# Patient Record
Sex: Male | Born: 1950
Health system: Southern US, Community
[De-identification: ages and names within clinical notes are randomized; demographics above are authoritative.]

## PROBLEM LIST (undated history)

## (undated) DIAGNOSIS — I2510 Atherosclerotic heart disease of native coronary artery without angina pectoris: Secondary | ICD-10-CM

## (undated) DIAGNOSIS — E785 Hyperlipidemia, unspecified: Secondary | ICD-10-CM

## (undated) DIAGNOSIS — S42309A Unspecified fracture of shaft of humerus, unspecified arm, initial encounter for closed fracture: Secondary | ICD-10-CM

## (undated) DIAGNOSIS — R55 Syncope and collapse: Secondary | ICD-10-CM

## (undated) DIAGNOSIS — R001 Bradycardia, unspecified: Secondary | ICD-10-CM

## (undated) DIAGNOSIS — K635 Polyp of colon: Secondary | ICD-10-CM

## (undated) HISTORY — DX: Bradycardia, unspecified: R00.1

## (undated) HISTORY — DX: Unspecified fracture of shaft of humerus, unspecified arm, initial encounter for closed fracture: S42.309A

## (undated) HISTORY — PX: APPENDECTOMY: SHX54

## (undated) HISTORY — DX: Polyp of colon: K63.5

## (undated) HISTORY — DX: Hyperlipidemia, unspecified: E78.5

## (undated) HISTORY — DX: Syncope and collapse: R55

## (undated) HISTORY — DX: Atherosclerotic heart disease of native coronary artery without angina pectoris: I25.10

## (undated) HISTORY — PX: CORONARY ANGIOPLASTY: SHX604

---

## 2002-04-19 ENCOUNTER — Encounter: Payer: Self-pay | Admitting: Internal Medicine

## 2004-09-09 ENCOUNTER — Ambulatory Visit: Payer: Self-pay | Admitting: Internal Medicine

## 2004-09-09 ENCOUNTER — Encounter (INDEPENDENT_AMBULATORY_CARE_PROVIDER_SITE_OTHER): Payer: Self-pay | Admitting: *Deleted

## 2004-09-09 LAB — CONVERTED CEMR LAB: PSA: 0.34 ng/mL

## 2004-09-17 ENCOUNTER — Ambulatory Visit: Payer: Self-pay | Admitting: Internal Medicine

## 2004-10-13 ENCOUNTER — Ambulatory Visit: Payer: Self-pay | Admitting: Internal Medicine

## 2005-09-29 ENCOUNTER — Ambulatory Visit: Payer: Self-pay | Admitting: Internal Medicine

## 2005-09-29 ENCOUNTER — Observation Stay (HOSPITAL_COMMUNITY): Admission: EM | Admit: 2005-09-29 | Discharge: 2005-10-02 | Payer: Self-pay | Admitting: Emergency Medicine

## 2005-09-30 ENCOUNTER — Ambulatory Visit: Payer: Self-pay | Admitting: Internal Medicine

## 2005-09-30 ENCOUNTER — Encounter: Payer: Self-pay | Admitting: Internal Medicine

## 2005-10-13 ENCOUNTER — Encounter (HOSPITAL_COMMUNITY): Admission: RE | Admit: 2005-10-13 | Discharge: 2005-10-30 | Payer: Self-pay | Admitting: Cardiovascular Disease

## 2005-10-24 ENCOUNTER — Ambulatory Visit: Payer: Self-pay | Admitting: Internal Medicine

## 2005-10-25 ENCOUNTER — Ambulatory Visit: Payer: Self-pay | Admitting: Internal Medicine

## 2005-10-26 ENCOUNTER — Ambulatory Visit: Payer: Self-pay | Admitting: Internal Medicine

## 2006-02-13 ENCOUNTER — Ambulatory Visit: Payer: Self-pay | Admitting: Internal Medicine

## 2006-02-13 LAB — CONVERTED CEMR LAB
Albumin: 4.3 g/dL (ref 3.5–5.2)
Alkaline Phosphatase: 73 units/L (ref 39–117)
Total Protein: 7 g/dL (ref 6.0–8.3)

## 2006-05-04 ENCOUNTER — Ambulatory Visit: Payer: Self-pay | Admitting: Internal Medicine

## 2006-08-29 ENCOUNTER — Ambulatory Visit: Payer: Self-pay | Admitting: Internal Medicine

## 2006-09-14 ENCOUNTER — Ambulatory Visit: Payer: Self-pay | Admitting: Internal Medicine

## 2006-09-21 ENCOUNTER — Ambulatory Visit: Payer: Self-pay | Admitting: Internal Medicine

## 2006-09-21 LAB — CONVERTED CEMR LAB
ALT: 28 units/L (ref 0–53)
Chloride: 103 meq/L (ref 96–112)
Cholesterol: 107 mg/dL (ref 0–200)
Creatinine, Ser: 0.9 mg/dL (ref 0.4–1.5)
Glucose, Bld: 95 mg/dL (ref 70–99)
HDL: 31.7 mg/dL — ABNORMAL LOW (ref >39.0)
Potassium: 4.5 meq/L (ref 3.5–5.1)
Sodium: 139 meq/L (ref 135–145)
Triglycerides: 70 mg/dL (ref 0–149)
VLDL: 14 mg/dL (ref 0–40)

## 2007-04-28 ENCOUNTER — Encounter: Payer: Self-pay | Admitting: *Deleted

## 2007-04-28 DIAGNOSIS — Z9089 Acquired absence of other organs: Secondary | ICD-10-CM | POA: Insufficient documentation

## 2007-04-28 DIAGNOSIS — S42309A Unspecified fracture of shaft of humerus, unspecified arm, initial encounter for closed fracture: Secondary | ICD-10-CM | POA: Insufficient documentation

## 2007-04-28 DIAGNOSIS — E785 Hyperlipidemia, unspecified: Secondary | ICD-10-CM | POA: Insufficient documentation

## 2007-04-28 DIAGNOSIS — I2510 Atherosclerotic heart disease of native coronary artery without angina pectoris: Secondary | ICD-10-CM | POA: Insufficient documentation

## 2007-06-14 ENCOUNTER — Ambulatory Visit: Payer: Self-pay | Admitting: Internal Medicine

## 2007-06-26 ENCOUNTER — Telehealth (INDEPENDENT_AMBULATORY_CARE_PROVIDER_SITE_OTHER): Payer: Self-pay | Admitting: *Deleted

## 2007-07-05 ENCOUNTER — Ambulatory Visit: Payer: Self-pay | Admitting: Internal Medicine

## 2007-07-05 ENCOUNTER — Encounter: Payer: Self-pay | Admitting: Internal Medicine

## 2007-07-05 DIAGNOSIS — K635 Polyp of colon: Secondary | ICD-10-CM

## 2007-07-05 HISTORY — DX: Polyp of colon: K63.5

## 2007-07-08 ENCOUNTER — Encounter: Payer: Self-pay | Admitting: Internal Medicine

## 2007-07-10 DIAGNOSIS — Z860100 Personal history of colon polyps, unspecified: Secondary | ICD-10-CM | POA: Insufficient documentation

## 2007-07-26 ENCOUNTER — Encounter: Payer: Self-pay | Admitting: Internal Medicine

## 2007-10-23 ENCOUNTER — Ambulatory Visit: Payer: Self-pay | Admitting: Internal Medicine

## 2007-10-23 LAB — CONVERTED CEMR LAB
Albumin: 4.1 g/dL (ref 3.5–5.2)
BUN: 16 mg/dL (ref 6–23)
Bacteria, UA: NEGATIVE
Basophils Absolute: 0.1 10*3/uL (ref 0.0–0.1)
Bilirubin Urine: NEGATIVE
Chloride: 110 meq/L (ref 96–112)
Cholesterol: 117 mg/dL (ref 0–200)
Creatinine, Ser: 1 mg/dL (ref 0.4–1.5)
Crystals: NEGATIVE
Eosinophils Absolute: 0.5 10*3/uL (ref 0.0–0.7)
Eosinophils Relative: 7.5 % — ABNORMAL HIGH (ref 0.0–5.0)
GFR calc Af Amer: 99 mL/min
GFR calc non Af Amer: 82 mL/min
HCT: 42.4 % (ref 39.0–52.0)
HDL: 33.3 mg/dL — ABNORMAL LOW (ref >39.0)
Hemoglobin, Urine: NEGATIVE
Ketones, ur: NEGATIVE mg/dL
LDL Cholesterol: 68 mg/dL (ref 0–99)
MCHC: 34.9 g/dL (ref 30.0–36.0)
MCV: 93 fL (ref 78.0–100.0)
Monocytes Absolute: 0.7 10*3/uL (ref 0.1–1.0)
Mucus, UA: NEGATIVE
Neutrophils Relative %: 53.6 % (ref 43.0–77.0)
PSA: 0.25 ng/mL (ref 0.10–4.00)
Platelets: 236 10*3/uL (ref 150–400)
Potassium: 4.7 meq/L (ref 3.5–5.1)
RDW: 12.5 % (ref 11.5–14.6)
Total Bilirubin: 1.1 mg/dL (ref 0.3–1.2)
Total Protein, Urine: NEGATIVE mg/dL
Triglycerides: 81 mg/dL (ref 0–149)
Urine Glucose: NEGATIVE mg/dL
Urobilinogen, UA: 0.2 (ref 0.0–1.0)
VLDL: 16 mg/dL (ref 0–40)
WBC: 6.5 10*3/uL (ref 4.5–10.5)

## 2007-10-26 ENCOUNTER — Ambulatory Visit: Payer: Self-pay | Admitting: Internal Medicine

## 2008-02-08 ENCOUNTER — Telehealth: Payer: Self-pay | Admitting: Internal Medicine

## 2008-02-22 ENCOUNTER — Encounter: Payer: Self-pay | Admitting: Internal Medicine

## 2008-04-22 ENCOUNTER — Encounter: Payer: Self-pay | Admitting: Internal Medicine

## 2008-04-22 ENCOUNTER — Ambulatory Visit: Payer: Self-pay | Admitting: Internal Medicine

## 2008-07-08 ENCOUNTER — Telehealth: Payer: Self-pay | Admitting: Internal Medicine

## 2008-07-10 ENCOUNTER — Ambulatory Visit: Payer: Self-pay | Admitting: Internal Medicine

## 2008-07-10 LAB — CONVERTED CEMR LAB
ALT: 24 units/L (ref 0–53)
AST: 27 units/L (ref 0–37)
Albumin: 4.1 g/dL (ref 3.5–5.2)
Cholesterol: 170 mg/dL (ref 0–200)
Direct LDL: 107.3 mg/dL
HDL: 32 mg/dL — ABNORMAL LOW (ref >39.00)
Total Protein: 7.2 g/dL (ref 6.0–8.3)
Triglycerides: 212 mg/dL — ABNORMAL HIGH (ref 0.0–149.0)

## 2008-07-15 ENCOUNTER — Encounter: Payer: Self-pay | Admitting: Internal Medicine

## 2009-02-09 ENCOUNTER — Telehealth: Payer: Self-pay | Admitting: Internal Medicine

## 2009-02-26 ENCOUNTER — Ambulatory Visit: Payer: Self-pay | Admitting: Internal Medicine

## 2009-02-26 LAB — CONVERTED CEMR LAB
AST: 28 units/L (ref 0–37)
Albumin: 4.3 g/dL (ref 3.5–5.2)
Basophils Absolute: 0 10*3/uL (ref 0.0–0.1)
Bilirubin Urine: NEGATIVE
CO2: 30 meq/L (ref 19–32)
Chloride: 105 meq/L (ref 96–112)
Cholesterol: 163 mg/dL (ref 0–200)
GFR calc non Af Amer: 81.07 mL/min (ref >60)
Glucose, Bld: 81 mg/dL (ref 70–99)
HCT: 42.9 % (ref 39.0–52.0)
Hemoglobin: 14.5 g/dL (ref 13.0–17.0)
Ketones, ur: NEGATIVE mg/dL
Leukocytes, UA: NEGATIVE
Lymphs Abs: 1.6 10*3/uL (ref 0.7–4.0)
MCHC: 33.7 g/dL (ref 30.0–36.0)
MCV: 92.8 fL (ref 78.0–100.0)
Monocytes Absolute: 0.6 10*3/uL (ref 0.1–1.0)
Monocytes Relative: 11.1 % (ref 3.0–12.0)
Neutro Abs: 3.3 10*3/uL (ref 1.4–7.7)
Potassium: 4.7 meq/L (ref 3.5–5.1)
RDW: 12.4 % (ref 11.5–14.6)
Sodium: 143 meq/L (ref 135–145)
Specific Gravity, Urine: 1.02 (ref 1.000–1.030)
TSH: 1.12 microintl units/mL (ref 0.35–5.50)
Urobilinogen, UA: 0.2 (ref 0.0–1.0)

## 2009-03-03 ENCOUNTER — Ambulatory Visit: Payer: Self-pay | Admitting: Internal Medicine

## 2009-04-27 ENCOUNTER — Ambulatory Visit: Payer: Self-pay | Admitting: Internal Medicine

## 2009-04-27 DIAGNOSIS — R55 Syncope and collapse: Secondary | ICD-10-CM | POA: Insufficient documentation

## 2009-07-28 ENCOUNTER — Telehealth: Payer: Self-pay | Admitting: Internal Medicine

## 2009-11-06 ENCOUNTER — Encounter: Payer: Self-pay | Admitting: Internal Medicine

## 2009-11-12 ENCOUNTER — Ambulatory Visit: Payer: Self-pay | Admitting: Internal Medicine

## 2010-02-23 ENCOUNTER — Telehealth: Payer: Self-pay | Admitting: Internal Medicine

## 2010-03-01 ENCOUNTER — Telehealth: Payer: Self-pay | Admitting: Internal Medicine

## 2010-03-03 NOTE — Letter (Signed)
Summary: Medical Report/NCDMV  Medical Report/NCDMV   Imported By: Sherian Rein 11/23/2009 13:14:21  _____________________________________________________________________  External Attachment:    Type:   Image     Comment:   External Document

## 2010-03-03 NOTE — Assessment & Plan Note (Signed)
Summary: yearly/sl   Primary Provider:  Illene Regulus  CC:  yearly.  History of Present Illness: Jeremy [name] is seen following stenting of his LAD with a Taxus stent; this occurred in the context of an evaluation for syncope while driving.   His head no exertional chest discomfort. He did have some episodes of chest pain the last 2 weeks which were occurred in the context of a GI syndrome characterized by fever nausea and some loose stool.  Reviewing his laboratories his HDL on Crestor has increased to over 40(hooray)   Current Medications (verified): 1)  Plavix 75 Mg  Tabs (Clopidogrel Bisulfate) .... Take Once Daily 2)  Aspirin 81 Mg  Tabs (Aspirin) .... Take Once Daily 3)  Flonase 50 Mcg/act  Susp (Fluticasone Propionate) .... Use Daily As Directed. 4)  Advil 200 Mg Tabs (Ibuprofen) .... As Needed 5)  Crestor 10 Mg Tabs (Rosuvastatin Calcium) .Marland Kitchen.. 1 Once Daily  Allergies (verified): No Known Drug Allergies  Past History:  Past Medical History: Last updated: 03/03/2009 MYOCARDIAL INFARCTION, HX OF (ICD-412) SYNCOPE, HX OF (ICD-V12.49) PERCUTANEOUS TRANSLUMINAL CORONARY ANGIOPLASTY, HX OF (ICD-V45.82) Hx of FRACTURE, ARM (ICD-818.0) CORONARY ARTERY DISEASE (ICD-414.00) HYPERLIPIDEMIA (ICD-272.4)         Past Surgical History: Last updated: 06/14/2007 APPENDECTOMY, HX OF (ICD-V45.79) PERCUTANEOUS TRANSLUMINAL CORONARY ANGIOPLASTY, HX OF (ICD-V45.82)  Family History: Last updated: 10/26/2007 father- 43: prostate cancer, CAD, lipids, DJD hip and back mother - 86: TIAs, lipid Maternal Uncle with colon cancer Neg- DM  Social History: Last updated: 10/26/2007 Jeremy [name] - [employer] [place] Married ''89 No children Occupation: Forensic scientist  Vital Signs:  Patient profile:   60 year old male Height:      71 inches Weight:      196 pounds BMI:     27.44 Pulse rate:   57 / minute BP sitting:   124 / 72  (left arm) Cuff size:   regular  Vitals  Entered By: Judithe Modest CMA (April 27, 2009 4:33 PM)  Physical Exam  General:  The patient was alert and oriented in no acute distress. HEENT Normal.  Neck veins were flat, carotids were brisk.  Lungs were clear.  Heart sounds were regular without murmurs or gallops.  Abdomen was soft with active bowel sounds. There is no clubbing cyanosis or edema. Skin Warm and dry    EKG  Procedure date:  04/27/2009  Findings:      sinus rhythm at 57 Intervals 0.15/0.10/0.40 Axis LIV R. prime V1 and V2 Otherwise normal  Impression & Recommendations:  Problem # 1:  SYNCOPE (ICD-780.2) no recurrent His updated medication list for this problem includes:    Plavix 75 Mg Tabs (Clopidogrel bisulfate) .Marland Kitchen... Take once daily    Aspirin 81 Mg Tabs (Aspirin) .Marland Kitchen... Take once daily  Problem # 2:  MYOCARDIAL INFARCTION, HX OF (ICD-412) the patient is status post MI and previous stenting. He continues on his Plavix. Lipid management under the care of Dr. Debby Bud has been quite exceptional with his HDL now over 40.  With recent episodes of chest pain strongly suggest GI originnotwithstanding his use of the Levine sign. There've ice let us know if they have any more symptoms going forward is Myoview scan would probably be appropriate His updated medication list for this problem includes:    Plavix 75 Mg Tabs (Clopidogrel bisulfate) .Marland Kitchen... Take once daily    Aspirin 81 Mg Tabs (Aspirin) .Marland Kitchen... Take once daily  Orders: EKG w/ Interpretation (93000)  Problem #  3:  MYOCARDIAL INFARCTION, HX OF (ICD-412)  His updated medication list for this problem includes:    Plavix 75 Mg Tabs (Clopidogrel bisulfate) .Marland Kitchen... Take once daily    Aspirin 81 Mg Tabs (Aspirin) .Marland Kitchen... Take once daily  Orders: EKG w/ Interpretation (93000)

## 2010-03-03 NOTE — Progress Notes (Signed)
Summary: Mail order refill  Phone Note Refill Request Message from:  Fax from Pharmacy on July 28, 2009 11:36 AM  Refills Requested: Medication #1:  FLONASE 50 MCG/ACT  SUSP use daily as directed.   Notes: [facility] Initial call taken by: Lucious Groves,  July 28, 2009 11:36 AM    Prescriptions: FLONASE 50 MCG/ACT  SUSP (FLUTICASONE PROPIONATE) use daily as directed.  #3 x 3   Entered by:   Lucious Groves   Authorized by:   Jacques Navy MD   Signed by:   Lucious Groves on 07/28/2009   Method used:   Faxed to ...       [facility])             , [place]         Ph: [phone]       Fax: [phone]   RxID:   [id number]

## 2010-03-03 NOTE — Assessment & Plan Note (Signed)
Summary: CPX /NWS  #   Vital Signs:  Patient profile:   60 year old male Height:      71 inches Weight:      197 pounds BMI:     27.58 O2 Sat:      97 % on Room air Temp:     98.2 degrees F oral Pulse rate:   63 / minute BP sitting:   122 / 84  (left arm) Cuff size:   regular  Vitals Entered By: Bill Salinas CMA (March 03, 2009 2:18 PM)  O2 Flow:  Room air CC: cpx/ pt will get flu shot and tetanus booster today/ ab   Primary Care Provider:  Illene Regulus  CC:  cpx/ pt will get flu shot and tetanus booster today/ ab.  History of Present Illness: Patient presents for routine medical follow-up. IN the interval he has been good with no intercurrent serious illness, surgery or injury. He continues to adhere to his medical regimen and a healthy life-style despite the gain of a mere 1.25 lbs.    Current Medications (verified): 1)  Plavix 75 Mg  Tabs (Clopidogrel Bisulfate) .... Take Once Daily 2)  Aspirin 81 Mg  Tabs (Aspirin) .... Take Once Daily 3)  Flonase 50 Mcg/act  Susp (Fluticasone Propionate) .... Use Daily As Directed. 4)  Advil 200 Mg Tabs (Ibuprofen) .... As Needed 5)  Crestor 10 Mg Tabs (Rosuvastatin Calcium) .Marland Kitchen.. 1 Once Daily  Allergies (verified): No Known Drug Allergies  Past History:  Family History: Last updated: 10/26/2007 father- 1923: prostate cancer, CAD, lipids, DJD hip and back mother - 65: TIAs, lipid Maternal Uncle with colon cancer Neg- DM  Social History: Last updated: 10/26/2007 Patrick North - [employer] [place] Married ''89 No children Occupation: Forensic scientist  Risk Factors: Alcohol Use: <1 (06/14/2007) Caffeine Use: 0 (10/26/2007) Exercise: yes (10/26/2007)  Risk Factors: Smoking Status: quit (06/14/2007)  Past Medical History: MYOCARDIAL INFARCTION, HX OF (ICD-412) SYNCOPE, HX OF (ICD-V12.49) PERCUTANEOUS TRANSLUMINAL CORONARY ANGIOPLASTY, HX OF (ICD-V45.82) Hx of FRACTURE, ARM (ICD-818.0) CORONARY ARTERY DISEASE  (ICD-414.00) HYPERLIPIDEMIA (ICD-272.4)         Past Surgical History: Reviewed history from 06/14/2007 and no changes required. APPENDECTOMY, HX OF (ICD-V45.79) PERCUTANEOUS TRANSLUMINAL CORONARY ANGIOPLASTY, HX OF (ICD-V45.82)  Family History: Reviewed history from 10/26/2007 and no changes required. father- 64: prostate cancer, CAD, lipids, DJD hip and back mother - 1926: TIAs, lipid Maternal Uncle with colon cancer Neg- DM  Social History: Reviewed history from 10/26/2007 and no changes required. [employer] - [employer] [place] Married ''89 No children Occupation: Forensic scientist  Review of Systems  The patient denies anorexia, fever, weight loss, weight gain, vision loss, decreased hearing, hoarseness, chest pain, syncope, dyspnea on exertion, peripheral edema, prolonged cough, headaches, hemoptysis, abdominal pain, melena, hematochezia, severe indigestion/heartburn, hematuria, incontinence, muscle weakness, suspicious skin lesions, transient blindness, difficulty walking, depression, unusual weight change, abnormal bleeding, enlarged lymph nodes, and testicular masses.    Physical Exam  General:  WNWD white male in no distress Head:  Normocephalic and atraumatic without obvious abnormalities. No apparent alopecia or balding. Eyes:  No corneal or conjunctival inflammation noted. EOMI. Perrla. Funduscopic exam benign, without hemorrhages, exudates or papilledema. Vision grossly normal. Ears:  External ear exam shows no significant lesions or deformities.  Otoscopic examination reveals clear canals, tympanic membranes are intact bilaterally without bulging, retraction, inflammation or discharge. Hearing is grossly normal bilaterally. Nose:  External nasal examination shows no deformity or inflammation. Nasal mucosa are  pink and moist without lesions or exudates. Mouth:  Oral mucosa and oropharynx without lesions or exudates.  Teeth in good repair. Neck:  No  deformities, masses, or tenderness noted.no thyromegaly and no carotid bruits.   Chest Wall:  No deformities, masses, tenderness or gynecomastia noted. Lungs:  Normal respiratory effort, chest expands symmetrically. Lungs are clear to auscultation, no crackles or wheezes. Heart:  Normal rate and regular rhythm. S1 and S2 normal without gallop, murmur, click, rub or other extra sounds. Abdomen:  Bowel sounds positive,abdomen soft and non-tender without masses, organomegaly or hernias noted. Rectal:  No external abnormalities noted. Normal sphincter tone. No rectal masses or tenderness. Prostate:  Prostate gland firm and smooth, no enlargement, nodularity, tenderness, mass, asymmetry or induration. Msk:  normal ROM, no joint tenderness, no joint swelling, no joint warmth, and no joint deformities.   Pulses:  2+ radial and DP pulses Extremities:  No clubbing, cyanosis, edema, or deformity noted with normal full range of motion of all joints.   Neurologic:  No cranial nerve deficits noted. Station and gait are normal. Plantar reflexes are down-going bilaterally. DTRs are symmetrical throughout. Sensory, motor and coordinative functions appear intact. Skin:  turgor normal, color normal, no rashes, and no suspicious lesions.   Cervical Nodes:  no anterior cervical adenopathy and no posterior cervical adenopathy.   Inguinal Nodes:  no R inguinal adenopathy and no L inguinal adenopathy.   Psych:  Cognition and judgment appear intact. Alert and cooperative with normal attention span and concentration. No apparent delusions, illusions, hallucinations   Impression & Recommendations:  Problem # 1:  CORONARY ARTERY DISEASE (ICD-414.00) Very stable with no chest pain or other cardiac complaints. 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  Plan - continue present regimen           follow-up with cardiology as instructed.  His updated medication list for this problem includes:    Plavix 75 Mg Tabs (Clopidogrel  bisulfate) .Marland Kitchen... Take once daily    Aspirin 81 Mg Tabs (Aspirin) .Marland Kitchen... Take once daily  Problem # 2:  HYPERLIPIDEMIA (ICD-272.4)  The following medications were removed from the medication list:    Vytorin 10-80 Mg Tabs (Ezetimibe-simvastatin) .Marland Kitchen... Take once daily His updated medication list for this problem includes:    Crestor 10 Mg Tabs (Rosuvastatin calcium) .Marland Kitchen... 1 once daily  Labs Reviewed: SGOT: 28 (02/26/2009)   SGPT: 24 (02/26/2009)   HDL:42.10 (02/26/2009), 32.00 (07/10/2008)  LDL:98 (02/26/2009), 68 (10/23/2007)  Chol:163 (02/26/2009), 170 (07/10/2008)  Trig:113.0 (02/26/2009), 212.0 (07/10/2008)  Not at goal of LDL 80 or less. This was discussed. He plans to improve life-style adherence and declines a change in medication at this time.  Problem # 3:  Preventive Health Care (ICD-V70.0) Unremarkable interval history. Normal physical exam. Lab results are fine although LDL could be lower. He is provided flu and pneumoniz vaccine. He will consider zostavax next year. Last colonoscopy in '09.  In summary - a very nice gentleman who is medically stable and in good health. He will redouble his efforts regarding exercise, diet and weight management. He will return as needed or 1 year.   Complete Medication List: 1)  Plavix 75 Mg Tabs (Clopidogrel bisulfate) .... Take once daily 2)  Aspirin 81 Mg Tabs (Aspirin) .... Take once daily 3)  Flonase 50 Mcg/act Susp (Fluticasone propionate) .... Use daily as directed. 4)  Advil 200 Mg Tabs (Ibuprofen) .... As needed 5)  Crestor 10 Mg Tabs (Rosuvastatin calcium) .Marland Kitchen.. 1 once daily  Other  Orders: Admin 1st Vaccine (29562) Flu Vaccine 61yrs + (13086) Tdap => 72yrs IM (57846) Admin of Any Addtl Vaccine (96295)   Patient: Jeremy [name] Note: All result statuses are Final unless otherwise noted.  Tests: (1) Lipid Panel (LIPID)   Cholesterol               163 mg/dL                   2-841     ATP III Classification            Desirable:  <  200 mg/dL                    Borderline High:  200 - 239 mg/dL               High:  > = 240 mg/dL   Triglycerides             113.0 mg/dL                 3.2-440.1     Normal:  <150 mg/dL     Borderline High:  027 - 199 mg/dL   HDL                       25.36 mg/dL                 >64.40   VLDL Cholesterol          22.6 mg/dL                  3.4-74.2   LDL Cholesterol           98 mg/dL                    5-95  CHO/HDL Ratio:  CHD Risk                             4                    Men          Women     1/2 Average Risk     3.4          3.3     Average Risk          5.0          4.4     2X Average Risk          9.6          7.1     3X Average Risk          15.0          11.0                           Tests: (2) BMP (METABOL)   Sodium                    143 mEq/L                   135-145   Potassium                 4.7 mEq/L                   3.5-5.1   Chloride  105 mEq/L                   96-112   Carbon Dioxide            30 mEq/L                    19-32   Glucose                   81 mg/dL                    13-08   BUN                       21 mg/dL                    6-57   Creatinine                1.0 mg/dL                   8.4-6.9   Calcium                   9.3 mg/dL                   6.2-95.2   GFR                       81.07 mL/min                >60  Tests: (3) CBC Platelet w/Diff (CBCD)   White Cell Count          5.7 K/uL                    4.5-10.5   Red Cell Count            4.63 Mil/uL                 4.22-5.81   Hemoglobin                14.5 g/dL                   84.1-32.4   Hematocrit                42.9 %                      39.0-52.0   MCV                       92.8 fl                     78.0-100.0   MCHC                      33.7 g/dL                   40.1-02.7   RDW                       12.4 %                      11.5-14.6   Platelet Count            218.0 K/uL  150.0-400.0   Neutrophil %              55.8 %                       43.0-77.0   Lymphocyte %              28.4 %                      12.0-46.0   Monocyte %                11.1 %                      3.0-12.0   Eosinophils%              4.0 %                       0.0-5.0   Basophils %               0.7 %                       0.0-3.0   Neutrophill Absolute      3.3 K/uL                    1.4-7.7   Lymphocyte Absolute       1.6 K/uL                    0.7-4.0   Monocyte Absolute         0.6 K/uL                    0.1-1.0  Eosinophils, Absolute                             0.2 K/uL                    0.0-0.7   Basophils Absolute        0.0 K/uL                    0.0-0.1  Tests: (4) Hepatic/Liver Function Panel (HEPATIC)   Total Bilirubin           1.0 mg/dL                   4.5-4.0   Direct Bilirubin          0.2 mg/dL                   9.8-1.1   Alkaline Phosphatase      80 U/L                      39-117   AST                       28 U/L                      0-37   ALT                       24 U/L  0-53   Total Protein             7.7 g/dL                    1.6-0.7   Albumin                   4.3 g/dL                    3.7-1.0  Tests: (5) TSH (TSH)   FastTSH                   1.12 uIU/mL                 0.35-5.50  Tests: (6) UDip Only (UDIP)   Color                     YELLOW       RANGE:  Yellow;Lt. Yellow   Clarity                   CLEAR                       Clear   Specific Gravity          1.020                       1.000 - 1.030   Urine Ph                  6.0                         5.0-8.0   Protein                   NEGATIVE                    Negative   Urine Glucose             NEGATIVE                    Negative   Ketones                   NEGATIVE                    Negative   Urine Bilirubin           NEGATIVE                    Negative   Blood                     NEGATIVE                    Negative   Urobilinogen              0.2                         0.0 - 1.0   Leukocyte Esterace         NEGATIVE                    Negative   Nitrite                   NEGATIVE  Negative  Tests: (7) Prostate Specific Antigen (PSA)   PSA-Hyb                   0.30 ng/mL                  0.10-4.00  Immunizations Administered:  Tetanus Vaccine:    Vaccine Type: Tdap    Site: right deltoid    Mfr: GlaxoSmithKline    Dose: 0.5 ml    Route: IM    Given by: Ami Bullins CMA    Exp. Date: 03/28/2011    Lot #: [id number]    VIS given: 12/19/06 version given March 03, 2009.    Flu Vaccine Consent Questions     Do you have a history of severe allergic reactions to this vaccine? no    Any prior history of allergic reactions to egg and/or gelatin? no    Do you have a sensitivity to the preservative Thimersol? no    Do you have a past history of Guillan-Barre Syndrome? no    Do you currently have an acute febrile illness? no    Have you ever had a severe reaction to latex? no    Vaccine information given and explained to patient? yes    Are you currently pregnant? no    Lot Number:[id number]   Exp Date:07/30/2009   Site Given  Left Deltoid IMflu

## 2010-03-03 NOTE — Letter (Signed)
Summary: Medical Review forms/NCDMV  Medical Review forms/NCDMV   Imported By: Sherian Rein 11/16/2009 15:08:00  _____________________________________________________________________  External Attachment:    Type:   Image     Comment:   External Document

## 2010-03-03 NOTE — Progress Notes (Signed)
Summary: REFILLS  Phone Note Refill Request Message from:  Pharmacy  Refills Requested: Medication #1:  FLONASE 50 MCG/ACT  SUSP use daily as directed.  Medication #2:  PLAVIX 75 MG  TABS Take once daily Also wants crestor 10mg  (NOT IN EMR)  OK for 1 year to go to [facility]? REF # [id number]  Initial call taken by: Lamar Sprinkles, CMA,  February 09, 2009 1:37 PM  Follow-up for Phone Call        OK for refills as needed  Follow-up by: Jacques Navy MD,  February 09, 2009 2:37 PM    New/Updated Medications: CRESTOR 10 MG TABS (ROSUVASTATIN CALCIUM) 1 once daily Prescriptions: PLAVIX 75 MG  TABS (CLOPIDOGREL BISULFATE) Take once daily  #90 x 3   Entered by:   Lamar Sprinkles, CMA   Authorized by:   Jacques Navy MD   Signed by:   Lamar Sprinkles, CMA on 02/09/2009   Method used:   Faxed to ...       [facility])             , [place]         Ph: [phone]       Fax: [phone]   RxID:   [id number] CRESTOR 10 MG TABS (ROSUVASTATIN CALCIUM) 1 once daily  #90 x 3   Entered by:   Lamar Sprinkles, CMA   Authorized by:   Jacques Navy MD   Signed by:   Lamar Sprinkles, CMA on 02/09/2009   Method used:   Faxed to ...       [facility])             , [place]         Ph: [phone]       Fax: [phone]   RxID:   [id number]

## 2010-03-04 NOTE — Progress Notes (Signed)
Summary: REFILL  Phone Note Call from Patient   Caller: Patient Summary of Call: REQUEST REFILL BE SENT AGAIN TO [facility], DID NOT RECEIVE LAST REFILL.... CRESTOR 10MG  AND PLAVIX 75MG  Initial call taken by: Migdalia Dk,  February 23, 2010 2:47 PM    Prescriptions: CRESTOR 10 MG TABS (ROSUVASTATIN CALCIUM) 1 once daily  #90 Tablet x 3   Entered by:   Ami Bullins CMA   Authorized by:   Jacques Navy MD   Signed by:   Bill Salinas CMA on 02/24/2010   Method used:   Faxed to ...       [facility] ([facility])             , [place]         Ph: [phone]       Fax: [phone]   RxID:   [id number] PLAVIX 75 MG  TABS (CLOPIDOGREL BISULFATE) Take once daily  #90 Tablet x 3   Entered by:   Ami Bullins CMA   Authorized by:   Jacques Navy MD   Signed by:   Bill Salinas CMA on 02/24/2010   Method used:   Faxed to ...       [facility])             , [place]         Ph: [phone]       Fax: [phone]   RxID:   [id number]

## 2010-03-10 NOTE — Progress Notes (Signed)
Summary: REFILL  Phone Note Call from Patient Call back at Home Phone [phone]   Caller: Spouse Reason for Call: Talk to Nurse Summary of Call: PLEASE RESEND CRESTOR TO [facility], THEY HAVE YET TO RECEIVE Initial call taken by: Migdalia Dk,  March 01, 2010 4:07 PM    Prescriptions: CRESTOR 10 MG TABS (ROSUVASTATIN CALCIUM) 1 once daily  #90 Tablet x 3   Entered by:   Ami Bullins CMA   Authorized by:   Jacques Navy MD   Signed by:   Bill Salinas CMA on 03/02/2010   Method used:   Faxed to ...       [facility])             , [place]         Ph: [phone]       Fax: [phone]   RxID:   [id number]

## 2010-03-18 ENCOUNTER — Other Ambulatory Visit: Payer: Self-pay | Admitting: Internal Medicine

## 2010-03-18 ENCOUNTER — Other Ambulatory Visit: Payer: 59

## 2010-03-18 ENCOUNTER — Encounter (INDEPENDENT_AMBULATORY_CARE_PROVIDER_SITE_OTHER): Payer: Self-pay | Admitting: *Deleted

## 2010-03-18 DIAGNOSIS — Z0000 Encounter for general adult medical examination without abnormal findings: Secondary | ICD-10-CM

## 2010-03-18 DIAGNOSIS — Z0389 Encounter for observation for other suspected diseases and conditions ruled out: Secondary | ICD-10-CM

## 2010-03-18 LAB — BASIC METABOLIC PANEL
BUN: 20 mg/dL (ref 6–23)
Calcium: 9.3 mg/dL (ref 8.4–10.5)
GFR: 91.23 mL/min (ref >60.00)
Glucose, Bld: 90 mg/dL (ref 70–99)

## 2010-03-18 LAB — LIPID PANEL
HDL: 39.6 mg/dL (ref >39.00)
Total CHOL/HDL Ratio: 4
VLDL: 18.4 mg/dL (ref 0.0–40.0)

## 2010-03-18 LAB — HEPATIC FUNCTION PANEL
AST: 25 U/L (ref 0–37)
Alkaline Phosphatase: 72 U/L (ref 39–117)
Total Bilirubin: 0.9 mg/dL (ref 0.3–1.2)

## 2010-03-18 LAB — URINALYSIS
Bilirubin Urine: NEGATIVE
Hgb urine dipstick: NEGATIVE
Ketones, ur: NEGATIVE
Nitrite: NEGATIVE
Total Protein, Urine: NEGATIVE

## 2010-03-18 LAB — CBC WITH DIFFERENTIAL/PLATELET
Basophils Absolute: 0 10*3/uL (ref 0.0–0.1)
Lymphocytes Relative: 29 % (ref 12.0–46.0)
Monocytes Relative: 10.5 % (ref 3.0–12.0)
Platelets: 233 10*3/uL (ref 150.0–400.0)
RDW: 13.5 % (ref 11.5–14.6)

## 2010-03-18 LAB — TSH: TSH: 1.54 u[IU]/mL (ref 0.35–5.50)

## 2010-03-25 ENCOUNTER — Encounter: Payer: Self-pay | Admitting: Internal Medicine

## 2010-04-14 ENCOUNTER — Encounter (INDEPENDENT_AMBULATORY_CARE_PROVIDER_SITE_OTHER): Payer: 59 | Admitting: Internal Medicine

## 2010-04-14 ENCOUNTER — Encounter: Payer: Self-pay | Admitting: Internal Medicine

## 2010-04-14 DIAGNOSIS — Z0000 Encounter for general adult medical examination without abnormal findings: Secondary | ICD-10-CM

## 2010-04-29 NOTE — Assessment & Plan Note (Signed)
Summary: cpx/united hc/#/cd   Vital Signs:  Patient profile:   60 year old male Height:      71 inches Weight:      193 pounds BMI:     27.02 O2 Sat:      96 % on Room air Temp:     97.8 degrees F oral Pulse rate:   67 / minute BP sitting:   120 / 72  (left arm) Cuff size:   regular  Vitals Entered By: Jeremy [name] CMA (April 14, 2010 10:50 AM)  O2 Flow:  Room air CC: cpx/ ab  Vision Screening:      Vision Comments: Eye Exam 5 months ago with slight change in prescriptions   Primary Care Provider:  Illene [name]  CC:  cpx/ ab.  History of Present Illness: [name] presents for routine medical followup. In the interval since his last visit he has been feeling well. He has had no major medical illnes, no injury and no surgery. He has been living well with less dietary restraint than in the past. He has kept up with cardilogy for rouitne follow-up of his CAD.  He is 100% independent in ADLs. He has had no falls and has no fall risk. He continues to work full-time and has no plans to do otherwise. He is very much cognitively engaged and in tact. He has no signs or symptoms of depression.   Current Medications (verified): 1)  Plavix 75 Mg  Tabs (Clopidogrel Bisulfate) .... Take Once Daily 2)  Aspirin 81 Mg  Tabs (Aspirin) .... Take Once Daily 3)  Flonase 50 Mcg/act  Susp (Fluticasone Propionate) .... Use Daily As Directed. 4)  Advil 200 Mg Tabs (Ibuprofen) .... As Needed 5)  Crestor 10 Mg Tabs (Rosuvastatin Calcium) .Marland Kitchen.. 1 Once Daily  Allergies (verified): No Known Drug Allergies  Past History:  Past Medical History: Last updated: 03/03/2009 MYOCARDIAL INFARCTION, HX OF (ICD-412) SYNCOPE, HX OF (ICD-V12.49) PERCUTANEOUS TRANSLUMINAL CORONARY ANGIOPLASTY, HX OF (ICD-V45.82) Hx of FRACTURE, ARM (ICD-818.0) CORONARY ARTERY DISEASE (ICD-414.00) HYPERLIPIDEMIA (ICD-272.4)         Past Surgical History: Last updated: 06/14/2007 APPENDECTOMY, HX OF  (ICD-V45.79) PERCUTANEOUS TRANSLUMINAL CORONARY ANGIOPLASTY, HX OF (ICD-V45.82)  Family History: Last updated: 10/26/2007 father- 79: prostate cancer, CAD, lipids, DJD hip and back mother - 75: TIAs, lipid Maternal Uncle with colon cancer Neg- DM  Social History: Last updated: 10/26/2007 [employer] [place] Married ''89 No children Occupation: Forensic scientist  Review of Systems  The patient denies anorexia, fever, weight loss, weight gain, vision loss, decreased hearing, hoarseness, chest pain, syncope, dyspnea on exertion, peripheral edema, prolonged cough, headaches, abdominal pain, severe indigestion/heartburn, hematuria, incontinence, muscle weakness, transient blindness, difficulty walking, depression, unusual weight change, abnormal bleeding, enlarged lymph nodes, and testicular masses.    Physical Exam  General:  Well-developed,well-nourished,in no acute distress; alert,appropriate and cooperative throughout examination Head:  Normocephalic and atraumatic without obvious abnormalities. No apparent alopecia or balding. Eyes:  vision grossly intact, pupils equal, pupils round, and corneas and lenses clear.   Ears:  External ear exam shows no significant lesions or deformities.  Otoscopic examination reveals clear canals, tympanic membranes are intact bilaterally without bulging, retraction, inflammation or discharge. Hearing is grossly normal bilaterally. Nose:  no external deformity and no external erythema.   Mouth:  Oral mucosa and oropharynx without lesions or exudates.  Teeth in good repair. Neck:  supple, full ROM, no thyromegaly, and no carotid bruits.   Chest Wall:  No deformities, masses, tenderness or gynecomastia noted. Lungs:  Normal respiratory effort, chest expands symmetrically. Lungs are clear to auscultation, no crackles or wheezes. Heart:  Normal rate and regular rhythm. S1 and S2 normal without gallop, murmur, click, rub or other extra  sounds. Abdomen:  soft, non-tender, normal bowel sounds, and no guarding.   Prostate:  deferred Msk:  normal ROM, no joint tenderness, no joint swelling, no joint warmth, and no joint instability.   Pulses:  2+ radial Extremities:  No clubbing, cyanosis, edema, or deformity noted with normal full range of motion of all joints.   Neurologic:  alert & oriented X3, cranial nerves II-XII intact, strength normal in all extremities, gait normal, and DTRs symmetrical and normal.   Skin:  turgor normal, color normal, no rashes, and no ulcerations.   Cervical Nodes:  no anterior cervical adenopathy and no posterior cervical adenopathy.   Psych:  Oriented X3, memory intact for recent and remote, normally interactive, and good eye contact.     Impression & Recommendations:  Problem # 1:  MYOCARDIAL INFARCTION, HX OF (ICD-412) [name] is doing wel with no complaints of chest pain or other cardiac symptoms. He does follow-up periodically with his cardiolgist. He does work at risk factor modification - see below. He appears to very stable in this regard.  His updated medication list for this problem includes:    Plavix 75 Mg Tabs (Clopidogrel bisulfate) .Marland Kitchen... Take once daily    Aspirin 81 Mg Tabs (Aspirin) .Marland Kitchen... Take once daily  Problem # 2:  HYPERLIPIDEMIA (ICD-272.4)  His updated medication list for this problem includes:    Crestor 10 Mg Tabs (Rosuvastatin calcium) .Marland Kitchen... 1 once daily  Labs Reviewed: SGOT: 25 (03/18/2010)   SGPT: 25 (03/18/2010)   HDL:39.60 (03/18/2010), 42.10 (02/26/2009)  LDL:101 (03/18/2010), 98 (16/11/9602)  Chol:159 (03/18/2010), 163 (02/26/2009)  Trig:92.0 (03/18/2010), 113.0 (02/26/2009)  LDL  is above goal of 80 or less! He does admit to dietary indescretion and prefers to work on diet than to increase medication.  Plan - continue crestor           better diet control           follow-up lab in 3 months with adjustments as indicate.  Problem # 3:  Preventive Health  Care (ICD-V70.0) Un rmarkable interval history. Physical exam is normal. Lab results are normal except for elevated LDL. He is current with colorectal cancer screening with last exam in '09. He is current with tetnus immunization and is a candidate for pneumonia vaccine and shingles vaccine.  In summary - a very nice man who is medically stable who will work on cholesterol managment over the next three months before being retested. He will otherwise return in 1 year or as needed.  Complete Medication List: 1)  Plavix 75 Mg Tabs (Clopidogrel bisulfate) .... Take once daily 2)  Aspirin 81 Mg Tabs (Aspirin) .... Take once daily 3)  Flonase 50 Mcg/act Susp (Fluticasone propionate) .... Use daily as directed. 4)  Advil 200 Mg Tabs (Ibuprofen) .... As needed 5)  Crestor 10 Mg Tabs (Rosuvastatin calcium) .Marland Kitchen.. 1 once daily  Patient: Jeremy [name] Note: All result statuses are Final unless otherwise noted.  Tests: (1) BMP (METABOL)   Sodium                    141 mEq/L                   135-145  Potassium                 4.7 mEq/L                   3.5-5.1   Chloride                  105 mEq/L                   96-112   Carbon Dioxide            29 mEq/L                    19-32   Glucose                   90 mg/dL                    16-10   BUN                       20 mg/dL                    9-60   Creatinine                0.9 mg/dL                   4.5-4.0   Calcium                   9.3 mg/dL                   9.8-11.9   GFR                       91.23 mL/min                >60.00  Tests: (2) CBC Platelet w/Diff (CBCD)   White Cell Count          6.6 K/uL                    4.5-10.5   Red Cell Count            4.62 Mil/uL                 4.22-5.81   Hemoglobin                14.4 g/dL                   14.7-82.9   Hematocrit                42.2 %                      39.0-52.0   MCV                       91.5 fl                     78.0-100.0   MCHC                      34.0 g/dL                    56.2-13.0   RDW  13.5 %                      11.5-14.6   Platelet Count            233.0 K/uL                  150.0-400.0   Neutrophil %              53.9 %                      43.0-77.0   Lymphocyte %              29.0 %                      12.0-46.0   Monocyte %                10.5 %                      3.0-12.0   Eosinophils%         [H]  6.1 %                       0.0-5.0   Basophils %               0.5 %                       0.0-3.0   Neutrophill Absolute      3.5 K/uL                    1.4-7.7   Lymphocyte Absolute       1.9 K/uL                    0.7-4.0   Monocyte Absolute         0.7 K/uL                    0.1-1.0  Eosinophils, Absolute                             0.4 K/uL                    0.0-0.7   Basophils Absolute        0.0 K/uL                    0.0-0.1  Tests: (3) Hepatic/Liver Function Panel (HEPATIC)   Total Bilirubin           0.9 mg/dL                   5.7-8.4   Direct Bilirubin          0.1 mg/dL                   6.9-6.2   Alkaline Phosphatase      72 U/L                      39-117   AST                       25 U/L  0-37   ALT                       25 U/L                      0-53   Total Protein             6.7 g/dL                    4.0-9.8   Albumin                   4.0 g/dL                    1.1-9.1  Tests: (4) TSH (TSH)   FastTSH                   1.54 uIU/mL                 0.35-5.50  Tests: (5) Lipid Panel (LIPID)   Cholesterol               159 mg/dL                   4-782     ATP III Classification            Desirable:  < 200 mg/dL                    Borderline High:  200 - 239 mg/dL               High:  > = 240 mg/dL   Triglycerides             92.0 mg/dL                  9.5-621.3     Normal:  <150 mg/dL     Borderline High:  086 - 199 mg/dL   HDL                       57.84 mg/dL                 >69.62   VLDL Cholesterol          18.4 mg/dL                  9.5-28.4   LDL  Cholesterol      [H]  101 mg/dL                   1-32  CHO/HDL Ratio:  CHD Risk                             4                    Men          Women     1/2 Average Risk     3.4          3.3     Average Risk          5.0          4.4     2X Average Risk          9.6          7.1     3X Average Risk  15.0          11.0                           Tests: (6) UDip Only (UDIP)   Color                     YELLOW       RANGE:  Yellow;Lt. Yellow   Clarity                   CLEAR                       Clear   Specific Gravity          1.025                       1.000 - 1.030   Urine Ph                  6.0                         5.0-8.0   Protein                   NEGATIVE                    Negative   Urine Glucose             NEGATIVE                    Negative   Ketones                   NEGATIVE                    Negative   Urine Bilirubin           NEGATIVE                    Negative   Blood                     NEGATIVE                    Negative   Urobilinogen              1.0                         0.0 - 1.0   Leukocyte Esterace        NEGATIVE                    Negative   Nitrite                   NEGATIVE                    Negative  Tests: (7) Prostate Specific Antigen (PSA)   PSA-Hyb                   0.28 ng/mL                  0.10-4.00  Orders Added: 1)  Est. Patient 40-64 years [[id number]]

## 2010-05-22 ENCOUNTER — Encounter: Payer: Self-pay | Admitting: Internal Medicine

## 2010-05-25 ENCOUNTER — Encounter: Payer: Self-pay | Admitting: Internal Medicine

## 2010-05-25 ENCOUNTER — Ambulatory Visit (INDEPENDENT_AMBULATORY_CARE_PROVIDER_SITE_OTHER): Payer: 59 | Admitting: Internal Medicine

## 2010-05-25 DIAGNOSIS — E785 Hyperlipidemia, unspecified: Secondary | ICD-10-CM

## 2010-05-25 DIAGNOSIS — I2510 Atherosclerotic heart disease of native coronary artery without angina pectoris: Secondary | ICD-10-CM

## 2010-05-25 DIAGNOSIS — I498 Other specified cardiac arrhythmias: Secondary | ICD-10-CM

## 2010-05-25 DIAGNOSIS — R001 Bradycardia, unspecified: Secondary | ICD-10-CM | POA: Insufficient documentation

## 2010-05-25 NOTE — Progress Notes (Signed)
HPI: Jeremy [name] is a 60 y.o. male Seen in followup for syncope which is now quite acid. He was also found to have coronary artery disease and is status post Taxus stenting for his LAD.  He has a dyslipidemia with issues of low HDL. We switched him to Crestor and his HDL is on the low normal range at 40 measured in February. However, with the discontinuation of his Vytorin, his LDL is running at 100. There is a some confusion apparently as to whether Dr. Cherylynn Ridges I have been managing this. I have been deferring to him and according to the family he may have been deferring to me.  The patient denies chest pain, shortness of breath, nocturnal dyspnea, orthopnea or peripheral edema.  There have been no palpitations, lightheadedness or syncope.   He is exercising 3 mornings a week. He has commitments the other mornings.    Current Outpatient Prescriptions  Medication Sig Dispense Refill  . aspirin 81 MG tablet Take 81 mg by mouth daily.        . clopidogrel (PLAVIX) 75 MG tablet Take 75 mg by mouth daily.        . fluticasone (FLONASE) 50 MCG/ACT nasal spray 2 sprays by Nasal route as directed.        . rosuvastatin (CRESTOR) 10 MG tablet Take 10 mg by mouth daily.        Marland Kitchen DISCONTD: ibuprofen (ADVIL) 200 MG tablet Take 200 mg by mouth as needed.          No Known Allergies  Past Medical History  Diagnosis Date  . Old myocardial infarction   . Syncope   . Postsurgical percutaneous transluminal coronary angioplasty status   . Ill-defined closed fractures of upper limb   . Coronary atherosclerosis of unspecified type of vessel, native or graft     Past Surgical History  Procedure Date  . Appendectomy   . Coronary angioplasty     Family History  Problem Relation Age of Onset  . Prostate cancer Father   . Heart attack Paternal Grandmother     History   Social History  . Marital Status: Married    Spouse Name: N/A    Number of Children: N/A  . Years of Education: N/A     Occupational History  . Not on file.   Social History Main Topics  . Smoking status: Former Games developer  . Smokeless tobacco: Not on file  . Alcohol Use: Yes     occasional  . Drug Use: No  . Sexually Active: Not on file   Other Topics Concern  . Not on file   Social History Narrative  . No narrative on file    Fourteen point review of systems was negative except as noted in HPI and PMH   PHYSICAL EXAMINATION  Blood pressure 120/72, pulse 52, height 5\' 10"  (1.778 m), weight 195 lb 12.8 oz (88.814 kg).   Well developed and nourished older  male in no acute distress HENT normal Neck supple with JVP-flat Carotids brisk and full without bruits Back without scoliosis or kyphosis Clear Regular rate and rhythm, no murmurs or gallops Abd-soft with active BS without hepatomegaly or midline pulsation Femoral pulses 2+ distal pulses intact No Clubbing cyanosis edema Skin-warm and dry LN-neg submandibular and supraclavicular A & Oriented CN 3-12 normal  Grossly normal sensory and motor function Affect engaging . ECG today demonstrates sinus rhythm at 49 Intervals 0.16 5.10 6.43 Axis is 61

## 2010-05-25 NOTE — Assessment & Plan Note (Signed)
Patient is stable on current medications

## 2010-05-25 NOTE — Assessment & Plan Note (Signed)
Patient has a dyslipidemia. We spent about 30 minutes discussing his cholesterol issues. Pain he increase his Crestor from 10-20 mg a day and into await further data regarding heart outcome benefits related to ezatamide in the event that we can't achieve a goal of 70 on his higher dose of Crestor. His HDL has reached a low normal levels

## 2010-05-25 NOTE — Assessment & Plan Note (Signed)
Asymptomatic we'll follow

## 2010-05-25 NOTE — Patient Instructions (Addendum)
Your physician recommends that you schedule a follow-up appointment in: 4 MONTHS WITH DR Graciela Husbands.  Your physician has recommended you make the following change in your medication:  INCREASE CRESTOR TO 20 MG

## 2010-06-15 NOTE — Assessment & Plan Note (Signed)
[facility]                         ELECTROPHYSIOLOGY OFFICE NOTE   NAME:ALLERKruze, Atchley                        MRN:          [id number]  DATE:08/29/2006                            DOB:          05-18-1950    [name] is seen following stenting of his LAD with a Taxus stent; this  occurred in the context of an evaluation for syncope while driving.   He is otherwise doing well without complaints.   Review of his labs from last September demonstrated his HDL was 30.   MEDICATIONS:  1. Vytorin 10/80.  2. Plavix.  3. Aspirin 81 mg.  4. Flonase.   PHYSICAL EXAMINATION:  His blood pressure today was 112/78, his pulse  was 60.  LUNGS:  Clear.  HEART:  Sounds were regular.  EXTREMITIES:  Without edema.  His weight was 188, which apparently is stable.   ELECTROCARDIOGRAM:  Dated today demonstrated sinus rhythm at 68 with  intervals of 0.15/0.9/0.38.  There was an R-prime in lead V1.   IMPRESSION:  1. Syncope -- quiescent.  2. Coronary artery disease with left anterior descending stenting with      a Taxus stent.  3. Plavix for #2.  4. Dyslipidemia with ongoing low HDL.  5. I have encouraged him with non-medicinal interventions for his HDL      at this point.   We will see him again in 18 months.  He will continue on Plavix.     Duke Salvia, MD, [facility]  Electronically Signed    SCK/MedQ  DD: 08/29/2006  DT: 08/30/2006  Job #: [id number]

## 2010-06-15 NOTE — Assessment & Plan Note (Signed)
[facility]                           PRIMARY CARE OFFICE NOTE   ALAM, GUTERREZ                        MRN:          [id number]  DATE:09/14/2006                            DOB:          08/02/1950    [name] is a 60 year old gentleman followed for hyperlipidemia, CAD  status post PCI with stenting of the RCA.  He was last seen in primary  care May 04, 2006 and was stable at that time.  Last cardiology visit  August 29, 2006 with  Dr. Berton Mount at which time he was likewise stable.   The patient reports he is feeling well and doing well with no active  medical complaints at this time.  We did discuss he has some sleep  duration issues where he has been waking up early and having difficulty  returning to sleep.   PAST MEDICAL HISTORY:  1. Usual childhood disease.  2. Asthmatic bronchitis.  3. Hyperlipidemia.  4. CAD status post stenting of the RCA.   PAST SURGICAL HISTORY:  The patient had a splenectomy at age 40.   TRAUMA:  The patient had a fracture in her arm as an adult.   CURRENT MEDICATIONS:  1. Vytorin 10/80 once daily.  2. Plavix 75 mg daily.  3. Aspirin 81 mg daily.  4. Flonase 1 spray in each nostril daily.  5. Advil p.r.n.  6. Primatene Mist p.r.n.  7. Sudafed generic p.r.n.   FAMILY HISTORY:  Father with prostate cancer.  Mother with degenerative  joint disease.  Paternal grandmother with MI.   SOCIAL HISTORY:  The patient is married 19 years.  He works for [employer].  His marriage is in good health.   CHART REVIEW:  The patient's last colonoscopy was March 2004.  Last EKG  on August 29, 2006 was normal sinus rhythm with no abnormalities noted.   REVIEW OF SYSTEMS:  No fever, sweats or chills.  The patient has a sleep  duration insomnia as noted.  OPHTHALMOLOGIC:  No change in vision.  Was  last examined less than six months ago.  No ENT complaints.  No  cardiovascular, respiratory, GI or GU complaints.   PHYSICAL EXAMINATION:  VITAL SIGNS:  Temperature 97, blood pressure  114/71, pulse 59, weight 191.  GENERAL APPEARANCE:  This is a well-nourished, well-developed athletic-  appearing gentleman in no acute distress.  He is mildly overweight.  HEENT:  Normocephalic, atraumatic.  ENT unremarkable.  Oropharynx with  native dentition in good repair.  No buccal or palatal lesions were  noted.  Posterior pharynx was clear.  Conjunctivae, sclerae were clear.  PERRLA, EOMI.  Funduscopic exam was unremarkable.  NECK:  Supple without thyromegaly.  No adenopathy was noted in the  cervical or supraclavicular regions.  CHEST:  With CVA tenderness.  LUNGS:  Clear to auscultation and percussion.  CARDIOVASCULAR:  2+ radial pulses.  No JVD, no carotid bruits.  He had a  quiet precordium with a regular rate and rhythm without murmurs, rubs or  gallops.  ABDOMEN:  Soft with no guarding, no rebound.  No organosplenomegaly was  noted.  GU:  Genitalia was normal.  RECTAL:  Normal sphincter tone was noted.  Prostate was normal size and  contour without nodules.  EXTREMITIES:  Without clubbing, cyanosis or edema.  No deformities were  noted.   LABORATORY DATA:  Laboratories ordered and pending.     Rosalyn Gess Norins, MD  Electronically Signed    MEN/MedQ  DD: 09/15/2006  DT: 09/15/2006  Job #: [id number]

## 2010-06-15 NOTE — Assessment & Plan Note (Signed)
[facility]                           PRIMARY CARE OFFICE NOTE   NAME:ALLERDaeveon, Zweber                        MRN:          [id number]  DATE:09/14/2006                            DOB:          [birth year]    DICTATION CUT-OFF:   NOTATION:  I got cut off with laboratory ordered and pending.  The lipid  panel, AST, ALT, PSA and basic metabolic panel.   ASSESSMENT/PLAN:  1. Lipids:  The patient has been previously well-controlled with an      LDL of 55, HDL 30.4.  Will recheck labs at this time and adjust      medications as indicated, although I suspect he is still well-      controlled.  2. Coronary artery disease:  The patient is stable.  He has had close      followup with cardiology.  At this point no further evaluation is      indicated except to continue with risk management.  3. Health maintenance:  The patient is currently undergoing colorectal      cancer screening.  He would be a candidate for a DP booster.  He      would be a candidate at age 78 for a pneumonia vaccine and      Zostavax.   DISCUSSION:  A very pleasant gentleman who seems to be medically stable  at this time.  He will be notified by telemetry as to his laboratory  results.     Rosalyn Gess Norins, MD  Electronically Signed    MEN/MedQ  DD: 09/15/2006  DT: 09/15/2006  Job #: [id number]   cc:   Patient

## 2010-06-18 NOTE — Letter (Signed)
July 05, 2006     RE:  Jeremy [name], Jeremy [name]  MRN:  [id number]  /  DOB:  05-06-1950   To Whom It May Concern:   Jeremy [name] was hospitalized in the summer of 2007 following a syncopal  episode. He was driving in his car. He felt the need to urinate, became  nauseated and lost consciousness. Subsequently, he underwent an  extensive cardiac evaluation demonstrating normal left ventricular  function and coronary artery disease for which he underwent stenting. He  has done well since that time without recurrent symptoms.   Based on the hypothesized mechanism, the interval since his last episode  and the interventions undertaken in the summer of 2007, I suspect that  Jeremy [name] is at very, very low risk for recurrent syncope.   If you have any further questions, please do not hesitate to contact me.    Sincerely,      Duke Salvia, MD, [facility]  Electronically Signed    SCK/MedQ  DD: 07/05/2006  DT: 07/05/2006  Job #: [id number]
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
## 2010-06-18 NOTE — Assessment & Plan Note (Signed)
[facility]                           ELECTROPHYSIOLOGY OFFICE NOTE   NAME:ALLERPao, Haffey                        MRN:          [id number]  DATE:10/26/2005                            DOB:          1951/01/05   [name] is seen following a syncopal episode that was associated with a  troponin bump.  He underwent catheterization demonstrating a high-grade  right lesion and an IVUS LAD lesion, both of which were submitted to PCI.  He received drug-eluting stents on the right.  He has done well.  He has had  no recurrent problems and is back active, except for driving.   CURRENT MEDICATIONS:  1. Vytorin 10/80 up from 10/40.  2. Plavix 75 mg.  3. Aspirin 325 mg.  4. Flonase.   ALLERGIES:  He has no known drug allergies.   EXAMINATION:  His blood pressure was normal, although I do not know what I  did with the recording of it.  His heart rate was 59.  HEENT:  Exam demonstrated no icterus or xanthomata.  NECK:  Veins were flat.  Carotids were brisk and full bilaterally without  bruits.  HEART:  Sounds were regular without murmurs or gallops.  EXTREMITIES:  Without edema.   IMPRESSION:  1. Non-ST-segment-elevation myocardial infarction/acute coronary syndrome.  2. Status post percutaneous coronary intervention of the right coronary      artery and the left anterior descending.  3. Normal left ventricular function.  4. Syncope, temporally, related to #1.  5. Dyslipidemia.   [name] is doing alright.  We will plan at this point to let him resume  his activities including driving.  His HDL is low and I did encourage him to  drink some wine, although not with driving,  nuts and exercise, and he will follow up with Dr. Debby Bud for reassessment of  his HDL.  We will see him again in 4 months' time.            ______________________________  Duke Salvia, MD, [facility]    SCK/MedQ  DD:  10/26/2005 DT:  10/28/2005 Job #:  [id number]   cc:   Rosalyn Gess. Norins, MD
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
## 2010-06-18 NOTE — Discharge Summary (Addendum)
NAMEMACCOY, HAUBNER NO.:  [id number]   MEDICAL RECORD NO.:  [id number]          PATIENT TYPE:  OBV   LOCATION:  4731                         FACILITY:  [facility]   PHYSICIAN:  Corwin Levins, MD      DATE OF BIRTH:  04-17-1950   DATE OF ADMISSION:  09/29/2005  DATE OF DISCHARGE:  10/02/2005                                 DISCHARGE SUMMARY   DISCHARGE DIAGNOSES:  1. Syncope.  2. Coronary artery disease.  3. Status post percutaneous transluminal coronary angioplasty with      stenting of the right coronary artery this admission.  4. Asthmatic bronchitis.  5. Hyperlipidemia.  6. Status post splenectomy ____ QA MARKER: 90 ____ traum   CONSULTS:  Our EP Cardiology.   PROCEDURES:  Cardiac catheterization September 30, 2005 with high-grade RCA  disease, moderate LAD disease nonobstructive disease of the left circumflex,  and preserved LV function.  Status post PTCA of the RCA with 85%-0% noted.  2-D echocardiogram September 30, 2005 with EF 65%, aortic valve thickness mild  but no evidence for aortic valve stenosis by Doppler.   HOSPITAL COURSE:  On post catheterization Mr. Sessler did well with monitoring  the groin site, otherwise stable.  As he was ambulatory and doing well  without significant chest discomfort, arrhythmia, or other new problems it  was felt he had gained maximum benefit from this hospitalization and is to  be discharged home.  It was noted his LDL cholesterol was elevated at 97 and  his Vytorin was therefore increased in dosage.  All other cardiac enzymes  post catheterization were negative.  This patient discharged to home in good  condition, no dietary, activity, or restrictions besides low sodium diet.   DISCHARGE MEDICATIONS:  1. Plavix 75 mg, 1 p.o. q. day for 1 year.  2. Vytorin 10/80, 1 p.o. q. day.  3. Aspirin 325 mg, 1 p.o. q. day.   FOLLOWUP:  He is to follow up with Dr. Illene Regulus in 2-3 weeks as well  as Dr. Graciela Husbands, date to be  determined.  Thanks very much.           ______________________________  Corwin Levins, MD     JWJ/MEDQ  D:  10/02/2005  T:  10/02/2005  Job:  [id number]

## 2010-06-18 NOTE — Assessment & Plan Note (Signed)
[facility]                         ELECTROPHYSIOLOGY OFFICE NOTE   NAME:ALLERCicero, Noy                        MRN:          [id number]  DATE:02/13/2006                            DOB:          1950/10/29    Mr. Lennox is seen following a syncopal episode for which he underwent  catheterization and was found to have significant disease. He underwent  PCI of his RCA and his left anterior descending which was associated  with periprocedural troponin bump up to about 2.   He has done well since then. He is taking his Plavix, aspirin and  Vytorin 10/80. His last LDL was 55 and HDL was in the 30s.   IMPRESSION:  1. Syncope.  2. Coronary artery disease status post two-vessel percutaneous      coronary intervention complicated by periinfarct troponin bump.  3. Dyslipidemia on therapy with a persistently low HDL.   Will plan to check his LFTs today. He will be following with Dr. Debby Bud  in about 3 months and I will see him in about 6.   At some point will need to make a decision about long-term Plavix.     Duke Salvia, MD, [facility]  Electronically Signed    SCK/MedQ  DD: 02/13/2006  DT: 02/14/2006  Job #: [id number]
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
## 2010-07-15 ENCOUNTER — Other Ambulatory Visit: Payer: Self-pay | Admitting: Internal Medicine

## 2010-07-15 ENCOUNTER — Other Ambulatory Visit (INDEPENDENT_AMBULATORY_CARE_PROVIDER_SITE_OTHER): Payer: 59

## 2010-07-15 DIAGNOSIS — E785 Hyperlipidemia, unspecified: Secondary | ICD-10-CM

## 2010-07-15 LAB — LIPID PANEL
HDL: 34 mg/dL — ABNORMAL LOW (ref >39.00)
LDL Cholesterol: 77 mg/dL (ref 0–99)
Total CHOL/HDL Ratio: 4

## 2010-07-19 ENCOUNTER — Encounter: Payer: Self-pay | Admitting: Internal Medicine

## 2010-08-13 ENCOUNTER — Telehealth: Payer: Self-pay | Admitting: Internal Medicine

## 2010-08-13 NOTE — Telephone Encounter (Signed)
Pt needs refill for Crestor 20mg . Pt said Dr. Graciela Husbands had increased pt RX from 10mg  Crestor to 20 mg but told pt to take two of the 10mg  crestor to make up 20 mg until pt ran out of RX. Pt is now out of 10 mg of Crestor pt would like RX increased from 10mg  to 20 mg of Crestor.   Please call RX into [facility]: [phone]  Please return call to pt wife to ensure this message was received.

## 2010-08-17 ENCOUNTER — Telehealth: Payer: Self-pay | Admitting: Internal Medicine

## 2010-08-17 MED ORDER — ROSUVASTATIN CALCIUM 20 MG PO TABS: 20.0000 mg | ORAL_TABLET | Freq: Every day | ORAL | Status: DC

## 2010-08-17 NOTE — Telephone Encounter (Signed)
Pt needs refill on crestor 20mg  qhs and they need a call to know when it is done so they can place the order pt almost out

## 2010-08-18 ENCOUNTER — Other Ambulatory Visit: Payer: Self-pay | Admitting: *Deleted

## 2010-08-18 MED ORDER — ROSUVASTATIN CALCIUM 20 MG PO TABS: 20.0000 mg | ORAL_TABLET | Freq: Every day | ORAL | Status: DC

## 2010-08-19 NOTE — Telephone Encounter (Signed)
Pt still has not heard from our office as to the Rx for crestor 20mg  [facility]hs [facility] and pt will be out by Saturday so they need a paper Rx written so they can pick it up and fill at a local Pharm if needed so they can make sure pt gets his meds and dosen't run out please call today so this can be handled today. You can reach his spouse at [phone] or [phone] and please try both numbers and if need be you can leave a message

## 2010-08-19 NOTE — Telephone Encounter (Signed)
RN s/w Pt's wife: Jeremy [name]. Pt has been taking current Rx of 2 tablets of 10 mg Crestor for a total of 20 Mg Daily. Current Rx should arrive tomorrow 08/20/10.  However, Pt would prefer to take 1 tablet of 20 Mg Crestor Daily and request a paper Rx. Paper Rx will be waiting for Dr Odessa Fleming signature upon his return to the office tomorrow. Jeremy [name] asked if it would be ready by Friday afternoon. RN suggested to check after 2:00pm. Jeremy [name] understanding.

## 2010-08-20 ENCOUNTER — Other Ambulatory Visit: Payer: Self-pay | Admitting: *Deleted

## 2010-08-20 DIAGNOSIS — E7800 Pure hypercholesterolemia, unspecified: Secondary | ICD-10-CM

## 2010-08-20 MED ORDER — ROSUVASTATIN CALCIUM 20 MG PO TABS: 20.0000 mg | ORAL_TABLET | Freq: Every day | ORAL | Status: DC

## 2010-08-20 NOTE — Telephone Encounter (Signed)
Done. Pt picked up RX.

## 2010-09-23 ENCOUNTER — Ambulatory Visit (INDEPENDENT_AMBULATORY_CARE_PROVIDER_SITE_OTHER): Payer: 59 | Admitting: Internal Medicine

## 2010-09-23 ENCOUNTER — Encounter: Payer: Self-pay | Admitting: Internal Medicine

## 2010-09-23 DIAGNOSIS — E785 Hyperlipidemia, unspecified: Secondary | ICD-10-CM

## 2010-09-23 DIAGNOSIS — I2510 Atherosclerotic heart disease of native coronary artery without angina pectoris: Secondary | ICD-10-CM

## 2010-09-23 DIAGNOSIS — R55 Syncope and collapse: Secondary | ICD-10-CM

## 2010-09-23 NOTE — Assessment & Plan Note (Signed)
His LDL is nearly at goal. We will leave him on his current medications. The leg discomfort may or may not represent a Crestor reaction. It seems to be relieved by stretching. We'll follow this.

## 2010-09-23 NOTE — Patient Instructions (Signed)
Your physician has recommended you make the following change in your medication:  1) Stop Plavix  Your physician wants you to follow-up in: 8 months with Dr. Graciela Husbands. You will receive a reminder letter in the mail two months in advance. If you don't receive a letter, please call our office to schedule the follow-up appointment.

## 2010-09-23 NOTE — Progress Notes (Signed)
  HPI: Jeremy [name] is a 60 y.o. male Seen in followup for syncope which is now quiescient . He was also found to have coronary artery disease and is status post Taxus stenting for his right coronary artery and non-described stenting of his LAD. This was in 2007. Left ventricular function was normal  He has a dyslipidemia with issues of low HDL. We switched him to Crestor and his HDL is on the low normal range at 40 measured in February. However, with the discontinuation of his Vytorin, his LDL is running at 100. At his last visit we increased his Crestor. His LDL is now in the high 70s. His HDL is in the low 30s.  He has some leg discomfort in his right leg which is relieved by stretching.  The patient denies chest pain, shortness of breath, nocturnal dyspnea, orthopnea or peripheral edema.  There have been no palpitations, lightheadedness or syncope.   He is exercising 3 mornings a week. He has commitments the other mornings.    Current Outpatient Prescriptions  Medication Sig Dispense Refill  . aspirin 81 MG tablet Take 81 mg by mouth daily.        . clopidogrel (PLAVIX) 75 MG tablet Take 75 mg by mouth daily.        . fluticasone (FLONASE) 50 MCG/ACT nasal spray 2 sprays by Nasal route as directed.        . rosuvastatin (CRESTOR) 20 MG tablet Take 1 tablet (20 mg total) by mouth at bedtime.  90 tablet  3    No Known Allergies  Past Medical History  Diagnosis Date  . Old myocardial infarction   . Syncope   . Postsurgical percutaneous transluminal coronary angioplasty status   . Ill-defined closed fractures of upper limb   . Coronary atherosclerosis of unspecified type of vessel, native or graft     Past Surgical History  Procedure Date  . Appendectomy   . Coronary angioplasty     Family History  Problem Relation Age of Onset  . Prostate cancer Father   . Heart attack Paternal Grandmother     History   Social History  . Marital Status: Married    Spouse Name: N/A      Number of Children: N/A  . Years of Education: N/A   Occupational History  . Not on file.   Social History Main Topics  . Smoking status: Former Games developer  . Smokeless tobacco: Not on file  . Alcohol Use: Yes     occasional  . Drug Use: No  . Sexually Active: Not on file   Other Topics Concern  . Not on file   Social History Narrative  . No narrative on file    Fourteen point review of systems was negative except as noted in HPI and PMH   PHYSICAL EXAMINATION  Blood pressure 114/68, pulse 60, height 5\' 10"  (1.778 m), weight 197 lb (89.359 kg).   Well developed and nourished older  male in no acute distress HENT normal Neck supple with JVP-flat Carotids brisk and full without bruits Back without scoliosis or kyphosis Clear Regular rate and rhythm, no murmurs or gallops Abd-soft with active BS without hepatomegaly or midline pulsation Femoral pulses 2+ distal pulses intact No Clubbing cyanosis edema Skin-warm and dry LN-neg submandibular and supraclavicular A & Oriented CN 3-12 normal  Grossly normal sensory and motor function Affect engaging .

## 2010-09-23 NOTE — Assessment & Plan Note (Signed)
Patient has been stable. Note 5 years we will discontinue his Plavix

## 2010-09-23 NOTE — Assessment & Plan Note (Signed)
No recurrences.

## 2010-09-27 ENCOUNTER — Other Ambulatory Visit: Payer: Self-pay | Admitting: *Deleted

## 2010-09-27 DIAGNOSIS — E7800 Pure hypercholesterolemia, unspecified: Secondary | ICD-10-CM

## 2010-09-27 MED ORDER — ROSUVASTATIN CALCIUM 20 MG PO TABS: 20.0000 mg | ORAL_TABLET | Freq: Every day | ORAL | Status: DC

## 2010-09-27 NOTE — Telephone Encounter (Signed)
[facility] today for crestor. Danielle Rankin

## 2010-10-02 ENCOUNTER — Encounter: Payer: Self-pay | Admitting: Internal Medicine

## 2010-10-03 ENCOUNTER — Encounter: Payer: Self-pay | Admitting: Physician Assistant

## 2010-10-03 ENCOUNTER — Encounter: Payer: Self-pay | Admitting: Internal Medicine

## 2010-10-05 ENCOUNTER — Telehealth: Payer: Self-pay | Admitting: Internal Medicine

## 2010-10-05 NOTE — Telephone Encounter (Signed)
Patient states had a syncope episode and a  drop in heart rate to 30 beats/ minute last week. He was in the hospital and monitored for two days in a [place] hospital,  an echo and blood work was done . On D/C it was recommended for pt. to see Dr. Graciela Husbands as soon as possible.

## 2010-10-05 NOTE — Telephone Encounter (Signed)
Patient aware that Dr. Graciela Husbands will see him in his clinic on 10/21/10 at 4:45 PM.

## 2010-10-05 NOTE — Telephone Encounter (Signed)
I spoke with the patient. He is wanting to speak with Dr. Graciela Husbands today. I made him aware that Dr. Graciela Husbands is in clinic all day, but I will relay this message to him.

## 2010-10-05 NOTE — Telephone Encounter (Signed)
Patient returning call back to nurse.

## 2010-10-05 NOTE — Telephone Encounter (Signed)
  Was in [place]  Had Syncope with prodrome of 10-15 minutes  HR inintially normal and then about 20 min later  ECG >>30  By EMS  Was hospitalized.  Troponin wan Normal but CK elevated.  Recommended stress test on arrival home   Similar to initial episode some years ago    Have recommended PA workup and then outpt cath given known prev CAD and I need to exclude with high-sensitivity coronary artery disease

## 2010-10-05 NOTE — Telephone Encounter (Signed)
I spoke with the patient. He will come in tomorrow at 9:45am for an appointment with Tereso Newcomer, PA.

## 2010-10-05 NOTE — Telephone Encounter (Signed)
Pt was in [place] hospital in [place] for syncope and bradicardia and wants to talk to someone to see how soon he could be seen

## 2010-10-06 ENCOUNTER — Encounter: Payer: Self-pay | Admitting: Physician Assistant

## 2010-10-06 ENCOUNTER — Ambulatory Visit (INDEPENDENT_AMBULATORY_CARE_PROVIDER_SITE_OTHER): Payer: 59 | Admitting: Physician Assistant

## 2010-10-06 ENCOUNTER — Encounter: Payer: Self-pay | Admitting: *Deleted

## 2010-10-06 DIAGNOSIS — R001 Bradycardia, unspecified: Secondary | ICD-10-CM

## 2010-10-06 DIAGNOSIS — E785 Hyperlipidemia, unspecified: Secondary | ICD-10-CM

## 2010-10-06 DIAGNOSIS — I498 Other specified cardiac arrhythmias: Secondary | ICD-10-CM

## 2010-10-06 DIAGNOSIS — I2510 Atherosclerotic heart disease of native coronary artery without angina pectoris: Secondary | ICD-10-CM

## 2010-10-06 DIAGNOSIS — R55 Syncope and collapse: Secondary | ICD-10-CM

## 2010-10-06 LAB — CBC WITH DIFFERENTIAL/PLATELET
Basophils Relative: 0.2 % (ref 0.0–3.0)
Eosinophils Relative: 1.9 % (ref 0.0–5.0)
Hemoglobin: 14.4 g/dL (ref 13.0–17.0)
Lymphocytes Relative: 20 % (ref 12.0–46.0)
MCV: 90.3 fl (ref 78.0–100.0)
Neutro Abs: 4.6 10*3/uL (ref 1.4–7.7)
Neutrophils Relative %: 68.3 % (ref 43.0–77.0)
RBC: 4.7 Mil/uL (ref 4.22–5.81)
WBC: 6.7 10*3/uL (ref 4.5–10.5)

## 2010-10-06 LAB — PROTIME-INR: INR: 1.1 ratio — ABNORMAL HIGH (ref 0.8–1.0)

## 2010-10-06 LAB — BASIC METABOLIC PANEL
CO2: 30 mEq/L (ref 19–32)
Calcium: 9 mg/dL (ref 8.4–10.5)
Creatinine, Ser: 0.9 mg/dL (ref 0.4–1.5)
Potassium: 4.1 mEq/L (ref 3.5–5.1)

## 2010-10-06 MED ORDER — NITROGLYCERIN 0.4 MG SL SUBL: 0.4000 mg | SUBLINGUAL_TABLET | SUBLINGUAL | Status: DC | PRN

## 2010-10-06 MED ORDER — CLOPIDOGREL BISULFATE 75 MG PO TABS: 75.0000 mg | ORAL_TABLET | Freq: Every day | ORAL | Status: DC

## 2010-10-06 NOTE — Assessment & Plan Note (Signed)
If cath unrevealing, he may need an event monitor.

## 2010-10-06 NOTE — Assessment & Plan Note (Signed)
Proceed to cath as noted.  Plavix and NTG refilled today.

## 2010-10-06 NOTE — Patient Instructions (Addendum)
Your physician has requested that you have a cardiac catheterization 10/08/10 @ 7:30 AM WITH DR. Shirlee Latch YOU WILL NEED TO BE THERE AT 6:30 AM. Cardiac catheterization is used to diagnose and/or treat various heart conditions. Doctors may recommend this procedure for a number of different reasons. The most common reason is to evaluate chest pain. Chest pain can be a symptom of coronary artery disease (CAD), and cardiac catheterization can show whether plaque is narrowing or blocking your heart's arteries. This procedure is also used to evaluate the valves, as well as measure the blood flow and oxygen levels in different parts of your heart. For further information please visit [email or web address] Please follow instruction sheet, as given.   REFILLS HAVE BEEN SENT IN TODAY FOR PLAVIX AND NITROGLYCERIN TO [facility] TODAY FOR YOU

## 2010-10-06 NOTE — Assessment & Plan Note (Signed)
Continue Crestor 

## 2010-10-06 NOTE — Assessment & Plan Note (Signed)
Symptoms similar to prior presenting episode that resulted in two-vessel PCI.  He is currently stable without recurrent symptoms and his ECG is normal.  He will continue on Plavix for now.  We will arrange outpatient cardiac catheterization in the next couple days.  He knows to go to the emergency room should he have any recurrent symptoms.  Risks and benefits of cardiac catheterization have been discussed with the patient.  These include bleeding, infection, kidney damage, stroke, heart attack, death.  The patient understands these risks and is willing to proceed.

## 2010-10-06 NOTE — Progress Notes (Signed)
History of Present Illness: Primary Electrophysiologist:  Dr. Sherryl Manges   Jeremy [name] is a 60 y.o. male presents for pre-cath workup.  He has a history of CAD, hyperlipidemia.  Cardiac catheterization 8/07: Proximal LAD 50-75%, mid LAD 60%, ostial septal perforator 80%, proximal RCA 50% and then 80-85%, distal RCA 80-90%, EF 55-60%.  PCI was performed at that time colon distal RCA treated with a Taxus drug-eluting stent.  The proximal RCA treated with a Taxus drug eluting stent.  The LAD was studied with IVUS and treated with a Taxus drug eluding stent.  Echocardiogram 8/07: EF 65%.  The patient spoke with Dr. Graciela Husbands over the telephone yesterday.  He was in [place] recently and had a syncopal episode.  This was similar to his previous stenting symptoms with angina.  Dr. Graciela Husbands recommended that he come in today to be worked up for cardiac catheterization.  I have reviewed his records from [facility] in [place].  He was discharged in 9/2.  He was admitted 2 days prior.  He had a syncopal episode.  He was talking to some friends about 2-3 hours after eating.  He did feel some minimal symptoms of near syncope prior to the syncopal episode.  There was no head injury.  When EMS arrived, the records indicate his heart rate was in the 30s.  He did receive atropine.  He was also noted to be orthostatic and received IV fluids.  His cardiac enzymes were unremarkable except for mildly elevated CPK.  There was an echocardiogram done.  The report is not available but the discharge summary indicates he had a hyperdynamic LV with normal LVF and no WMA.  Labs: Hemoglobin 13.7, LDL 79, potassium 4.3, creatinine 0.9.  Carotid Dopplers were also performed and this demonstrated no ICA stenosis.  Since returning home, he feels well.  The patient denies chest pain, shortness of breath, syncope, orthopnea, PND or significant pedal edema.  He denies near syncope or weakness.   Of note, his symptoms are similar to prior  anginal symptoms.  Past Medical History  Diagnosis Date  . Coronary artery disease     Cardiac catheterization 8/07: Proximal LAD 50-75%, mid LAD 60%, ostial septal perforator 80%, proximal RCA 50% and then 80-85%, distal RCA 80-90%, EF 55-60%.  PCI was performed at that time colon distal RCA treated with a Taxus drug-eluting stent.  The proximal RCA treated with a Taxus drug eluting stent.  The LAD was studied with IVUS and treated with a Taxus drug eluding stent.    . Syncope   . Hyperlipidemia   . Ill-defined closed fractures of upper limb   . Sinus bradycardia     Current Outpatient Prescriptions  Medication Sig Dispense Refill  . aspirin 81 MG tablet Take 81 mg by mouth daily.        . clopidogrel (PLAVIX) 75 MG tablet Take 1 tablet (75 mg total) by mouth daily.  90 tablet  3  . fluticasone (FLONASE) 50 MCG/ACT nasal spray 2 sprays by Nasal route as directed.        . rosuvastatin (CRESTOR) 20 MG tablet Take 1 tablet (20 mg total) by mouth at bedtime.  90 tablet  3  . DISCONTD: clopidogrel (PLAVIX) 75 MG tablet Take 75 mg by mouth daily.        Marland Kitchen DISCONTD: clopidogrel (PLAVIX) 75 MG tablet Take 1 tablet (75 mg total) by mouth daily.  30 tablet  1  . DISCONTD: clopidogrel (PLAVIX) 75  MG tablet Take 1 tablet (75 mg total) by mouth daily.  90 tablet  3  . nitroGLYCERIN (NITROSTAT) 0.4 MG SL tablet Place 1 tablet (0.4 mg total) under the tongue every 5 (five) minutes as needed for chest pain.  25 tablet  1    Allergies: No Known Allergies  Social history:  Nonsmoker  Family history:  Positive for CAD  ROS:  Please see the history of present illness.  All other systems reviewed and negative.   Vital Signs: BP 130/70  Pulse 61  Ht 5\' 11"  (1.803 m)  Wt 192 lb (87.091 kg)  BMI 26.78 kg/m2  PHYSICAL EXAM: Well nourished, well developed, in no acute distress HEENT: normal Neck: no JVD Vascular: No carotid bruits; no femoral artery bruits bilaterally Cardiac:  normal S1, S2;  RRR; no murmur Lungs:  clear to auscultation bilaterally, no wheezing, rhonchi or rales Abd: soft, nontender, no hepatomegaly Ext: no edema Skin: warm and dry Neuro:  CNs 2-12 intact, no focal abnormalities noted Psych: Normal affect  EKG:  Normal sinus rhythm, heart rate 61, normal axis, no ischemic changes  ASSESSMENT AND PLAN:

## 2010-10-08 ENCOUNTER — Inpatient Hospital Stay (HOSPITAL_BASED_OUTPATIENT_CLINIC_OR_DEPARTMENT_OTHER)
Admission: RE | Admit: 2010-10-08 | Discharge: 2010-10-08 | Disposition: A | Discharge status: Home / Self Care | Payer: 59 | Source: Ambulatory Visit | Attending: Cardiology | Admitting: Cardiology

## 2010-10-08 ENCOUNTER — Telehealth: Payer: Self-pay | Admitting: Physician Assistant

## 2010-10-08 ENCOUNTER — Other Ambulatory Visit: Payer: Self-pay | Admitting: Physician Assistant

## 2010-10-08 DIAGNOSIS — R55 Syncope and collapse: Secondary | ICD-10-CM

## 2010-10-08 DIAGNOSIS — I2510 Atherosclerotic heart disease of native coronary artery without angina pectoris: Secondary | ICD-10-CM | POA: Insufficient documentation

## 2010-10-08 DIAGNOSIS — Z9861 Coronary angioplasty status: Secondary | ICD-10-CM | POA: Insufficient documentation

## 2010-10-08 NOTE — Telephone Encounter (Signed)
Hi Ladies, not sure who handles the A's, but would one of you mind setting pt up for an event monitor with Windell Moulding. Thank you for your help.Danielle Rankin

## 2010-10-08 NOTE — Telephone Encounter (Signed)
Please arrange event monitor for near syncope. Thank you Tereso Newcomer, PA-C

## 2010-10-11 ENCOUNTER — Encounter (INDEPENDENT_AMBULATORY_CARE_PROVIDER_SITE_OTHER): Payer: 59

## 2010-10-11 ENCOUNTER — Encounter: Payer: Self-pay | Admitting: *Deleted

## 2010-10-11 DIAGNOSIS — I498 Other specified cardiac arrhythmias: Secondary | ICD-10-CM

## 2010-10-11 DIAGNOSIS — R55 Syncope and collapse: Secondary | ICD-10-CM

## 2010-10-16 NOTE — Cardiovascular Report (Signed)
  [name], [name]  MEDICAL RECORD NO.:  [id number]  LOCATION:                                 FACILITY:  PHYSICIAN:  Marca Ancona, MD      DATE OF BIRTH:  Oct 03, 1950  DATE OF PROCEDURE:  10/08/2010 DATE OF DISCHARGE:                           CARDIAC CATHETERIZATION   PROCEDURE: 1. Left heart catheterization. 2. Coronary angiography. 3. Left ventriculography.  INDICATIONS:  This is a 60 year old with a history of known coronary artery disease who had an episode of unexplained syncope.  He was set up for a left heart catheterization given the known disease and unexplained syncope.  PROCEDURE NOTE:  After informed consent was obtained, the right groin was sterilely prepped and draped.  Lidocaine 1% was used to locally anesthetize the right coronary artery.  The right common femoral artery was entered using modified Seldinger technique and a 4-French arterial sheath was placed.  The left coronary artery was engaged using a JL-4 catheter.  The right coronary artery was engaged using a 3-DRC catheter. The left ventricle was entered using angled pigtail catheter.  There were no complications.  FINDINGS: 1. Hemodynamics:  LV 116/15, aorta 106/59. 2. Left Ventriculography:  EF was estimated to be 55% with no regional     wall motion abnormalities. 3. Right coronary artery:  The right coronary artery had a proximal     stent.  There was about 25-30% in-stent restenosis in the proximal     portion of the stent.  The remainder of the RCA had luminal     irregularities.  The RCA was a dominant vessel. 4. Left main:  The left main had about 25% narrowing in the     midportion. 5. Left circumflex system:  There was about 30% proximal circumflex     stenosis.  There were 4 small obtuse marginals and a large PLOM. 6. LAD system:  There was a stent in the proximal LAD with about 25%     in-stent restenosis.  In the distal portion of the stent  and just     after the stent, there was 40-50% stenosis with calcified plaque.     The remainder of the LAD had luminal irregularities.  IMPRESSION:  The patient has nonobstructive coronary artery disease with patent stents.  I do not think his syncope could be explained by anything on his heart catheterization.  He will continue to follow with Dr. Graciela Husbands for evaluation by heart monitoring.     Marca Ancona, MD     DM/MEDQ  D:  10/08/2010  T:  10/08/2010  Job:  [id number]  cc:   Duke Salvia, MD, [facility]  Electronically Signed by Marca Ancona MD on 10/16/2010 11:53:44 PM

## 2010-10-21 ENCOUNTER — Ambulatory Visit (INDEPENDENT_AMBULATORY_CARE_PROVIDER_SITE_OTHER): Payer: 59 | Admitting: Internal Medicine

## 2010-10-21 ENCOUNTER — Encounter: Payer: Self-pay | Admitting: Internal Medicine

## 2010-10-21 DIAGNOSIS — I2510 Atherosclerotic heart disease of native coronary artery without angina pectoris: Secondary | ICD-10-CM

## 2010-10-21 DIAGNOSIS — R55 Syncope and collapse: Secondary | ICD-10-CM

## 2010-10-21 NOTE — Progress Notes (Signed)
  HPI  Jeremy [name] is a 60 y.o. male Seen following an episode of syncope in [place].  This episode occurred in the context of a cold while standing in the lobby at a hotel. He had a 5 minute prodrome of queasiness and lightheadedness at which time he decided to go to his room. He didn't make it. He he passed out. His wife describes him as being pale cool and diaphoretic. EMS was called. Initial rhythms were normal. Subsequent recordings demonstrated an episode of bradycardia not clearly associated with further symptoms. He was admitted to a hospital in [place]. No specific cardiac diagnosis was made.  In hindsight the prodrome is similar to his initial episode of syncope that occurred in the car.  At that time, he was found to have coronary artery disease in his distal RCA was treated with an Taxus drug-eluting stent. He also had stenting of his proximal RCA and of his LAD. Because this was found unsuspectedly, he was admitted for repeat catheterization following a syncopal episode which demonstrated no significant progression of his coronary disease.     Past Medical History  Diagnosis Date  . Coronary artery disease     Cardiac catheterization 8/07: Proximal LAD 50-75%, mid LAD 60%, ostial septal perforator 80%, proximal RCA 50% and then 80-85%, distal RCA 80-90%, EF 55-60%.  PCI was performed at that time colon distal RCA treated with a Taxus drug-eluting stent.  The proximal RCA treated with a Taxus drug eluting stent.  The LAD was studied with IVUS and treated with a Taxus drug eluding stent.    . Syncope   . Hyperlipidemia   . Ill-defined closed fractures of upper limb   . Sinus bradycardia     Past Surgical History  Procedure Date  . Appendectomy   . Coronary angioplasty     Current Outpatient Prescriptions  Medication Sig Dispense Refill  . aspirin 81 MG tablet Take 81 mg by mouth daily.        . clopidogrel (PLAVIX) 75 MG tablet Take 1 tablet (75 mg total) by mouth daily.   90 tablet  3  . fluticasone (FLONASE) 50 MCG/ACT nasal spray 2 sprays by Nasal route as directed.        . nitroGLYCERIN (NITROSTAT) 0.4 MG SL tablet Place 1 tablet (0.4 mg total) under the tongue every 5 (five) minutes as needed for chest pain.  25 tablet  1  . rosuvastatin (CRESTOR) 20 MG tablet Take 1 tablet (20 mg total) by mouth at bedtime.  90 tablet  3    No Known Allergies  Review of Systems negative except from HPI and PMH  Physical Exam Well developed and well nourished in no acute distress HENT normal E scleral and icterus clear Neck Supple JVP flat; carotids brisk and full Clear to ausculation Regular rate and rhythm, no murmurs gallops or rub Soft with active bowel sounds No clubbing cyanosis and edema Alert and oriented, grossly normal motor and sensory function Skin Warm and Dry    Assessment and  Plan

## 2010-10-21 NOTE — Assessment & Plan Note (Signed)
Continue current medications. 

## 2010-10-21 NOTE — Assessment & Plan Note (Signed)
We spent about 45 minutes discussing the history as well as the physiology of neurally mediated syncope which is I think at the root of the problem. Inferior ischemia via a Bezold-Jarisch  reflex is a potential although unlikely trigger based on the catheterization.  We discussed the important role of isometric contractions, maintenance of hydration, and the awareness of the prodrome and the need for immediate recumbency. We'll plan at this point no further intervention apart from continuing to monitor temperature there is no evidence of a primary arrhythmia issue.

## 2010-10-28 ENCOUNTER — Encounter: Payer: Self-pay | Admitting: Physician Assistant

## 2010-10-29 ENCOUNTER — Telehealth: Payer: Self-pay | Admitting: Internal Medicine

## 2010-10-29 NOTE — Telephone Encounter (Addendum)
Records Received from [facility] [place], gave to [facility] 10/29/10/km

## 2010-11-23 NOTE — Progress Notes (Signed)
The patient wore his monitor from 9/10-10/9. He was seen on 9/20 with Lilian Coma. The only transmission was his baseline from 10/11/10. I did not notify the patient of this since he was seen on 9/20.

## 2011-03-11 ENCOUNTER — Other Ambulatory Visit: Payer: Self-pay | Admitting: *Deleted

## 2011-03-11 DIAGNOSIS — E7800 Pure hypercholesterolemia, unspecified: Secondary | ICD-10-CM

## 2011-03-11 MED ORDER — ROSUVASTATIN CALCIUM 20 MG PO TABS: 20.0000 mg | ORAL_TABLET | Freq: Every day | ORAL | Status: DC

## 2011-03-21 ENCOUNTER — Other Ambulatory Visit: Payer: Self-pay | Admitting: Internal Medicine

## 2011-03-21 DIAGNOSIS — E7800 Pure hypercholesterolemia, unspecified: Secondary | ICD-10-CM

## 2011-03-21 MED ORDER — ROSUVASTATIN CALCIUM 20 MG PO TABS: 20.0000 mg | ORAL_TABLET | Freq: Every day | ORAL | Status: DC

## 2011-03-21 NOTE — Telephone Encounter (Signed)
New Msg: Pt wife calling wanting to check on status of pt refill request for crestor. Pt wife said pharmacy has faxed request to our office twice with no response. Please call RX in asap.   Please return pt wife call to inform when RX was called in.

## 2011-03-21 NOTE — Telephone Encounter (Signed)
We [facility]

## 2011-03-22 ENCOUNTER — Other Ambulatory Visit: Payer: Self-pay | Admitting: Internal Medicine

## 2011-03-22 DIAGNOSIS — E7800 Pure hypercholesterolemia, unspecified: Secondary | ICD-10-CM

## 2011-03-29 ENCOUNTER — Telehealth: Payer: Self-pay | Admitting: Internal Medicine

## 2011-03-29 NOTE — Telephone Encounter (Signed)
Refill f/u  Patient wife called for status on Crestor update, notified her that med was orded on 03/21/11 and [facility]  She will watch the mail for order.

## 2011-03-30 ENCOUNTER — Other Ambulatory Visit: Payer: Self-pay | Admitting: *Deleted

## 2011-03-30 ENCOUNTER — Telehealth: Payer: Self-pay | Admitting: Internal Medicine

## 2011-03-30 DIAGNOSIS — E7800 Pure hypercholesterolemia, unspecified: Secondary | ICD-10-CM

## 2011-03-30 MED ORDER — CLOPIDOGREL BISULFATE 75 MG PO TABS: 75.0000 mg | ORAL_TABLET | Freq: Every day | ORAL | Status: DC

## 2011-03-30 MED ORDER — ROSUVASTATIN CALCIUM 20 MG PO TABS: 20.0000 mg | ORAL_TABLET | Freq: Every day | ORAL | Status: DC

## 2011-03-30 NOTE — Telephone Encounter (Signed)
Walk in pt Form " pt Needs Script " sent to [facility] 03/30/11/KM

## 2011-04-19 ENCOUNTER — Encounter: Payer: Self-pay | Admitting: Internal Medicine

## 2011-04-19 ENCOUNTER — Ambulatory Visit (INDEPENDENT_AMBULATORY_CARE_PROVIDER_SITE_OTHER): Payer: 59 | Admitting: Internal Medicine

## 2011-04-19 ENCOUNTER — Other Ambulatory Visit (INDEPENDENT_AMBULATORY_CARE_PROVIDER_SITE_OTHER): Payer: 59

## 2011-04-19 VITALS — BP 108/64 | HR 60 | Temp 97.3°F | Resp 14 | Ht 69.5 in | Wt 198.0 lb

## 2011-04-19 DIAGNOSIS — E7800 Pure hypercholesterolemia, unspecified: Secondary | ICD-10-CM

## 2011-04-19 DIAGNOSIS — I498 Other specified cardiac arrhythmias: Secondary | ICD-10-CM

## 2011-04-19 DIAGNOSIS — R001 Bradycardia, unspecified: Secondary | ICD-10-CM

## 2011-04-19 DIAGNOSIS — Z0000 Encounter for general adult medical examination without abnormal findings: Secondary | ICD-10-CM

## 2011-04-19 DIAGNOSIS — R55 Syncope and collapse: Secondary | ICD-10-CM

## 2011-04-19 DIAGNOSIS — E785 Hyperlipidemia, unspecified: Secondary | ICD-10-CM

## 2011-04-19 DIAGNOSIS — Z23 Encounter for immunization: Secondary | ICD-10-CM

## 2011-04-19 DIAGNOSIS — I2510 Atherosclerotic heart disease of native coronary artery without angina pectoris: Secondary | ICD-10-CM

## 2011-04-19 LAB — HEPATIC FUNCTION PANEL
AST: 29 U/L (ref 0–37)
Albumin: 4.4 g/dL (ref 3.5–5.2)
Alkaline Phosphatase: 83 U/L (ref 39–117)
Total Protein: 7.3 g/dL (ref 6.0–8.3)

## 2011-04-19 LAB — LIPID PANEL
Cholesterol: 149 mg/dL (ref 0–200)
HDL: 43.7 mg/dL (ref >39.00)
Total CHOL/HDL Ratio: 3
Triglycerides: 108 mg/dL (ref 0.0–149.0)

## 2011-04-19 MED ORDER — DESOXIMETASONE 0.25 % EX CREA: 1.0000 "application " | TOPICAL_CREAM | Freq: Two times a day (BID) | CUTANEOUS | Status: DC

## 2011-04-19 MED ORDER — ROSUVASTATIN CALCIUM 20 MG PO TABS: 20.0000 mg | ORAL_TABLET | Freq: Every day | ORAL | Status: DC

## 2011-04-19 MED ORDER — CLOPIDOGREL BISULFATE 75 MG PO TABS: 75.0000 mg | ORAL_TABLET | Freq: Every day | ORAL | Status: DC

## 2011-04-19 NOTE — Progress Notes (Signed)
Subjective:    Patient ID: Jeremy [name], male    DOB: Feb 17, 1950, 61 y.o.   MRN: [id number]  HPI Mr. Grenz presents for a routine medical exam. In the interval since his last visit he had an episode of syncope while in [place]. Reviewed Dr. Odessa Fleming thorough note of Septermber 20th and the discussion as to cause. No further work-up was felt indicated. Mr. Redner was advised to pay attention to his symptoms and to seek immediate recumbency when he has prodromal symptoms. He has otherwise done well. He has had no other medical events, no surgery and no injury.  He is 100% Independent in ADLs. He has smoke detectors, the house is fall safe but lacks grab bars in his bathroom. He always wears his seatbelt. There are no firearms in the home. No out of country travel. He does cardio for 35-45 minutes 3 times a week. He has a healthy diet. He is current with his dentist, ophthalmologist, has some high freqeuncy hearing loss.   Past Medical History  Diagnosis Date  . Coronary artery disease     Cardiac catheterization 8/07: Proximal LAD 50-75%, mid LAD 60%, ostial septal perforator 80%, proximal RCA 50% and then 80-85%, distal RCA 80-90%, EF 55-60%.  PCI was performed at that time colon distal RCA treated with a Taxus drug-eluting stent.  The proximal RCA treated with a Taxus drug eluting stent.  The LAD was studied with IVUS and treated with a Taxus drug eluding stent.    . Syncope   . Hyperlipidemia   . Ill-defined closed fractures of upper limb   . Sinus bradycardia    Past Surgical History  Procedure Date  . Appendectomy   . Coronary angioplasty    Family History  Problem Relation Age of Onset  . Prostate cancer Father   . Cancer Father     prostate cancer w/ mets  . Heart disease Father     CAD  . Hyperlipidemia Father   . Heart attack Paternal Grandmother   . Stroke Mother   . Cancer Maternal Uncle     colon cancer  . COPD Neg Hx   . Diabetes Neg Hx    History   Social History  .  Marital Status: Married    Spouse Name: N/A    Number of Children: 0  . Years of Education: 16   Occupational History  . financial planner    Social History Main Topics  . Smoking status: Former Games developer  . Smokeless tobacco: Never Used  . Alcohol Use: Yes     occasional  . Drug Use: No  . Sexually Active: Yes -- Male partner(s)   Other Topics Concern  . Not on file   Social History Narrative   HSG, [employer] - [employer] [place]. Married ''89. No children. Occupation: Forensic scientist         Review of Systems Constitutional:  Negative for fever, chills, activity change and unexpected weight change.  HEENT:  Negative for hearing loss, ear pain, congestion, neck stiffness and postnasal drip. Negative for sore throat or swallowing problems. Negative for dental complaints.   Eyes: Negative for vision loss or change in visual acuity.  Respiratory: Negative for chest tightness and wheezing. Negative for DOE.   Cardiovascular: Negative for chest pain or palpitations. No decreased exercise tolerance Gastrointestinal: No change in bowel habit. No bloating or gas. No reflux or indigestion Genitourinary: Negative for urgency, frequency, flank pain and difficulty urinating.  Musculoskeletal: Negative for  myalgias, back pain, arthralgias and gait problem.  Neurological: Negative for dizziness, tremors, weakness and headaches.  Hematological: Negative for adenopathy.  Psychiatric/Behavioral: Negative for behavioral problems and dysphoric mood.       Objective:   Physical Exam Filed Vitals:   04/19/11 1333  BP: 108/64  Pulse: 60  Temp: 97.3 F (36.3 C)  Resp: 14   Wt Readings from Last 3 Encounters:  04/19/11 198 lb (89.812 kg)  10/21/10 198 lb (89.812 kg)  10/06/10 192 lb (87.091 kg)    Gen'l: Well nourished well developed white male in no acute distress  HEENT: Head: Normocephalic and atraumatic. Right Ear: External ear normal. EAC/TM nl. Left Ear: External ear  normal.  EAC/TM nl. Nose: Nose normal. Mouth/Throat: Oropharynx is clear and moist. Dentition - native, in good repair. No buccal or palatal lesions. Posterior pharynx clear. Eyes: Conjunctivae and sclera clear. EOM intact. 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 Right eye exhibits no discharge. Left eye exhibits no discharge. Neck: Normal range of motion. Neck supple. No JVD present. No tracheal deviation present. No thyromegaly present.  Cardiovascular: Normal rate, regular rhythm, no gallop, no friction rub, no murmur heard.      Quiet precordium. 2+ radial and DP pulses . No carotid bruits Pulmonary/Chest: Effort normal. No respiratory distress or increased WOB, no wheezes, no rales. No chest wall deformity or CVAT. Abdominal: Soft. Bowel sounds are normal in all quadrants. He exhibits no distension, no tenderness, no rebound or guarding, No heptosplenomegaly  Genitourinary:  deferred Musculoskeletal: Normal range of motion. He exhibits no edema and no tenderness.       Small and large joints without redness, synovial thickening or deformity. Full range of motion preserved about all small, median and large joints.  Lymphadenopathy:    He has no cervical or supraclavicular adenopathy.  Neurological: He is alert and oriented to person, place, and time. CN II-XII intact. DTRs 2+ and symmetrical biceps, radial and patellar tendons. Cerebellar function normal with no tremor, rigidity, normal gait and station.  Skin: Skin is warm and dry. Red,raised rash noted medial aspect right thigh. No erythema.  Psychiatric: He has a normal mood and affect. His behavior is normal. Thought content normal.   Lab Results  Component Value Date   WBC 6.7 10/06/2010   HGB 14.4 10/06/2010   HCT 42.5 10/06/2010   PLT 241.0 10/06/2010   GLUCOSE 84 10/06/2010   CHOL 149 04/19/2011   TRIG 108.0 04/19/2011   HDL 43.70 04/19/2011   LDLDIRECT 107.3 07/10/2008   LDLCALC 84 04/19/2011   ALT 28 04/19/2011   AST 29  04/19/2011   NA 139 10/06/2010   K 4.1 10/06/2010   CL 100 10/06/2010   CREATININE 0.9 10/06/2010   BUN 21 10/06/2010   CO2 30 10/06/2010   TSH 1.54 03/18/2010   PSA 0.28 03/18/2010   INR 1.1* 10/06/2010         Assessment & Plan:

## 2011-04-19 NOTE — Assessment & Plan Note (Signed)
Very stable with no symptoms of chest pain or decreased exercise tolerance. Had eval at [facility] in [place], [place] in Sept '12

## 2011-04-19 NOTE — Assessment & Plan Note (Signed)
Patient knows to remain well hydrated and when he has prodromal symptoms to assume recumbency immediately.

## 2011-04-19 NOTE — Assessment & Plan Note (Addendum)
Stable on crestor.   Plan - routine lab with recommendations to follow.  Addendum - good control with LDL very close to goal of 80 or less.  Plan - continue present medications

## 2011-04-19 NOTE — Assessment & Plan Note (Signed)
Interval history notable for syncopal episode in September '13. Physical exam is normal. He is current with colorectal cancer screening. Chart reviewed: PSA's from '06 through '12 normal - no need for screening at this time.   IN summary - a very nice man who is medically stable. He will return as needed or in 1 year.

## 2011-04-19 NOTE — Assessment & Plan Note (Signed)
Has had recent eval, September '12, by Dr. Graciela Husbands following syncopal event. No further work-up.

## 2011-05-12 ENCOUNTER — Encounter: Payer: 59 | Admitting: Internal Medicine

## 2011-05-19 ENCOUNTER — Ambulatory Visit (INDEPENDENT_AMBULATORY_CARE_PROVIDER_SITE_OTHER): Payer: 59 | Admitting: Internal Medicine

## 2011-05-19 ENCOUNTER — Encounter: Payer: Self-pay | Admitting: Internal Medicine

## 2011-05-19 VITALS — BP 110/74 | HR 59 | Ht 69.5 in | Wt 196.8 lb

## 2011-05-19 DIAGNOSIS — E785 Hyperlipidemia, unspecified: Secondary | ICD-10-CM

## 2011-05-19 DIAGNOSIS — R55 Syncope and collapse: Secondary | ICD-10-CM

## 2011-05-19 DIAGNOSIS — I2510 Atherosclerotic heart disease of native coronary artery without angina pectoris: Secondary | ICD-10-CM

## 2011-05-19 NOTE — Assessment & Plan Note (Signed)
No recurrent syncope 

## 2011-05-19 NOTE — Assessment & Plan Note (Signed)
stabel  Encourage exercise

## 2011-05-19 NOTE — Assessment & Plan Note (Signed)
Patient has treated hyperlipidemia. His LDL is 84 at last check. Recent review of studies on intra individual variability suggests that he is close to arrange with his last value having been 77. Furthermore, he is onhigh dose Crestor and we don't have any data about the impact of adjunctive ezetamide so will continue

## 2011-05-19 NOTE — Patient Instructions (Signed)
Your physician wants you to follow-up in: 1 year with Dr. Klein. You will receive a reminder letter in the mail two months in advance. If you don't receive a letter, please call our office to schedule the follow-up appointment.  Your physician recommends that you continue on your current medications as directed. Please refer to the Current Medication list given to you today.  

## 2011-05-19 NOTE — Progress Notes (Signed)
  HPI  Jeremy [name] is a 61 y.o. male Seen in followup for syncope. He has had 2 episodes one remotely and one more recently in [place].  He also is a history of coronary artery disease in his distal RCA was treated with an Taxus drug-eluting stent at the site after his first syncopal episode.Marland Kitchen He also had stenting of his proximal RCA and of his LAD. Because this was found unsuspectedly, he was admitted for repeat catheterization following a syncopal episode which demonstrated no significant progression of his coronary disease.  The patient denies chest pain, shortness of breath, nocturnal dyspnea, orthopnea or peripheral edema.  There have been no palpitations, lightheadedness or syncope.       Past Medical History  Diagnosis Date  . Coronary artery disease     Cardiac catheterization 8/07: Proximal LAD 50-75%, mid LAD 60%, ostial septal perforator 80%, proximal RCA 50% and then 80-85%, distal RCA 80-90%, EF 55-60%.  PCI was performed at that time colon distal RCA treated with a Taxus drug-eluting stent.  The proximal RCA treated with a Taxus drug eluting stent.  The LAD was studied with IVUS and treated with a Taxus drug eluding stent.    . Syncope   . Hyperlipidemia   . Ill-defined closed fractures of upper limb   . Sinus bradycardia     Past Surgical History  Procedure Date  . Appendectomy   . Coronary angioplasty     Current Outpatient Prescriptions  Medication Sig Dispense Refill  . aspirin 81 MG tablet Take 81 mg by mouth daily.        . clopidogrel (PLAVIX) 75 MG tablet Take 1 tablet (75 mg total) by mouth daily.  90 tablet  3  . desoximetasone (TOPICORT) 0.25 % cream Apply 1 application topically 2 (two) times daily.  30 g  1  . fluticasone (FLONASE) 50 MCG/ACT nasal spray 2 sprays by Nasal route as directed.        . nitroGLYCERIN (NITROSTAT) 0.4 MG SL tablet Place 1 tablet (0.4 mg total) under the tongue every 5 (five) minutes as needed for chest pain.  25 tablet  1    . rosuvastatin (CRESTOR) 20 MG tablet Take 1 tablet (20 mg total) by mouth at bedtime.  90 tablet  3    No Known Allergies  Review of Systems negative except from HPI and PMH  Physical Exam Well developed and well nourished in no acute distress HENT normal E scleral and icterus clear Neck Supple JVP flat; carotids brisk and full Clear to ausculation Regular rate and rhythm, no murmurs gallops or rub Soft with active bowel sounds No clubbing cyanosis and edema Alert and oriented, grossly normal motor and sensory function Skin Warm and Dry  ECG  NSR  59 15/10/40 IRBBb  Assessment and  Plan

## 2011-05-23 DIAGNOSIS — Z0279 Encounter for issue of other medical certificate: Secondary | ICD-10-CM

## 2012-02-20 ENCOUNTER — Ambulatory Visit (INDEPENDENT_AMBULATORY_CARE_PROVIDER_SITE_OTHER): Payer: 59

## 2012-02-20 DIAGNOSIS — Z23 Encounter for immunization: Secondary | ICD-10-CM

## 2012-04-23 ENCOUNTER — Other Ambulatory Visit (INDEPENDENT_AMBULATORY_CARE_PROVIDER_SITE_OTHER): Payer: 59

## 2012-04-23 ENCOUNTER — Ambulatory Visit (INDEPENDENT_AMBULATORY_CARE_PROVIDER_SITE_OTHER): Payer: 59 | Admitting: Internal Medicine

## 2012-04-23 ENCOUNTER — Encounter: Payer: Self-pay | Admitting: Internal Medicine

## 2012-04-23 VITALS — HR 58 | Temp 97.9°F | Resp 16 | Ht 69.5 in | Wt 196.2 lb

## 2012-04-23 DIAGNOSIS — Z0000 Encounter for general adult medical examination without abnormal findings: Secondary | ICD-10-CM

## 2012-04-23 DIAGNOSIS — E7800 Pure hypercholesterolemia, unspecified: Secondary | ICD-10-CM

## 2012-04-23 DIAGNOSIS — E785 Hyperlipidemia, unspecified: Secondary | ICD-10-CM

## 2012-04-23 DIAGNOSIS — I2510 Atherosclerotic heart disease of native coronary artery without angina pectoris: Secondary | ICD-10-CM

## 2012-04-23 LAB — LIPID PANEL
Cholesterol: 144 mg/dL (ref 0–200)
HDL: 42.8 mg/dL (ref >39.00)
LDL Cholesterol: 79 mg/dL (ref 0–99)
Triglycerides: 110 mg/dL (ref 0.0–149.0)
VLDL: 22 mg/dL (ref 0.0–40.0)

## 2012-04-23 LAB — COMPREHENSIVE METABOLIC PANEL
ALT: 30 U/L (ref 0–53)
AST: 27 U/L (ref 0–37)
Albumin: 4.4 g/dL (ref 3.5–5.2)
Alkaline Phosphatase: 76 U/L (ref 39–117)
BUN: 16 mg/dL (ref 6–23)
Potassium: 4.1 mEq/L (ref 3.5–5.1)
Sodium: 137 mEq/L (ref 135–145)
Total Protein: 7.4 g/dL (ref 6.0–8.3)

## 2012-04-23 LAB — HEPATIC FUNCTION PANEL
ALT: 30 U/L (ref 0–53)
Albumin: 4.4 g/dL (ref 3.5–5.2)
Total Protein: 7.4 g/dL (ref 6.0–8.3)

## 2012-04-23 MED ORDER — ROSUVASTATIN CALCIUM 20 MG PO TABS: 20.0000 mg | ORAL_TABLET | Freq: Every day | ORAL | Status: DC

## 2012-04-23 MED ORDER — FLUTICASONE PROPIONATE 50 MCG/ACT NA SUSP: 2.0000 | NASAL | Status: AC

## 2012-04-23 MED ORDER — CLOPIDOGREL BISULFATE 75 MG PO TABS
75.0000 mg | ORAL_TABLET | Freq: Every day | ORAL | Status: DC
Start: 2012-04-23 — End: 2013-04-18

## 2012-04-23 NOTE — Assessment & Plan Note (Addendum)
Assessment: Pt is doing well on current regimen no muscle aches/pains, no CP, SOB, or syncope Plan: - lipid panel today - refill Crestor  Addendum: LDL cholesterol is at goal of 80 or less; HDL is at goal of 40+

## 2012-04-23 NOTE — Assessment & Plan Note (Signed)
Assessment: Doing well no recent CP, SOB, or syncope; vitals stable Plan: - refill Plavix - continue aspirin

## 2012-04-23 NOTE — Progress Notes (Signed)
Subjective:     Patient ID: Jeremy [name], male   DOB: August 23, 1950, 62 y.o.   MRN: [id number]  HPI Jeremy [name] is a 62 yo man with a hx of CAD s/p angioplasty in 2007 that presents today for an annual exam/medication refill.  He has no complaints today.  In the interval since his last visit he has had no major illness, no surgery, no injury. He remains very active, enjoys his work.   Past Medical History  Diagnosis Date  . Coronary artery disease     Cardiac catheterization 8/07: Proximal LAD 50-75%, mid LAD 60%, ostial septal perforator 80%, proximal RCA 50% and then 80-85%, distal RCA 80-90%, EF 55-60%.  PCI was performed at that time colon distal RCA treated with a Taxus drug-eluting stent.  The proximal RCA treated with a Taxus drug eluting stent.  The LAD was studied with IVUS and treated with a Taxus drug eluding stent.    . Syncope   . Hyperlipidemia   . Ill-defined closed fractures of upper limb   . Sinus bradycardia    Past Surgical History  Procedure Laterality Date  . Appendectomy    . Coronary angioplasty     Family History  Problem Relation Age of Onset  . Prostate cancer Father   . Cancer Father     prostate cancer w/ mets  . Heart disease Father     CAD  . Hyperlipidemia Father   . Heart attack Paternal Grandmother   . Stroke Mother   . Cancer Maternal Uncle     colon cancer  . COPD Neg Hx   . Diabetes Neg Hx    History   Social History  . Marital Status: Married    Spouse Name: N/A    Number of Children: 0  . Years of Education: 16   Occupational History  . financial planner    Social History Main Topics  . Smoking status: Former Games developer  . Smokeless tobacco: Never Used  . Alcohol Use: Yes     Comment: occasional  . Drug Use: No  . Sexually Active: Yes -- Male partner(s)   Other Topics Concern  . Not on file   Social History Narrative   HSG, [employer] - [employer] [place]. Married ''89. No children. Occupation: Forensic scientist    Current Outpatient Prescriptions on File Prior to Visit  Medication Sig Dispense Refill  . aspirin 81 MG tablet Take 81 mg by mouth daily.        Marland Kitchen desoximetasone (TOPICORT) 0.25 % cream Apply 1 application topically 2 (two) times daily.  30 g  1  . nitroGLYCERIN (NITROSTAT) 0.4 MG SL tablet Place 1 tablet (0.4 mg total) under the tongue every 5 (five) minutes as needed for chest pain.  25 tablet  1   No current facility-administered medications on file prior to visit.   Review of Systems  Constitutional: Negative for fever, chills and unexpected weight change.  HENT: Positive for hearing loss (pt reports hearing loss in his right ear).   Eyes: Negative for visual disturbance.       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   Cardiovascular: Negative for chest pain.  Gastrointestinal: Negative for nausea and diarrhea.  Genitourinary: Negative for difficulty urinating.       Negative for erectile dysfunction  Musculoskeletal:       Tight muscles in the backs of  his legs  Neurological: Negative for syncope and weakness.       Objective:   Physical Exam Filed Vitals:   04/23/12 0856  Pulse: 58  Temp: 97.9 F (36.6 C)  Resp: 16  BP: 144/80 SpO2: 97%  General: well developed, well nourished male in NAD HEENT: [place]/AT, EOMI, PERRLA, glasses in place, external ears normal bilaterally, throat is clean and non-erythematous CV: RRR, no m/r/g, no JVD, no lower extremity edema Pulm: CTA bilaterally, no wheezes, rales, or rhonci appreciated, lung fields tympanitic to percussion Abd: normal BS present in all four quadrants, soft, non-tender, non-distended, no HSM, tympanitic to percussion Extremities: 2+ radial and DP pulses GU: deferred Neuro: No apparent CN deficits (hearing is grossly normal bilaterally), muscle strength grossly 5/5, sensation grossly intact throughout, normal gait, DTRs intact Psych:  mood/affect appropriate for interaction  Lab Results  Component Value Date   WBC 6.7 10/06/2010   HGB 14.4 10/06/2010   HCT 42.5 10/06/2010   PLT 241.0 10/06/2010   GLUCOSE 85 04/23/2012   CHOL 144 04/23/2012   TRIG 110.0 04/23/2012   HDL 42.80 04/23/2012   LDLDIRECT 107.3 07/10/2008   LDLCALC 79 04/23/2012        ALT 30 04/23/2012   AST 27 04/23/2012        NA 137 04/23/2012   K 4.1 04/23/2012   CL 100 04/23/2012   CREATININE 1.0 04/23/2012   BUN 16 04/23/2012   CO2 29 04/23/2012   TSH 1.54 03/18/2010   PSA 0.28 03/18/2010   INR 1.1* 10/06/2010     Assessment:    Jeremy [name] was personally interviewed and examined. I agree with the assessment and plan per Jeremy [name], MSIII M.Norins, MD

## 2012-04-23 NOTE — Patient Instructions (Addendum)
Thanks for coming to see me.  Everything looks good on exam per Mr. Jeremy Roers, MS III and me  For routine lab today.   We will refill all Rx.  Please sign up for MyChart.

## 2012-04-24 DIAGNOSIS — Z0000 Encounter for general adult medical examination without abnormal findings: Secondary | ICD-10-CM | POA: Insufficient documentation

## 2012-04-24 NOTE — Assessment & Plan Note (Signed)
Interval history is negative for any problems. Physical exam is normal. Lab results are in normal range. He is current for colorectal cancer screening. Immunizations are up to date.  In summary - Jeremy [name] is a very nice man who appears to be medically stable. He will return in 1 year or as needed.

## 2012-05-16 ENCOUNTER — Ambulatory Visit (INDEPENDENT_AMBULATORY_CARE_PROVIDER_SITE_OTHER): Payer: 59 | Admitting: Internal Medicine

## 2012-05-16 ENCOUNTER — Encounter: Payer: Self-pay | Admitting: Internal Medicine

## 2012-05-16 VITALS — BP 108/65 | HR 67 | Ht 70.5 in | Wt 193.0 lb

## 2012-05-16 DIAGNOSIS — I2510 Atherosclerotic heart disease of native coronary artery without angina pectoris: Secondary | ICD-10-CM

## 2012-05-16 NOTE — Progress Notes (Signed)
Patient Care Team: Jacques Navy, MD as PCP - General   HPI  Jeremy [name] is a 62 y.o. male Seen in followup for syncope. He has had 2 episodes one remotely and one more recently in [place].   He also is a history of coronary artery disease in his distal RCA was treated with an Taxus drug-eluting stent at the site after his first syncopal episode.Marland Kitchen He also had stenting of his proximal RCA and of his LAD. Because this was found unsuspectedly, he was admitted for repeat catheterization following a syncopal episode which demonstrated no significant progression of his coronary disease.  The patient denies chest pain, shortness of breath, nocturnal dyspnea, orthopnea or peripheral edema. There have been no palpitations, lightheadedness or syncope.    Past Medical History  Diagnosis Date  . Coronary artery disease     Cardiac catheterization 8/07: Proximal LAD 50-75%, mid LAD 60%, ostial septal perforator 80%, proximal RCA 50% and then 80-85%, distal RCA 80-90%, EF 55-60%.  PCI was performed at that time colon distal RCA treated with a Taxus drug-eluting stent.  The proximal RCA treated with a Taxus drug eluting stent.  The LAD was studied with IVUS and treated with a Taxus drug eluding stent.    . Syncope   . Hyperlipidemia   . Ill-defined closed fractures of upper limb   . Sinus bradycardia     Past Surgical History  Procedure Laterality Date  . Appendectomy    . Coronary angioplasty      Current Outpatient Prescriptions  Medication Sig Dispense Refill  . aspirin 81 MG tablet Take 81 mg by mouth daily.        . clopidogrel (PLAVIX) 75 MG tablet Take 1 tablet (75 mg total) by mouth daily.  90 tablet  3  . fluticasone (FLONASE) 50 MCG/ACT nasal spray Place 2 sprays into the nose as directed.  48 g  3  . nitroGLYCERIN (NITROSTAT) 0.4 MG SL tablet Place 1 tablet (0.4 mg total) under the tongue every 5 (five) minutes as needed for chest pain.  25 tablet  1  . rosuvastatin (CRESTOR) 20  MG tablet Take 1 tablet (20 mg total) by mouth at bedtime.  90 tablet  3   No current facility-administered medications for this visit.    No Known Allergies  Review of Systems negative except from HPI and PMH  Physical Exam BP 108/65  Pulse 67  Ht 5' 10.5" (1.791 m)  Wt 193 lb (87.544 kg)  BMI 27.29 kg/m2 Well developed and nourished in no acute distress HENT normal Neck supple with JVP-flat Clear Regular rate and rhythm, no murmurs or gallops Abd-soft with active BS No Clubbing cyanosis edema Skin-warm and dry A & Oriented  Grossly normal sensory and motor function  ECG demonstrates sinus rhythm at 60 Intervals 15/10/40 Axis LXVIII Otherwise normal   Assessment and  Plan

## 2012-05-16 NOTE — Patient Instructions (Addendum)
Your physician wants you to follow-up in: ONE YEAR WITH DR KLEIN You will receive a reminder letter in the mail two months in advance. If you don't receive a letter, please call our office to schedule the follow-up appointment.    

## 2012-05-16 NOTE — Assessment & Plan Note (Signed)
No recurrent syncope 

## 2012-05-16 NOTE — Assessment & Plan Note (Signed)
Continue current meds 

## 2012-05-17 ENCOUNTER — Ambulatory Visit: Payer: 59 | Admitting: Internal Medicine

## 2012-07-23 ENCOUNTER — Encounter: Payer: Self-pay | Admitting: Internal Medicine

## 2012-12-06 ENCOUNTER — Other Ambulatory Visit: Payer: Self-pay

## 2013-02-27 ENCOUNTER — Encounter: Payer: Self-pay | Admitting: Internal Medicine

## 2013-03-04 ENCOUNTER — Encounter: Payer: Self-pay | Admitting: Internal Medicine

## 2013-03-27 ENCOUNTER — Encounter: Payer: Self-pay | Admitting: *Deleted

## 2013-04-01 ENCOUNTER — Encounter: Payer: Self-pay | Admitting: Internal Medicine

## 2013-04-01 ENCOUNTER — Telehealth: Payer: Self-pay

## 2013-04-01 ENCOUNTER — Ambulatory Visit (INDEPENDENT_AMBULATORY_CARE_PROVIDER_SITE_OTHER): Payer: 59 | Admitting: Internal Medicine

## 2013-04-01 VITALS — BP 112/62 | HR 78 | Ht 69.5 in | Wt 188.0 lb

## 2013-04-01 DIAGNOSIS — R143 Flatulence: Secondary | ICD-10-CM

## 2013-04-01 DIAGNOSIS — IMO0001 Reserved for inherently not codable concepts without codable children: Secondary | ICD-10-CM

## 2013-04-01 DIAGNOSIS — Z86010 Personal history of colonic polyps: Secondary | ICD-10-CM

## 2013-04-01 DIAGNOSIS — Z7902 Long term (current) use of antithrombotics/antiplatelets: Secondary | ICD-10-CM | POA: Insufficient documentation

## 2013-04-01 DIAGNOSIS — R142 Eructation: Secondary | ICD-10-CM

## 2013-04-01 DIAGNOSIS — R141 Gas pain: Secondary | ICD-10-CM

## 2013-04-01 MED ORDER — NA SULFATE-K SULFATE-MG SULF 17.5-3.13-1.6 GM/177ML PO SOLN: ORAL | Status: DC

## 2013-04-01 NOTE — Telephone Encounter (Signed)
[facility] [address] [place] [id number]  04/01/2013   RE: Jeremy [name] DOB: 1950/02/06 MRN: [id number]   Dear Sherryl MangesSteven Klein M.D.,    We have scheduled the above patient for an endoscopic procedure. Our records show that he is on anticoagulation therapy.   Please advise as to how long the patient may come off his therapy of Plavix prior to the colonoscopy procedure, which is scheduled for May .  Please fax back/ or route the completed form to Brighid Koch [place], CMA (AAMA) at [phone].   Sincerely,    Dr. Stan Headarl Gessner

## 2013-04-01 NOTE — Patient Instructions (Addendum)
It has been recommended to you by your physician that you have a(n) colonoscopy completed. Per your request, we did not schedule the procedure(s) today. Please contact our office at [phone] should you decide to have the procedure completed.  We will contact Dr. Clide CliffKline to get clearance for holding your Plavix so that we will have that done prior to your pre-visit.   Purchase an over the counter probiotic and take it for daily for a month.  Today we are giving you a handout to read and follow on gas prevention.   I appreciate the opportunity to care for you.

## 2013-04-01 NOTE — Progress Notes (Signed)
         Subjective:    Patient ID: Jeremy [name], male    DOB: 09-18-1950, 63 y.o.   MRN: [id number]  HPI Patient see her for followup, last seen about 5-6 years ago at which point he had a colonoscopy showing a diminutive adenoma. He complains of some bloating and foul-smelling gas at times which are a minor nuisance. Otherwise his GI history is negative. He takes clopidogrel because several more years ago he has had drug-eluting stents placed. He is not having any cardiac problems now. No chest pain or dyspnea reported.  Medications, allergies, past medical history, past surgical history, family history and social history are reviewed and updated in the EMR.  Review of Systems As per history of present illness.    Objective:   Physical Exam General:  NAD Lungs:  clear Heart:  S1S2 no rubs, murmurs or gallops Abdomen:  soft and nontender, BS+, no HSM/mass Ext:   no edema    Data Reviewed:  Last colonoscopy 2009, 2004 colonoscopy, pathology reports        Assessment & Plan:   1. Gas   2. Personal history of colonic polyps   3. Long term current use of antithrombotics/antiplatelets - clopidogrel - cardiac stents    Routine repeat colonoscopy is reasonable and appropriate at this time.The risks and benefits as well as alternatives of endoscopic procedure(s) have been discussed and reviewed. All questions answered. The patient agrees to proceed. He understands and is a very small risk of cardiac event off Plavix, we'll continue his aspirin and went to hold Plavix 5-7 days prior to colonoscopy as long as his cardiologist, Dr. Graciela HusbandsKlein agrees. I will look for recurrent polyps.  Suggested a trial of probiotic for a month for his gas complaints and also to review the low gas and flatulence diet to see what foods might be triggering that and may be eliminated.

## 2013-04-08 NOTE — Telephone Encounter (Signed)
Can hold plavix as necessary

## 2013-04-09 NOTE — Telephone Encounter (Signed)
noted 

## 2013-04-11 ENCOUNTER — Encounter: Payer: Self-pay | Admitting: Internal Medicine

## 2013-04-11 NOTE — Telephone Encounter (Signed)
Sir since this is cleared would you like him to hold it 5 days or 7?

## 2013-04-11 NOTE — Telephone Encounter (Signed)
5 days ok

## 2013-04-11 NOTE — Telephone Encounter (Signed)
Patient has tentative pre-op visit for 06/03/13, we will make him aware of this clearance at that time.

## 2013-04-18 ENCOUNTER — Ambulatory Visit (INDEPENDENT_AMBULATORY_CARE_PROVIDER_SITE_OTHER): Payer: 59 | Admitting: Internal Medicine

## 2013-04-18 ENCOUNTER — Encounter: Payer: Self-pay | Admitting: Internal Medicine

## 2013-04-18 ENCOUNTER — Other Ambulatory Visit: Payer: Self-pay | Admitting: Internal Medicine

## 2013-04-18 VITALS — BP 136/72 | HR 62 | Temp 97.9°F | Wt 192.4 lb

## 2013-04-18 DIAGNOSIS — Z23 Encounter for immunization: Secondary | ICD-10-CM

## 2013-04-18 DIAGNOSIS — Z7902 Long term (current) use of antithrombotics/antiplatelets: Secondary | ICD-10-CM

## 2013-04-18 DIAGNOSIS — I2510 Atherosclerotic heart disease of native coronary artery without angina pectoris: Secondary | ICD-10-CM

## 2013-04-18 DIAGNOSIS — E785 Hyperlipidemia, unspecified: Secondary | ICD-10-CM

## 2013-04-18 DIAGNOSIS — Z86010 Personal history of colonic polyps: Secondary | ICD-10-CM

## 2013-04-18 DIAGNOSIS — Z0000 Encounter for general adult medical examination without abnormal findings: Secondary | ICD-10-CM

## 2013-04-18 NOTE — Progress Notes (Signed)
Subjective:    Patient ID: Jeremy [name], male    DOB: 04/15/1950, 63 y.o.   MRN: [id number]  HPI Mr. Rodas presents for a general wellness follow-up. It has been a good year w/o illness, no surgery, no injury - blessedly uneventful. Current with dental and eye care. Healthy diet. Exercise 3 days a week - treadmill. Things are good on home front -and work. LIfe is good.  Past Medical History  Diagnosis Date  . Coronary artery disease     Cardiac catheterization 8/07: Proximal LAD 50-75%, mid LAD 60%, ostial septal perforator 80%, proximal RCA 50% and then 80-85%, distal RCA 80-90%, EF 55-60%.  PCI was performed at that time colon distal RCA treated with a Taxus drug-eluting stent.  The proximal RCA treated with a Taxus drug eluting stent.  The LAD was studied with IVUS and treated with a Taxus drug eluding stent.    . Syncope   . Hyperlipidemia   . Ill-defined closed fractures of upper limb   . Sinus bradycardia   . Colon polyp 07/05/2007    TUBULAR ADENOMA   Past Surgical History  Procedure Laterality Date  . Appendectomy    . Coronary angioplasty    . Colonoscopy w/ biopsies      2004 and 2009   Family History  Problem Relation Age of Onset  . Prostate cancer Father   . Cancer Father     prostate cancer w/ mets  . Heart disease Father     CAD  . Hyperlipidemia Father   . Heart attack Paternal Grandmother   . Stroke Mother   . Cancer Maternal Uncle     colon cancer  . COPD Neg Hx   . Diabetes Neg Hx    History   Social History  . Marital Status: Married    Spouse Name: N/A    Number of Children: 0  . Years of Education: 16   Occupational History  . financial planner    Social History Main Topics  . Smoking status: Former Games developermoker  . Smokeless tobacco: Never Used  . Alcohol Use: Yes     Comment: occasional  . Drug Use: No  . Sexual Activity: Yes    Partners: Female   Other Topics Concern  . Not on file   Social History Narrative   [employer], [employer] [place]. Married ''89. No children. Occupation: Forensic scientistfinancial planner    Current Outpatient Prescriptions on File Prior to Visit  Medication Sig Dispense Refill  . aspirin 81 MG tablet Take 81 mg by mouth daily.        . fluticasone (FLONASE) 50 MCG/ACT nasal spray Place 2 sprays into the nose as directed.  48 g  3   No current facility-administered medications on file prior to visit.      Review of Systems Constitutional:  Negative for fever, chills, activity change and unexpected weight change.  HEENT:  Negative for hearing loss, ear pain, congestion, neck stiffness and postnasal drip. Negative for sore throat or swallowing problems. Negative for dental complaints.   Eyes: Negative for vision loss or change in visual acuity.  Respiratory: Negative for chest tightness and wheezing. Negative for DOE.   Cardiovascular: Negative for chest pain or palpitations. No decreased exercise tolerance Gastrointestinal: No change in bowel habit. No bloating or gas. No reflux or indigestion Genitourinary: Negative for urgency, frequency, flank pain and difficulty urinating.  Musculoskeletal: Negative for myalgias, back pain, arthralgias and gait problem.  Neurological: Negative for dizziness, tremors, weakness and headaches.  Hematological: Negative for adenopathy.  Psychiatric/Behavioral: Negative for behavioral problems and dysphoric mood.       Objective:   Physical Exam Filed Vitals:   04/18/13 1442  BP: 136/72  Pulse: 62  Temp: 97.9 F (36.6 C)   Wt Readings from Last 3 Encounters:  04/18/13 192 lb 6.4 oz (87.272 kg)  04/01/13 188 lb (85.276 kg)  05/16/12 193 lb (87.544 kg)   Gen'l: Well nourished well developed male in no acute distress  HEENT: Head: Normocephalic and atraumatic. Right Ear: External ear normal. EAC/TM nl. Left Ear: External ear normal.  EAC/TM nl. Nose: Nose normal. Mouth/Throat: Oropharynx is clear and moist. Dentition - native, in good repair. No buccal or  palatal lesions. Posterior pharynx clear. Eyes: Conjunctivae and sclera clear. EOM intact. Pupils are equal, round, and reactive to light. Right eye exhibits no discharge. Left eye exhibits no discharge. Neck: Normal range of motion. Neck supple. No JVD present. No tracheal deviation present. No thyromegaly present.  Cardiovascular: Normal rate, regular rhythm, no gallop, no friction rub, no murmur heard.      Quiet precordium. 2+ radial and DP pulses . No carotid bruits Pulmonary/Chest: Effort normal. No respiratory distress or increased WOB, no wheezes, no rales. No chest wall deformity or CVAT. Abdomen: Soft. Bowel sounds are normal in all quadrants. He exhibits no distension, no tenderness, no rebound or guarding, No heptosplenomegaly  Genitourinary:  deferred Musculoskeletal: Normal range of motion. He exhibits no edema and no tenderness.       Small and large joints without redness, synovial thickening or deformity. Full range of motion preserved about all small, median and large joints.  Lymphadenopathy:    He has no cervical or supraclavicular adenopathy.  Neurological: He is alert and oriented to person, place, and time. CN II-XII intact. DTRs 2+ and symmetrical biceps, radial and patellar tendons. Cerebellar function normal with no tremor, rigidity, normal gait and station.  Skin: Skin is warm and dry. No rash noted. No erythema.  Psychiatric: He has a normal mood and affect. His behavior is normal. Thought content normal.          Assessment & Plan:

## 2013-04-18 NOTE — Patient Instructions (Signed)
Thanks for coming in to see me today and all these years.  Your exam is normal  Routine lab today results will be posted to MyChart.  Health Maintenance - colon coming up; immunizations current with Prevnar pneumonia vaccine today.   Continuing care with Dr. Erskine EmerySteve Fry

## 2013-04-18 NOTE — Progress Notes (Signed)
Pre visit review using our clinic review tool, if applicable. No additional management support is needed unless otherwise documented below in the visit note. 

## 2013-04-21 DIAGNOSIS — Z1211 Encounter for screening for malignant neoplasm of colon: Secondary | ICD-10-CM | POA: Insufficient documentation

## 2013-04-21 DIAGNOSIS — Z0000 Encounter for general adult medical examination without abnormal findings: Secondary | ICD-10-CM | POA: Insufficient documentation

## 2013-04-21 NOTE — Assessment & Plan Note (Signed)
Patient is in contact with GI for scheduling follow up colonoscopy for May 21st '15.

## 2013-04-21 NOTE — Assessment & Plan Note (Signed)
Taking and tolerating "Statin" therapy w/o adverse affects. Last lab 1 yr ago with LDL 72.  Plan Follow up lab with recommendations to follow.

## 2013-04-21 NOTE — Assessment & Plan Note (Signed)
Interval history - unremarkable, he has done well. Physical exam is normal. Lab is ordered and pending. He is current with colorectal cancer screening. Discussed pros and cons of prostate cancer screening (USPHCTF recommendations reviewed and AUA  April '13 recommendations) and he defers evaluation at this time. Immunizations are current with prevnar being given today.  In summary A very nice man who is medically stable. He will return to Dr. Clent RidgesFry, at [facility], in 1 year or sooner as needed.

## 2013-04-21 NOTE — Assessment & Plan Note (Signed)
Stable w/o cardiac symptoms. He is physically active.   Plan Continue risk factor modification

## 2013-04-25 ENCOUNTER — Other Ambulatory Visit (INDEPENDENT_AMBULATORY_CARE_PROVIDER_SITE_OTHER): Payer: 59

## 2013-04-25 DIAGNOSIS — E785 Hyperlipidemia, unspecified: Secondary | ICD-10-CM

## 2013-04-25 LAB — LIPID PANEL
CHOL/HDL RATIO: 3
Cholesterol: 138 mg/dL (ref 0–200)
HDL: 42.7 mg/dL (ref >39.00)
LDL CALC: 77 mg/dL (ref 0–99)
Triglycerides: 90 mg/dL (ref 0.0–149.0)
VLDL: 18 mg/dL (ref 0.0–40.0)

## 2013-04-25 LAB — COMPREHENSIVE METABOLIC PANEL
ALT: 26 U/L (ref 0–53)
AST: 23 U/L (ref 0–37)
Albumin: 4.3 g/dL (ref 3.5–5.2)
Alkaline Phosphatase: 80 U/L (ref 39–117)
BUN: 17 mg/dL (ref 6–23)
CALCIUM: 9.4 mg/dL (ref 8.4–10.5)
CHLORIDE: 101 meq/L (ref 96–112)
CO2: 31 mEq/L (ref 19–32)
Creatinine, Ser: 1 mg/dL (ref 0.4–1.5)
GFR: 83.83 mL/min (ref >60.00)
Glucose, Bld: 112 mg/dL — ABNORMAL HIGH (ref 70–99)
Potassium: 4.6 mEq/L (ref 3.5–5.1)
Sodium: 138 mEq/L (ref 135–145)
Total Bilirubin: 0.8 mg/dL (ref 0.3–1.2)
Total Protein: 7.3 g/dL (ref 6.0–8.3)

## 2013-05-06 ENCOUNTER — Encounter: Payer: Self-pay | Admitting: Internal Medicine

## 2013-05-06 ENCOUNTER — Ambulatory Visit (INDEPENDENT_AMBULATORY_CARE_PROVIDER_SITE_OTHER): Payer: 59 | Admitting: Internal Medicine

## 2013-05-06 VITALS — BP 122/64 | HR 53 | Ht 69.5 in | Wt 191.0 lb

## 2013-05-06 DIAGNOSIS — I2510 Atherosclerotic heart disease of native coronary artery without angina pectoris: Secondary | ICD-10-CM

## 2013-05-06 DIAGNOSIS — R55 Syncope and collapse: Secondary | ICD-10-CM

## 2013-05-06 NOTE — Progress Notes (Signed)
      Patient Care Team: Nelwyn SalisburyStephen A Fry, MD as PCP - General (Family Medicine)   HPI  Jeremy [name] is a 63 y.o. male Seen in followup for syncope. He has had 2 episodes one remotely [place]  He also is a history of coronary artery disease in his distal RCA was treated with an Taxus drug-eluting stent at the site after his first syncopal episode.Marland Kitchen. He also had stenting of his proximal RCA and of his LAD. Because this was found unsuspectedly, he was admitted for repeat catheterization following a syncopal episode which demonstrated no significant progression of his coronary disease.   The patient denies chest pain, shortness of breath, nocturnal dyspnea, orthopnea or peripheral edema. There have been no palpitations, lightheadedness or syncope.   I reviewed his catheterization data from 2007 with Dr. Erlinda [facility]MC.   Past Medical History  Diagnosis Date  . Coronary artery disease     Cardiac catheterization 8/07: Proximal LAD 50-75%, mid LAD 60%, ostial septal perforator 80%, proximal RCA 50% and then 80-85%, distal RCA 80-90%, EF 55-60%.  PCI was performed at that time colon distal RCA treated with a Taxus drug-eluting stent.  The proximal RCA treated with a Taxus drug eluting stent.  The LAD was studied with IVUS and treated with a Taxus drug eluding stent.    . Syncope   . Hyperlipidemia   . Ill-defined closed fractures of upper limb   . Sinus bradycardia   . Colon polyp 07/05/2007    TUBULAR ADENOMA    Past Surgical History  Procedure Laterality Date  . Appendectomy    . Coronary angioplasty    . Colonoscopy w/ biopsies      2004 and 2009    Current Outpatient Prescriptions  Medication Sig Dispense Refill  . aspirin 81 MG tablet Take 81 mg by mouth daily.        . clopidogrel (PLAVIX) 75 MG tablet TAKE 1 TABLET DAILY  90 tablet  0  . CRESTOR 20 MG tablet TAKE 1 TABLET AT BEDTIME  90 tablet  0  . fluticasone (FLONASE) 50 MCG/ACT nasal spray Place 2 sprays  into the nose as directed.  48 g  3  . Ibuprofen (ADVIL) 200 MG CAPS Take 1 capsule by mouth as needed.      . Probiotic Product (PROBIOTIC DAILY PO) Take 1 capsule by mouth daily.       No current facility-administered medications for this visit.    No Known Allergies  Review of Systems negative except from HPI and PMH  Physical Exam BP 122/64  Pulse 53  Ht 5' 9.5" (1.765 m)  Wt 191 lb (86.637 kg)  BMI 27.81 kg/m2 Well developed and nourished in no acute distress HENT normal Neck supple with JVP-flat Clear Regular rate and rhythm, no murmurs or gallops Abd-soft with active BS No Clubbing cyanosis edema Skin-warm and dry A & Oriented  Grossly normal sensory and motor function  ECG demonstrates sinus rhythm 53 Intervals 16/11/41 RSR prime Otherwise normal   Assessment and  Plan  Syncope  Coronary artery disease status post Taxus stenting RCA/LAD 2007  Hyperlipidemia  No intercurrent syncope. I have reviewed with Dr. Erlinda Hong[facility] antiplatelet therapies; it was his recommendation with Taxus stenting a dual antiplatelet therapy be continued. LDL is near target 77. Continue current medications

## 2013-05-06 NOTE — Patient Instructions (Signed)
Your physician recommends that you continue on your current medications as directed. Please refer to the Current Medication list given to you today.  Your physician wants you to follow-up in: 1 year with Dr. Klein.  You will receive a reminder letter in the mail two months in advance. If you don't receive a letter, please call our office to schedule the follow-up appointment.  

## 2013-06-03 ENCOUNTER — Ambulatory Visit (AMBULATORY_SURGERY_CENTER): Payer: Self-pay | Admitting: *Deleted

## 2013-06-03 VITALS — Ht 69.5 in | Wt 188.8 lb

## 2013-06-03 DIAGNOSIS — Z86010 Personal history of colonic polyps: Secondary | ICD-10-CM

## 2013-06-03 MED ORDER — NA SULFATE-K SULFATE-MG SULF 17.5-3.13-1.6 GM/177ML PO SOLN: ORAL | Status: DC

## 2013-06-03 NOTE — Progress Notes (Signed)
Patient denies any allergies to eggs or soy. Patient denies any problems with anesthesia/sedation. Pt denies any oxygen use at home. Does not take diet/weight loss medications. EMMI education assisgned to patient on colonoscopy. 

## 2013-06-13 ENCOUNTER — Encounter: Payer: Self-pay | Admitting: Internal Medicine

## 2013-06-20 ENCOUNTER — Ambulatory Visit (AMBULATORY_SURGERY_CENTER): Payer: 59 | Admitting: Internal Medicine

## 2013-06-20 ENCOUNTER — Encounter: Payer: Self-pay | Admitting: Internal Medicine

## 2013-06-20 VITALS — BP 113/65 | HR 47 | Temp 96.5°F | Resp 19 | Ht 69.5 in | Wt 188.0 lb

## 2013-06-20 DIAGNOSIS — Z86010 Personal history of colonic polyps: Secondary | ICD-10-CM

## 2013-06-20 HISTORY — PX: COLONOSCOPY: SHX174

## 2013-06-20 MED ORDER — SODIUM CHLORIDE 0.9 % IV SOLN: 500.0000 mL | INTRAVENOUS | Status: DC

## 2013-06-20 NOTE — Patient Instructions (Addendum)
No polyps today!  YOU HAD AN ENDOSCOPIC PROCEDURE TODAY AT THE [facility]: Refer to the procedure report that was given to you for any specific questions about what was found during the examination.  If the procedure report does not answer your questions, please call your gastroenterologist to clarify.  If you requested that your care partner not be given the details of your procedure findings, then the procedure report has been included in a sealed envelope for you to review at your convenience later.  YOU SHOULD EXPECT: Some feelings of bloating in the abdomen. Passage of more gas than usual.  Walking can help get rid of the air that was put into your GI tract during the procedure and reduce the bloating. If you had a lower endoscopy (such as a colonoscopy or flexible sigmoidoscopy) you may notice spotting of blood in your stool or on the toilet paper. If you underwent a bowel prep for your procedure, then you may not have a normal bowel movement for a few days.  DIET: Your first meal following the procedure should be a light meal and then it is ok to progress to your normal diet.  A half-sandwich or bowl of soup is an example of a good first meal.  Heavy or fried foods are harder to digest and may make you feel nauseous or bloated.  Likewise meals heavy in dairy and vegetables can cause extra gas to form and this can also increase the bloating.  Drink plenty of fluids but you should avoid alcoholic beverages for 24 hours.  ACTIVITY: Your care partner should take you home directly after the procedure.  You should plan to take it easy, moving slowly for the rest of the day.  You can resume normal activity the day after the procedure however you should NOT DRIVE or use heavy machinery for 24 hours (because of the sedation medicines used during the test).    SYMPTOMS TO REPORT IMMEDIATELY: A gastroenterologist can be reached at any hour.  During normal business hours, 8:30 AM to 5:00 PM  Monday through Friday, call [phone].  After hours and on weekends, please call the GI answering service at [phone] who will take a message and have the physician on call contact you.   Following lower endoscopy (colonoscopy or flexible sigmoidoscopy):  Excessive amounts of blood in the stool  Significant tenderness or worsening of abdominal pains  Swelling of the abdomen that is new, acute  Fever of 100F or higher   FOLLOW UP: If any biopsies were taken you will be contacted by phone or by letter within the next 1-3 weeks.  Call your gastroenterologist if you have not heard about the biopsies in 3 weeks.  Our staff will call the home number listed on your records the next business day following your procedure to check on you and address any questions or concerns that you may have at that time regarding the information given to you following your procedure. This is a courtesy call and so if there is no answer at the home number and we have not heard from you through the emergency physician on call, we will assume that you have returned to your regular daily activities without incident.  SIGNATURES/CONFIDENTIALITY: You and/or your care partner have signed paperwork which will be entered into your electronic medical record.  These signatures attest to the fact that that the information above on your After Visit Summary has been reviewed and is understood.  Full responsibility  of the confidentiality of this discharge information lies with you and/or your care-partner.  Normal exam  Repeat colonoscopy in 10 years-2025.  Resume all medications.

## 2013-06-20 NOTE — Op Note (Signed)
[facility] [address] [place], [id number]   COLONOSCOPY PROCEDURE REPORT  PATIENT: Jeremy [name], Jeremy C.  MR#: [id number] BIRTHDATE: Apr 15, 1950 , 63  yrs. old GENDER: Male ENDOSCOPIST: Iva Booparl E Tavaughn Silguero, MD, [facility] PROCEDURE DATE:  06/20/2013 PROCEDURE:   Colonoscopy, surveillance First Screening Colonoscopy - Avg.  risk and is 50 yrs.  old or older - No.  Prior Negative Screening - Now for repeat screening. N/A  History of Adenoma - Now for follow-up colonoscopy & has been > or = to 3 yrs.  Yes hx of adenoma.  Has been 3 or more years since last colonoscopy.  Polyps Removed Today? No.  Recommend repeat exam, <10 yrs? No. ASA CLASS:   Class III INDICATIONS:Patient's personal history of adenomatous colon polyps.  MEDICATIONS: propofol (Diprivan) 200mg  IV  DESCRIPTION OF PROCEDURE:   After the risks benefits and alternatives of the procedure were thoroughly explained, informed consent was obtained.  A digital rectal exam revealed no abnormalities of the rectum, A digital rectal exam revealed no prostatic nodules, and A digital rectal exam revealed the prostate was not enlarged.   The LB [id number]  endoscope was introduced through the anus and advanced to the cecum, which was identified by both the appendix and ileocecal valve. No adverse events experienced.   The quality of the prep was excellent using Suprep  The instrument was then slowly withdrawn as the colon was fully examined.      COLON FINDINGS: A normal appearing cecum, ileocecal valve, and appendiceal orifice were identified.  The ascending, hepatic flexure, transverse, splenic flexure, descending, sigmoid colon and rectum appeared unremarkable.  No polyps or cancers were seen.   A right colon retroflexion was performed.  Retroflexed views revealed no abnormalities. The time to cecum=4 minutes 31 seconds. Withdrawal time=7 minutes 20 seconds.  The scope was withdrawn and the procedure  completed. COMPLICATIONS: There were no complications.  ENDOSCOPIC IMPRESSION: Normal colonoscopy in patient with prior diminutive adenoma 2009  RECOMMENDATIONS: Repeat colonoscopy 10 years - 2025 resume all medications   eSigned:  Iva Booparl E Angell Honse, MD, [facility] 06/20/2013 11:13 AM   cc: The Patient

## 2013-06-21 ENCOUNTER — Telehealth: Payer: Self-pay

## 2013-06-21 NOTE — Telephone Encounter (Signed)
  Follow up Call-  Call back number 06/20/2013  Post procedure Call Back phone  # [phone]  Permission to leave phone message Yes     Patient questions:  Do you have a fever, pain , or abdominal swelling? no Pain Score  0 *  Have you tolerated food without any problems? yes  Have you been able to return to your normal activities? yes  Do you have any questions about your discharge instructions: Diet   no Medications  no Follow up visit  no  Do you have questions or concerns about your Care? no  Actions: * If pain score is 4 or above: No action needed, pain <4.

## 2013-07-16 ENCOUNTER — Telehealth: Payer: Self-pay | Admitting: Family Medicine

## 2013-07-16 NOTE — Telephone Encounter (Signed)
[facility] - [place], [place] - [address] is requesting 90 day re-fill on CRESTOR 20 MG tablet And clopidogrel (PLAVIX) 75 MG tablet

## 2013-07-17 NOTE — Telephone Encounter (Signed)
Looks like you have not seen this pt yet, can we send in refills?

## 2013-07-17 NOTE — Telephone Encounter (Signed)
Refill for 90 days ONLY. They need an OV soon

## 2013-07-18 MED ORDER — CLOPIDOGREL BISULFATE 75 MG PO TABS: ORAL_TABLET | ORAL | Status: DC

## 2013-07-18 MED ORDER — ROSUVASTATIN CALCIUM 20 MG PO TABS: ORAL_TABLET | ORAL | Status: DC

## 2013-07-18 NOTE — Telephone Encounter (Signed)
I sent scripts e-scribe and left a message for pt to schedule a follow up.

## 2013-09-05 ENCOUNTER — Encounter: Payer: Self-pay | Admitting: Family Medicine

## 2013-09-05 ENCOUNTER — Ambulatory Visit (INDEPENDENT_AMBULATORY_CARE_PROVIDER_SITE_OTHER): Payer: 59 | Admitting: Family Medicine

## 2013-09-05 VITALS — BP 119/66 | HR 55 | Temp 98.3°F | Ht 69.5 in | Wt 188.0 lb

## 2013-09-05 DIAGNOSIS — I2510 Atherosclerotic heart disease of native coronary artery without angina pectoris: Secondary | ICD-10-CM

## 2013-09-05 DIAGNOSIS — I25118 Atherosclerotic heart disease of native coronary artery with other forms of angina pectoris: Secondary | ICD-10-CM

## 2013-09-05 DIAGNOSIS — R001 Bradycardia, unspecified: Secondary | ICD-10-CM

## 2013-09-05 DIAGNOSIS — R55 Syncope and collapse: Secondary | ICD-10-CM

## 2013-09-05 DIAGNOSIS — I209 Angina pectoris, unspecified: Secondary | ICD-10-CM

## 2013-09-05 DIAGNOSIS — I498 Other specified cardiac arrhythmias: Secondary | ICD-10-CM

## 2013-09-05 DIAGNOSIS — B351 Tinea unguium: Secondary | ICD-10-CM

## 2013-09-05 DIAGNOSIS — E785 Hyperlipidemia, unspecified: Secondary | ICD-10-CM

## 2013-09-05 MED ORDER — TERBINAFINE HCL 250 MG PO TABS: 250.0000 mg | ORAL_TABLET | Freq: Every day | ORAL | Status: DC
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
## 2013-09-05 NOTE — Progress Notes (Signed)
Pre visit review using our clinic review tool, if applicable. No additional management support is needed unless otherwise documented below in the visit note. 

## 2013-09-27 ENCOUNTER — Other Ambulatory Visit: Payer: Self-pay | Admitting: Family Medicine

## 2013-10-30 ENCOUNTER — Encounter: Payer: Self-pay | Admitting: Internal Medicine

## 2013-12-07 ENCOUNTER — Other Ambulatory Visit: Payer: Self-pay | Admitting: Family Medicine

## 2014-02-18 ENCOUNTER — Encounter: Payer: Self-pay | Admitting: Family Medicine

## 2014-02-18 ENCOUNTER — Ambulatory Visit (INDEPENDENT_AMBULATORY_CARE_PROVIDER_SITE_OTHER): Payer: 59 | Admitting: Family Medicine

## 2014-02-18 VITALS — BP 145/72 | HR 60 | Temp 97.8°F | Ht 69.5 in | Wt 190.0 lb

## 2014-02-18 DIAGNOSIS — W540XXA Bitten by dog, initial encounter: Secondary | ICD-10-CM

## 2014-02-18 DIAGNOSIS — T148 Other injury of unspecified body region: Secondary | ICD-10-CM

## 2014-02-18 DIAGNOSIS — Z23 Encounter for immunization: Secondary | ICD-10-CM

## 2014-02-18 MED ORDER — CEPHALEXIN 500 MG PO CAPS
500.0000 mg | ORAL_CAPSULE | Freq: Three times a day (TID) | ORAL | Status: AC
Start: 2014-02-18 — End: 2014-02-28

## 2014-02-18 NOTE — Progress Notes (Signed)
Pre visit review using our clinic review tool, if applicable. No additional management support is needed unless otherwise documented below in the visit note. 

## 2014-02-18 NOTE — Addendum Note (Signed)
Addended by: Aniceto BossNIMMONS, Zayn Selley A on: 02/18/2014 04:20 PM   Modules accepted: Orders

## 2014-02-18 NOTE — Progress Notes (Signed)
   Subjective:    Patient ID: Jeremy [name], male    DOB: 12-May-1950, 64 y.o.   MRN: [id number]  HPI Here to check a dog bite to the left hand that occurred 2 days ago. He has been applying Neosporin to it. It feels fine.    Review of Systems  Constitutional: Negative.   Skin: Positive for wound.       Objective:   Physical Exam  Constitutional: He appears well-developed and well-nourished.  Skin:  The left hand has a superficial abrasion over the 3rd and 4th knuckles. No bleeding, ROM is full           Assessment & Plan:  Cover with Keflex.

## 2014-05-09 ENCOUNTER — Ambulatory Visit (INDEPENDENT_AMBULATORY_CARE_PROVIDER_SITE_OTHER): Payer: 59 | Admitting: Adult Health

## 2014-05-09 ENCOUNTER — Encounter: Payer: Self-pay | Admitting: Adult Health

## 2014-05-09 DIAGNOSIS — L237 Allergic contact dermatitis due to plants, except food: Secondary | ICD-10-CM | POA: Diagnosis not present

## 2014-05-09 MED ORDER — PREDNISONE 20 MG PO TABS: 20.0000 mg | ORAL_TABLET | Freq: Every day | ORAL | Status: DC

## 2014-05-09 MED ORDER — CLOBETASOL PROPIONATE 0.05 % EX CREA: 1.0000 "application " | TOPICAL_CREAM | Freq: Two times a day (BID) | CUTANEOUS | Status: DC

## 2014-05-09 MED ORDER — METHYLPREDNISOLONE ACETATE 80 MG/ML IJ SUSP
80.0000 mg | Freq: Once | INTRAMUSCULAR | Status: AC
  Administered 2014-05-09: 80 mg via INTRAMUSCULAR

## 2014-05-09 NOTE — Progress Notes (Signed)
Pre visit review using our clinic review tool, if applicable. No additional management support is needed unless otherwise documented below in the visit note. 
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
## 2014-05-09 NOTE — Patient Instructions (Addendum)
Start the Prednisone, you will taper the medication every week. 60 mg for the first 7 days, 40 mg for the next 7 days and 20 mg for the final 7 days. Apply a thin layer of the steroid cream to areas on the hand, arms, trunk and legs. Do not apply the cream to the face, groin, or areas where the skin will rub together, such as the underarm and inner thigh.Also, do not use the creme for longer than two weeks.    You can use calamine lotion to help with the itching as well as take oatmeal baths. If you notice any signs or symptoms of infection, please let me know. If you do not notice any improvement in the next 2-3 days please let me know. If your symptoms get worse, let me know as well. I hope you feel better soon and have a Happy Birthday!   Poison [employer] Poison ivy is a inflammation of the skin (contact dermatitis) caused by touching the allergens on the leaves of the ivy plant following previous exposure to the plant. The rash usually appears 48 hours after exposure. The rash is usually bumps (papules) or blisters (vesicles) in a linear pattern. Depending on your own sensitivity, the rash may simply cause redness and itching, or it may also progress to blisters which may break open. These must be well cared for to prevent secondary bacterial (germ) infection, followed by scarring. Keep any open areas dry, clean, dressed, and covered with an antibacterial ointment if needed. The eyes may also get puffy. The puffiness is worst in the morning and gets better as the day progresses. This dermatitis usually heals without scarring, within 2 to 3 weeks without treatment. HOME CARE INSTRUCTIONS  Thoroughly wash with soap and water as soon as you have been exposed to poison ivy. You have about one half hour to remove the plant resin before it will cause the rash. This washing will destroy the oil or antigen on the skin that is causing, or will cause, the rash. Be sure to wash under your fingernails as any plant resin  there will continue to spread the rash. Do not rub skin vigorously when washing affected area. Poison ivy cannot spread if no oil from the plant remains on your body. A rash that has progressed to weeping sores will not spread the rash unless you have not washed thoroughly. It is also important to wash any clothes you have been wearing as these may carry active allergens. The rash will return if you wear the unwashed clothing, even several days later. Avoidance of the plant in the future is the best measure. Poison ivy plant can be recognized by the number of leaves. Generally, poison ivy has three leaves with flowering branches on a single stem. Diphenhydramine may be purchased over the counter and used as needed for itching. Do not drive with this medication if it makes you drowsy.Ask your caregiver about medication for children. SEEK MEDICAL CARE IF:  Open sores develop.  Redness spreads beyond area of rash.  You notice purulent (pus-like) discharge.  You have increased pain.  Other signs of infection develop (such as fever). Document Released: 01/15/2000 Document Revised: 04/11/2011 Document Reviewed: 06/27/2008 [facility] Patient Information 2015 [place]. This information is not intended to replace advice given to you by your health care provider. Make sure you discuss any questions you have with your health care provider.

## 2014-05-19 ENCOUNTER — Other Ambulatory Visit: Payer: Self-pay | Admitting: Family Medicine

## 2014-07-17 ENCOUNTER — Encounter: Payer: Self-pay | Admitting: Internal Medicine

## 2014-07-17 ENCOUNTER — Ambulatory Visit (INDEPENDENT_AMBULATORY_CARE_PROVIDER_SITE_OTHER): Payer: 59 | Admitting: Internal Medicine

## 2014-07-17 VITALS — BP 104/62 | HR 58 | Wt 187.6 lb

## 2014-07-17 DIAGNOSIS — R55 Syncope and collapse: Secondary | ICD-10-CM | POA: Diagnosis not present

## 2014-07-17 DIAGNOSIS — E785 Hyperlipidemia, unspecified: Secondary | ICD-10-CM | POA: Diagnosis not present

## 2014-07-17 LAB — TSH: TSH: 0.97 u[IU]/mL (ref 0.35–4.50)

## 2014-07-17 LAB — CBC
HCT: 42.6 % (ref 39.0–52.0)
Hemoglobin: 14 g/dL (ref 13.0–17.0)
MCHC: 32.9 g/dL (ref 30.0–36.0)
MCV: 89.6 fl (ref 78.0–100.0)
Platelets: 332 10*3/uL (ref 150.0–400.0)
RBC: 4.76 Mil/uL (ref 4.22–5.81)
RDW: 14.3 % (ref 11.5–15.5)
WBC: 6.3 10*3/uL (ref 4.0–10.5)

## 2014-07-17 LAB — COMPREHENSIVE METABOLIC PANEL
ALT: 22 U/L (ref 0–53)
AST: 22 U/L (ref 0–37)
Albumin: 4.2 g/dL (ref 3.5–5.2)
Alkaline Phosphatase: 86 U/L (ref 39–117)
BILIRUBIN TOTAL: 0.5 mg/dL (ref 0.2–1.2)
BUN: 13 mg/dL (ref 6–23)
CO2: 33 meq/L — AB (ref 19–32)
Calcium: 9.6 mg/dL (ref 8.4–10.5)
Chloride: 103 mEq/L (ref 96–112)
Creatinine, Ser: 0.95 mg/dL (ref 0.40–1.50)
GFR: 84.52 mL/min (ref >60.00)
Glucose, Bld: 75 mg/dL (ref 70–99)
Potassium: 5 mEq/L (ref 3.5–5.1)
Sodium: 140 mEq/L (ref 135–145)
TOTAL PROTEIN: 7.2 g/dL (ref 6.0–8.3)

## 2014-07-17 LAB — LIPID PANEL
CHOL/HDL RATIO: 3
Cholesterol: 143 mg/dL (ref 0–200)
HDL: 40.9 mg/dL (ref >39.00)
LDL CALC: 79 mg/dL (ref 0–99)
NonHDL: 102.1
Triglycerides: 115 mg/dL (ref 0.0–149.0)
VLDL: 23 mg/dL (ref 0.0–40.0)

## 2014-07-17 LAB — LDL CHOLESTEROL, DIRECT: Direct LDL: 79 mg/dL

## 2014-07-17 NOTE — Patient Instructions (Signed)
Medication Instructions:  Your physician recommends that you continue on your current medications as directed. Please refer to the Current Medication list given to you today.  Labwork: Your physician recommends that you return for lab work TODAY   Testing/Procedures: NONE  Follow-Up: Your physician wants you to follow-up in: 12 months with Dr. Graciela Husbands. You will receive a reminder letter in the mail two months in advance. If you don't receive a letter, please call our office to schedule the follow-up appointment.   Any Other Special Instructions Will Be Listed Below (If Applicable).

## 2014-07-17 NOTE — Progress Notes (Signed)
      Patient Care Team: Nelwyn Salisbury, MD as PCP - General (Family Medicine)   HPI  Jeremy [name] is a 64 y.o. male Seen in followup for syncope. He has had 2 episodes one remotely and one more recently in [place].  He also is a history of coronary artery disease in his distal RCA was treated with an Taxus drug-eluting stent at the site after his first syncopal episode.Marland Kitchen He also had stenting of his proximal RCA and of his LAD. Because this was found unsuspectedly, he was admitted for repeat catheterization following a syncopal episode which demonstrated no significant progression of his coronary disease.   The patient denies chest pain, shortness of breath, nocturnal dyspnea, orthopnea or peripheral edema. There have been no palpitations, lightheadedness or syncope.   I reviewed his catheterization data from 2007 with Dr. Erlinda Hong.   Past Medical History  Diagnosis Date  . Coronary artery disease     Cardiac catheterization 8/07: Proximal LAD 50-75%, mid LAD 60%, ostial septal perforator 80%, proximal RCA 50% and then 80-85%, distal RCA 80-90%, EF 55-60%.  PCI was performed at that time colon distal RCA treated with a Taxus drug-eluting stent.  The proximal RCA treated with a Taxus drug eluting stent.  The LAD was studied with IVUS and treated with a Taxus drug eluding stent.    . Syncope   . Hyperlipidemia   . Ill-defined closed fractures of upper limb   . Sinus bradycardia     sees Dr. Graciela Husbands   . Colon polyp 07/05/2007    TUBULAR ADENOMA    Past Surgical History  Procedure Laterality Date  . Appendectomy    . Coronary angioplasty    . Colonoscopy w/ biopsies      2004 and 2009    Current Outpatient Prescriptions  Medication Sig Dispense Refill  . aspirin 81 MG tablet Take 81 mg by mouth daily.      . clopidogrel (PLAVIX) 75 MG tablet TAKE 1 TABLET DAILY 90 tablet 0  . CRESTOR 20 MG tablet TAKE 1 TABLET DAILY 90 tablet 0  . fluticasone (FLONASE) 50 MCG/ACT nasal spray Place 2  sprays into the nose as directed. 48 g 3  . Ibuprofen (ADVIL) 200 MG CAPS Take 1 capsule by mouth as needed.    . Probiotic Product (PROBIOTIC DAILY PO) Take 1 capsule by mouth daily.     No current facility-administered medications for this visit.    No Known Allergies  Review of Systems negative except from HPI and PMH  Physical Exam BP 104/62 mmHg  Pulse 58  Wt 187 lb 9.6 oz (85.095 kg)  SpO2 96% Well developed and nourished in no acute distress HENT normal Neck supple with JVP-flat Clear Regular rate and rhythm, no murmurs or gallops Abd-soft with active BS No Clubbing cyanosis edema Skin-warm and dry A & Oriented  Grossly normal sensory and motor function  ECG demonstrates sinus rhythm 58 Intervals 15/10/41 RSR prime Otherwise normal   Assessment and  Plan  Syncope  Coronary artery disease status post Taxus stenting RCA/LAD 2007  Hyperlipidemia last checked 3/15  No intercurrent syncope.  Without symptoms of ischemia  I have reviewed with Dr. Erlinda Hong antiplatelet therapies; it was his recommendation with Taxus stenting a dual antiplatelet therapy be continued. LDL is near target 77. Continue current medications  We will check lipids and baseline blood work was not done last year

## 2014-08-17 ENCOUNTER — Other Ambulatory Visit: Payer: Self-pay | Admitting: Family Medicine

## 2014-08-18 NOTE — Telephone Encounter (Signed)
Do we handle these refills for pt?

## 2014-09-10 ENCOUNTER — Encounter: Payer: Self-pay | Admitting: Adult Health

## 2014-09-10 ENCOUNTER — Ambulatory Visit (INDEPENDENT_AMBULATORY_CARE_PROVIDER_SITE_OTHER): Payer: Medicare Other | Admitting: Adult Health

## 2014-09-10 VITALS — BP 110/74 | Temp 98.1°F | Ht 69.5 in | Wt 192.1 lb

## 2014-09-10 DIAGNOSIS — Z5189 Encounter for other specified aftercare: Secondary | ICD-10-CM | POA: Diagnosis not present

## 2014-09-10 NOTE — Progress Notes (Signed)
Pre visit review using our clinic review tool, if applicable. No additional management support is needed unless otherwise documented below in the visit note. 

## 2014-09-10 NOTE — Progress Notes (Signed)
Subjective:    Patient ID: Jeremy [name], male    DOB: 06/14/1950, 64 y.o.   MRN: [id number]  HPI 64 year old pleasant gentleman who presents to the office today for wound check of his left middle finger. He endorses that over a week ago that he scraped his finger on something ( unknown), which caused a small wound to the first joint of his left middle finger. He is worried because " it is not healing as fast as I think it should." Has been applying Neosporin to the area and covering it with a bandage.   Denies any redness, warmth, or drainage. Ha snot had any fevers.    Review of Systems  Constitutional: Negative.   Skin: Positive for wound.  All other systems reviewed and are negative.  Past Medical History  Diagnosis Date  . Coronary artery disease     Cardiac catheterization 8/07: Proximal LAD 50-75%, mid LAD 60%, ostial septal perforator 80%, proximal RCA 50% and then 80-85%, distal RCA 80-90%, EF 55-60%.  PCI was performed at that time colon distal RCA treated with a Taxus drug-eluting stent.  The proximal RCA treated with a Taxus drug eluting stent.  The LAD was studied with IVUS and treated with a Taxus drug eluding stent.    . Syncope   . Hyperlipidemia   . Ill-defined closed fractures of upper limb   . Sinus bradycardia     sees Dr. Graciela Husbands   . Colon polyp 07/05/2007    TUBULAR ADENOMA    Social History   Social History  . Marital Status: Married    Spouse Name: N/A  . Number of Children: 0  . Years of Education: 16   Occupational History  . financial planner    Social History Main Topics  . Smoking status: Former Games developer  . Smokeless tobacco: Never Used  . Alcohol Use: 0.0 oz/week    0 Standard drinks or equivalent per week     Comment: occ  . Drug Use: No  . Sexual Activity:    Partners: Female   Other Topics Concern  . Not on file   Social History Narrative   HS[employer], [employer] [place]. Married ''89. No children. Occupation: Programmer, systems    Past Surgical History  Procedure Laterality Date  . Appendectomy    . Coronary angioplasty    . Colonoscopy w/ biopsies      2004 and 2009    Family History  Problem Relation Age of Onset  . Prostate cancer Father   . Cancer Father     prostate cancer w/ mets  . Heart disease Father     CAD  . Hyperlipidemia Father   . Heart attack Paternal Grandmother   . Stroke Mother   . Colon cancer Maternal Uncle 60  . COPD Neg Hx   . Diabetes Neg Hx     No Known Allergies  Current Outpatient Prescriptions on File Prior to Visit  Medication Sig Dispense Refill  . aspirin 81 MG tablet Take 81 mg by mouth daily.      . clopidogrel (PLAVIX) 75 MG tablet TAKE 1 TABLET DAILY 90 tablet 3  . fluticasone (FLONASE) 50 MCG/ACT nasal spray Place 2 sprays into the nose as directed. 48 g 3  . Ibuprofen (ADVIL) 200 MG CAPS Take 1 capsule by mouth as needed.    . rosuvastatin (CRESTOR) 20 MG tablet TAKE 1 TABLET DAILY 90 tablet 3  . Probiotic Product (  PROBIOTIC DAILY PO) Take 1 capsule by mouth daily.     No current facility-administered medications on file prior to visit.    BP 110/74 mmHg  Temp(Src) 98.1 F (36.7 C) (Oral)  Ht 5' 9.5" (1.765 m)  Wt 192 lb 1.6 oz (87.136 kg)  BMI 27.97 kg/m2       Objective:   Physical Exam  Constitutional: He is oriented to person, place, and time. He appears well-developed and well-nourished. No distress.  Neurological: He is alert and oriented to person, place, and time.  Skin: Skin is warm and dry. He is not diaphoretic. No erythema.  Small well healing wound to left middle finger. No signs or symptoms of infection  Nursing note and vitals reviewed.     Assessment & Plan:  1. Visit for wound check - Continue to apply neosporin and band aid - Monitor for signs of infection  - No Abx therapy needed at this time.

## 2014-10-02 ENCOUNTER — Other Ambulatory Visit (INDEPENDENT_AMBULATORY_CARE_PROVIDER_SITE_OTHER): Payer: Medicare Other

## 2014-10-02 DIAGNOSIS — Z0000 Encounter for general adult medical examination without abnormal findings: Secondary | ICD-10-CM

## 2014-10-02 DIAGNOSIS — E785 Hyperlipidemia, unspecified: Secondary | ICD-10-CM | POA: Diagnosis not present

## 2014-10-02 LAB — CBC WITH DIFFERENTIAL/PLATELET
BASOS ABS: 0 10*3/uL (ref 0.0–0.1)
Basophils Relative: 0.5 % (ref 0.0–3.0)
EOS ABS: 0.2 10*3/uL (ref 0.0–0.7)
Eosinophils Relative: 3.5 % (ref 0.0–5.0)
HCT: 44.3 % (ref 39.0–52.0)
Hemoglobin: 14.9 g/dL (ref 13.0–17.0)
LYMPHS ABS: 1.7 10*3/uL (ref 0.7–4.0)
Lymphocytes Relative: 26.2 % (ref 12.0–46.0)
MCHC: 33.6 g/dL (ref 30.0–36.0)
MCV: 88.8 fl (ref 78.0–100.0)
MONO ABS: 0.7 10*3/uL (ref 0.1–1.0)
MONOS PCT: 11.2 % (ref 3.0–12.0)
NEUTROS ABS: 3.8 10*3/uL (ref 1.4–7.7)
NEUTROS PCT: 58.6 % (ref 43.0–77.0)
PLATELETS: 238 10*3/uL (ref 150.0–400.0)
RBC: 4.99 Mil/uL (ref 4.22–5.81)
RDW: 13.9 % (ref 11.5–15.5)
WBC: 6.5 10*3/uL (ref 4.0–10.5)

## 2014-10-02 LAB — POCT URINALYSIS DIPSTICK
BILIRUBIN UA: NEGATIVE
Glucose, UA: NEGATIVE
Ketones, UA: NEGATIVE
LEUKOCYTES UA: NEGATIVE
NITRITE UA: NEGATIVE
PH UA: 6
PROTEIN UA: NEGATIVE
RBC UA: NEGATIVE
Spec Grav, UA: 1.01
UROBILINOGEN UA: 0.2

## 2014-10-02 LAB — LIPID PANEL
CHOLESTEROL: 141 mg/dL (ref 0–200)
HDL: 38.4 mg/dL — AB (ref >39.00)
LDL CALC: 76 mg/dL (ref 0–99)
NonHDL: 102.9
TRIGLYCERIDES: 133 mg/dL (ref 0.0–149.0)
Total CHOL/HDL Ratio: 4
VLDL: 26.6 mg/dL (ref 0.0–40.0)

## 2014-10-02 LAB — BASIC METABOLIC PANEL
BUN: 21 mg/dL (ref 6–23)
CHLORIDE: 104 meq/L (ref 96–112)
CO2: 29 mEq/L (ref 19–32)
Calcium: 9.5 mg/dL (ref 8.4–10.5)
Creatinine, Ser: 0.87 mg/dL (ref 0.40–1.50)
GFR: 93.49 mL/min (ref >60.00)
Glucose, Bld: 83 mg/dL (ref 70–99)
POTASSIUM: 5.2 meq/L — AB (ref 3.5–5.1)
SODIUM: 141 meq/L (ref 135–145)

## 2014-10-02 LAB — HEPATIC FUNCTION PANEL
ALT: 19 U/L (ref 0–53)
AST: 20 U/L (ref 0–37)
Albumin: 4.4 g/dL (ref 3.5–5.2)
Alkaline Phosphatase: 86 U/L (ref 39–117)
BILIRUBIN DIRECT: 0.1 mg/dL (ref 0.0–0.3)
BILIRUBIN TOTAL: 0.7 mg/dL (ref 0.2–1.2)
Total Protein: 6.9 g/dL (ref 6.0–8.3)

## 2014-10-02 LAB — TSH: TSH: 1.28 u[IU]/mL (ref 0.35–4.50)

## 2014-10-02 LAB — PSA: PSA: 0.28 ng/mL (ref 0.10–4.00)

## 2014-10-08 ENCOUNTER — Ambulatory Visit (INDEPENDENT_AMBULATORY_CARE_PROVIDER_SITE_OTHER): Payer: 59 | Admitting: Family Medicine

## 2014-10-08 ENCOUNTER — Encounter: Payer: Self-pay | Admitting: Family Medicine

## 2014-10-08 ENCOUNTER — Encounter: Payer: 59 | Admitting: Family Medicine

## 2014-10-08 VITALS — BP 133/72 | HR 66 | Temp 98.5°F | Ht 69.5 in | Wt 190.0 lb

## 2014-10-08 DIAGNOSIS — Z23 Encounter for immunization: Secondary | ICD-10-CM

## 2014-10-08 DIAGNOSIS — Z0000 Encounter for general adult medical examination without abnormal findings: Secondary | ICD-10-CM | POA: Diagnosis not present

## 2014-10-08 NOTE — Progress Notes (Signed)
   Subjective:    Patient ID: Jeremy [name], male    DOB: 07-28-1950, 64 y.o.   MRN: [id number]  HPI 64 yr old male for a cpx. He feels well. His BP is stable. He had a good visit with Dr. Graciela Husbands in June. He has had no further syncopal spells or any other issues.    Review of Systems  Constitutional: Negative.   HENT: Negative.   Eyes: Negative.   Respiratory: Negative.   Cardiovascular: Negative.   Gastrointestinal: Negative.   Genitourinary: Negative.   Musculoskeletal: Negative.   Skin: Negative.   Neurological: Negative.   Psychiatric/Behavioral: Negative.        Objective:   Physical Exam  Constitutional: He is oriented to person, place, and time. He appears well-developed and well-nourished. No distress.  HENT:  Head: Normocephalic and atraumatic.  Right Ear: External ear normal.  Left Ear: External ear normal.  Nose: Nose normal.  Mouth/Throat: Oropharynx is clear and moist. No oropharyngeal exudate.  Eyes: Conjunctivae and EOM are normal. Pupils are equal, round, and reactive to light. Right eye exhibits no discharge. Left eye exhibits no discharge. No scleral icterus.  Neck: Neck supple. No JVD present. No tracheal deviation present. No thyromegaly present.  Cardiovascular: Normal rate, regular rhythm, normal heart sounds and intact distal pulses.  Exam reveals no gallop and no friction rub.   No murmur heard. Pulmonary/Chest: Effort normal and breath sounds normal. No respiratory distress. He has no wheezes. He has no rales. He exhibits no tenderness.  Abdominal: Soft. Bowel sounds are normal. He exhibits no distension and no mass. There is no tenderness. There is no rebound and no guarding.  Genitourinary: Rectum normal, prostate normal and penis normal. Guaiac negative stool. No penile tenderness.  Musculoskeletal: Normal range of motion. He exhibits no edema or tenderness.  Lymphadenopathy:    He has no cervical adenopathy.  Neurological: He is alert and oriented  to person, place, and time. He has normal reflexes. No cranial nerve deficit. He exhibits normal muscle tone. Coordination normal.  Skin: Skin is warm and dry. No rash noted. He is not diaphoretic. No erythema. No pallor.  Psychiatric: He has a normal mood and affect. His behavior is normal. Judgment and thought content normal.          Assessment & Plan:  Well exam. We discussed diet and exercise advice. Follow up in 6 months

## 2014-10-08 NOTE — Progress Notes (Signed)
Pre visit review using our clinic review tool, if applicable. No additional management support is needed unless otherwise documented below in the visit note. 

## 2014-12-04 DIAGNOSIS — Z7689 Persons encountering health services in other specified circumstances: Secondary | ICD-10-CM

## 2015-07-21 ENCOUNTER — Ambulatory Visit (INDEPENDENT_AMBULATORY_CARE_PROVIDER_SITE_OTHER): Payer: Medicare Other | Admitting: Internal Medicine

## 2015-07-21 ENCOUNTER — Encounter: Payer: Self-pay | Admitting: Internal Medicine

## 2015-07-21 VITALS — BP 134/66 | HR 59 | Ht 70.0 in | Wt 192.6 lb

## 2015-07-21 DIAGNOSIS — I2510 Atherosclerotic heart disease of native coronary artery without angina pectoris: Secondary | ICD-10-CM | POA: Diagnosis not present

## 2015-07-21 DIAGNOSIS — R55 Syncope and collapse: Secondary | ICD-10-CM

## 2015-07-21 LAB — BASIC METABOLIC PANEL
BUN: 17 mg/dL (ref 7–25)
CALCIUM: 9.1 mg/dL (ref 8.6–10.3)
CO2: 27 mmol/L (ref 20–31)
Chloride: 103 mmol/L (ref 98–110)
Creat: 0.96 mg/dL (ref 0.70–1.25)
Glucose, Bld: 109 mg/dL — ABNORMAL HIGH (ref 65–99)
Potassium: 4.2 mmol/L (ref 3.5–5.3)
Sodium: 139 mmol/L (ref 135–146)

## 2015-07-21 NOTE — Patient Instructions (Signed)
Medication Instructions: - Your physician recommends that you continue on your current medications as directed. Please refer to the Current Medication list given to you today.  Labwork: - Your physician recommends that you have lab work today:BMP  Procedures/Testing: - none  Follow-Up: - Your physician wants you to follow-up in: 1 year with Dr. Graciela HusbandsKlein. You will receive a reminder letter in the mail two months in advance. If you don't receive a letter, please call our office to schedule the follow-up appointment.  Any Additional Special Instructions Will Be Listed Below (If Applicable).     If you need a refill on your cardiac medications before your next appointment, please call your pharmacy.

## 2015-07-21 NOTE — Progress Notes (Signed)
      Patient Care Team: Nelwyn SalisburyStephen A Fry, MD as PCP - General (Family Medicine)   HPI  Jeremy [name] is a 65 y.o. male Seen in followup for syncope. He has had 2 episodes one [place]  He also is a history of coronary artery disease in his distal RCA was treated with an Taxus drug-eluting stent at the site after his first syncopal episode.Marland Kitchen. He also had stenting of his proximal RCA and of his LAD. Because this was found unsuspectedly, he was admitted for repeat catheterization following a syncopal episode which demonstrated no significant progression of his coronary disease.   The patient denies chest pain, shortness of breath, nocturnal dyspnea, orthopnea or peripheral edema. There have been no palpitations, lightheadedness or syncope.   I reviewed his catheterization data from 2007 with Dr. Erlinda [facility].   Past Medical History  Diagnosis Date  . Coronary artery disease     Cardiac catheterization 8/07: Proximal LAD 50-75%, mid LAD 60%, ostial septal perforator 80%, proximal RCA 50% and then 80-85%, distal RCA 80-90%, EF 55-60%.  PCI was performed at that time colon distal RCA treated with a Taxus drug-eluting stent.  The proximal RCA treated with a Taxus drug eluting stent.  The LAD was studied with IVUS and treated with a Taxus drug eluding stent.    . Syncope   . Hyperlipidemia   . Ill-defined closed fractures of upper limb   . Sinus bradycardia     sees Dr. Graciela HusbandsKlein   . Colon polyp 07/05/2007    TUBULAR ADENOMA    Past Surgical History  Procedure Laterality Date  . Appendectomy    . Coronary angioplasty    . Colonoscopy  06-20-13    per Dr. Leone PayorGessner, clear, repeat in 10 yrs     Current Outpatient Prescriptions  Medication Sig Dispense Refill  . aspirin 81 MG tablet Take 81 mg by mouth daily.      . clopidogrel (PLAVIX) 75 MG tablet Take 75 mg by mouth daily.    . fluticasone (FLONASE) 50 MCG/ACT nasal spray Place 2 sprays into the nose as directed. 48 g  3  . Ibuprofen (ADVIL) 200 MG CAPS Take 1 capsule by mouth as needed (for pain).     . rosuvastatin (CRESTOR) 20 MG tablet Take 20 mg by mouth daily.     No current facility-administered medications for this visit.    No Known Allergies  Review of Systems negative except from HPI and PMH  Physical Exam BP 134/66 mmHg  Pulse 59  Ht 5\' 10"  (1.778 m)  Wt 192 lb 9.6 oz (87.363 kg)  BMI 27.64 kg/m2  SpO2 97% Well developed and nourished in no acute distress HENT normal Neck supple with JVP-flat Clear Regular rate and rhythm, no murmurs or gallops Abd-soft with active BS No Clubbing cyanosis edema Skin-warm and dry A & Oriented  Grossly normal sensory and motor function  ECG demonstrates sinus rhythm 59 Intervals 16/10/41  Otherwise normal   Assessment and  Plan  Syncope  Coronary artery disease status post Taxus stenting RCA/LAD 2007  Hyperlipidemia last checked 9/16  Hyperkalemia  No intercurrent syncope.  Without symptoms of ischemia  I have reviewed with Dr. Erlinda [facility] antiplatelet therapies; it was his recommendation with Taxus stenting a dual antiplatelet therapy be continued. LDL is near target 77. Continue current medications  We  Will recheck K as levels were up twice

## 2015-07-28 ENCOUNTER — Telehealth: Payer: Self-pay | Admitting: Internal Medicine

## 2015-07-28 NOTE — Telephone Encounter (Signed)
Follow-up     The pt is returning the nurse call no other information provided

## 2015-07-28 NOTE — Telephone Encounter (Signed)
Attempted to contact the patient at the # provided- message stated "due to network difficulties call not be completed."  Will attempt to call the patient back at a later time.

## 2015-07-31 NOTE — Telephone Encounter (Signed)
I spoke with the patient's wife (DPR) she is aware of his lab results.

## 2015-08-14 ENCOUNTER — Other Ambulatory Visit: Payer: Self-pay | Admitting: Family Medicine

## 2015-08-14 NOTE — Telephone Encounter (Signed)
Refill sent to pharmacy.   

## 2015-10-22 ENCOUNTER — Other Ambulatory Visit: Payer: Medicare Other

## 2015-10-23 ENCOUNTER — Other Ambulatory Visit (INDEPENDENT_AMBULATORY_CARE_PROVIDER_SITE_OTHER): Payer: Medicare Other

## 2015-10-23 DIAGNOSIS — Z0000 Encounter for general adult medical examination without abnormal findings: Secondary | ICD-10-CM | POA: Diagnosis not present

## 2015-10-23 LAB — HEPATIC FUNCTION PANEL
ALBUMIN: 4.2 g/dL (ref 3.5–5.2)
ALK PHOS: 78 U/L (ref 39–117)
ALT: 18 U/L (ref 0–53)
AST: 18 U/L (ref 0–37)
Bilirubin, Direct: 0.2 mg/dL (ref 0.0–0.3)
TOTAL PROTEIN: 7 g/dL (ref 6.0–8.3)
Total Bilirubin: 0.9 mg/dL (ref 0.2–1.2)

## 2015-10-23 LAB — CBC WITH DIFFERENTIAL/PLATELET
BASOS PCT: 0.6 % (ref 0.0–3.0)
Basophils Absolute: 0 10*3/uL (ref 0.0–0.1)
EOS PCT: 3.8 % (ref 0.0–5.0)
Eosinophils Absolute: 0.3 10*3/uL (ref 0.0–0.7)
HEMATOCRIT: 42.8 % (ref 39.0–52.0)
Hemoglobin: 14.6 g/dL (ref 13.0–17.0)
LYMPHS ABS: 1.6 10*3/uL (ref 0.7–4.0)
LYMPHS PCT: 23.7 % (ref 12.0–46.0)
MCHC: 34.1 g/dL (ref 30.0–36.0)
MCV: 89.3 fl (ref 78.0–100.0)
MONOS PCT: 8.9 % (ref 3.0–12.0)
Monocytes Absolute: 0.6 10*3/uL (ref 0.1–1.0)
NEUTROS ABS: 4.4 10*3/uL (ref 1.4–7.7)
NEUTROS PCT: 63 % (ref 43.0–77.0)
PLATELETS: 251 10*3/uL (ref 150.0–400.0)
RBC: 4.8 Mil/uL (ref 4.22–5.81)
RDW: 13.8 % (ref 11.5–15.5)
WBC: 6.9 10*3/uL (ref 4.0–10.5)

## 2015-10-23 LAB — POC URINALSYSI DIPSTICK (AUTOMATED)
Bilirubin, UA: NEGATIVE
Blood, UA: NEGATIVE
Glucose, UA: NEGATIVE
Ketones, UA: NEGATIVE
Leukocytes, UA: NEGATIVE
NITRITE UA: NEGATIVE
PROTEIN UA: NEGATIVE
SPEC GRAV UA: 1.02
UROBILINOGEN UA: 1
pH, UA: 6.5

## 2015-10-23 LAB — BASIC METABOLIC PANEL
BUN: 19 mg/dL (ref 6–23)
CALCIUM: 9.4 mg/dL (ref 8.4–10.5)
CHLORIDE: 102 meq/L (ref 96–112)
CO2: 31 meq/L (ref 19–32)
Creatinine, Ser: 0.93 mg/dL (ref 0.40–1.50)
GFR: 86.28 mL/min (ref >60.00)
GLUCOSE: 82 mg/dL (ref 70–99)
Potassium: 5 mEq/L (ref 3.5–5.1)
SODIUM: 139 meq/L (ref 135–145)

## 2015-10-23 LAB — LIPID PANEL
CHOLESTEROL: 130 mg/dL (ref 0–200)
HDL: 43 mg/dL (ref >39.00)
LDL Cholesterol: 71 mg/dL (ref 0–99)
NONHDL: 86.51
TRIGLYCERIDES: 79 mg/dL (ref 0.0–149.0)
Total CHOL/HDL Ratio: 3
VLDL: 15.8 mg/dL (ref 0.0–40.0)

## 2015-10-23 LAB — PSA: PSA: 0.27 ng/mL (ref 0.10–4.00)

## 2015-10-23 LAB — TSH: TSH: 1.28 u[IU]/mL (ref 0.35–4.50)

## 2015-10-29 ENCOUNTER — Encounter: Payer: Self-pay | Admitting: Family Medicine

## 2015-10-29 ENCOUNTER — Ambulatory Visit (INDEPENDENT_AMBULATORY_CARE_PROVIDER_SITE_OTHER): Payer: Medicare Other | Admitting: Family Medicine

## 2015-10-29 VITALS — BP 112/67 | HR 59 | Temp 98.5°F | Ht 70.0 in | Wt 186.0 lb

## 2015-10-29 DIAGNOSIS — Z23 Encounter for immunization: Secondary | ICD-10-CM | POA: Diagnosis not present

## 2015-10-29 DIAGNOSIS — Z0000 Encounter for general adult medical examination without abnormal findings: Secondary | ICD-10-CM

## 2015-10-29 MED ORDER — DESOXIMETASONE 0.25 % EX CREA: 1.0000 "application " | TOPICAL_CREAM | Freq: Two times a day (BID) | CUTANEOUS | 2 refills | Status: DC

## 2015-10-29 NOTE — Progress Notes (Signed)
   Subjective:    Patient ID: Jeremy [name], male    DOB: 1950-10-21, 65 y.o.   MRN: [id number]  HPI 65 yr old male for a well exam. He feels fine except for some mild pain in the right shoulder that acts up if he does a lot of physical work. Ibuprofen helps.    Review of Systems  Constitutional: Negative.   HENT: Negative.   Eyes: Negative.   Respiratory: Negative.   Cardiovascular: Negative.   Gastrointestinal: Negative.   Genitourinary: Negative.   Musculoskeletal: Negative.   Skin: Negative.   Neurological: Negative.   Psychiatric/Behavioral: Negative.        Objective:   Physical Exam  Constitutional: He is oriented to person, place, and time. He appears well-developed and well-nourished. No distress.  HENT:  Head: Normocephalic and atraumatic.  Right Ear: External ear normal.  Left Ear: External ear normal.  Nose: Nose normal.  Mouth/Throat: Oropharynx is clear and moist. No oropharyngeal exudate.  Eyes: Conjunctivae and EOM are normal. Pupils are equal, round, and reactive to light. Right eye exhibits no discharge. Left eye exhibits no discharge. No scleral icterus.  Neck: Neck supple. No JVD present. No tracheal deviation present. No thyromegaly present.  Cardiovascular: Normal rate, regular rhythm, normal heart sounds and intact distal pulses.  Exam reveals no gallop and no friction rub.   No murmur heard. Pulmonary/Chest: Effort normal and breath sounds normal. No respiratory distress. He has no wheezes. He has no rales. He exhibits no tenderness.  Abdominal: Soft. Bowel sounds are normal. He exhibits no distension and no mass. There is no tenderness. There is no rebound and no guarding.  Genitourinary: Rectum normal, prostate normal and penis normal. Rectal exam shows guaiac negative stool. No penile tenderness.  Musculoskeletal: Normal range of motion. He exhibits no edema or tenderness.  The right shoulder has some mild crepitus but is not tender and has full ROM     Lymphadenopathy:    He has no cervical adenopathy.  Neurological: He is alert and oriented to person, place, and time. He has normal reflexes. No cranial nerve deficit. He exhibits normal muscle tone. Coordination normal.  Skin: Skin is warm and dry. No rash noted. He is not diaphoretic. No erythema. No pallor.  Psychiatric: He has a normal mood and affect. His behavior is normal. Judgment and thought content normal.          Assessment & Plan:  Well exam. We discussed diet and exercise. He seems to have some mild arthritis in the shoulder. He will monitor this for now and recheck if needed.  Nelwyn SalisburyFRY,Bonnell Placzek A, MD

## 2015-10-29 NOTE — Progress Notes (Signed)
Pre visit review using our clinic review tool, if applicable. No additional management support is needed unless otherwise documented below in the visit note. 

## 2015-11-03 ENCOUNTER — Encounter: Payer: Self-pay | Admitting: Family Medicine

## 2015-11-05 NOTE — Telephone Encounter (Signed)
Tell him I really think it would be safe to take Melatonin since it is a chemical we all have in our system. Just don't take more than 5 mg a day

## 2015-11-12 ENCOUNTER — Other Ambulatory Visit: Payer: Self-pay | Admitting: Family Medicine

## 2016-02-11 ENCOUNTER — Other Ambulatory Visit: Payer: Self-pay | Admitting: Family Medicine

## 2016-02-11 NOTE — Telephone Encounter (Signed)
Pt would like a refill. Please advise  

## 2016-05-11 ENCOUNTER — Other Ambulatory Visit: Payer: Self-pay | Admitting: Family Medicine

## 2016-05-19 ENCOUNTER — Encounter: Payer: Self-pay | Admitting: Internal Medicine

## 2016-05-24 ENCOUNTER — Encounter: Payer: Self-pay | Admitting: Internal Medicine

## 2016-06-14 ENCOUNTER — Encounter: Payer: Self-pay | Admitting: Internal Medicine

## 2016-06-14 ENCOUNTER — Ambulatory Visit (INDEPENDENT_AMBULATORY_CARE_PROVIDER_SITE_OTHER): Payer: Medicare Other | Admitting: Internal Medicine

## 2016-06-14 VITALS — BP 122/68 | HR 60 | Ht 70.0 in | Wt 188.0 lb

## 2016-06-14 DIAGNOSIS — R55 Syncope and collapse: Secondary | ICD-10-CM | POA: Diagnosis not present

## 2016-06-14 DIAGNOSIS — E875 Hyperkalemia: Secondary | ICD-10-CM | POA: Diagnosis not present

## 2016-06-14 DIAGNOSIS — I2510 Atherosclerotic heart disease of native coronary artery without angina pectoris: Secondary | ICD-10-CM

## 2016-06-14 NOTE — Progress Notes (Signed)
Patient Care Team: Nelwyn SalisburyFry, Stephen A, MD as PCP - General (Family Medicine)   HPI  Jeremy [name] is a 66 y.o. male Seen in followup for syncope. He has had 2 episodes one remotely and [place].  He also is a history of coronary artery disease in his distal RCA was treated with an Taxus drug-eluting stent at the site after his first syncopal episode.Marland Kitchen. He also had stenting of his proximal RCA and of his LAD. Because this was found unsuspectedly, he was admitted for repeat catheterization following a syncopal episode which demonstrated no significant progression of his coronary disease.   The patient denies chest pain, shortness of breath, nocturnal dyspnea, orthopnea or peripheral edema. There have been no palpitations, lightheadedness or syncope. He feels great  I have eviewed his catheterization with [facility]. The plan will be long term DAPT   C/o fatigue in his legs? statiin effect   Past Medical History:  Diagnosis Date  . Colon polyp 07/05/2007   TUBULAR ADENOMA  . Coronary artery disease    Cardiac catheterization 8/07: Proximal LAD 50-75%, mid LAD 60%, ostial septal perforator 80%, proximal RCA 50% and then 80-85%, distal RCA 80-90%, EF 55-60%.  PCI was performed at that time colon distal RCA treated with a Taxus drug-eluting stent.  The proximal RCA treated with a Taxus drug eluting stent.  The LAD was studied with IVUS and treated with a Taxus drug eluding stent.    . Hyperlipidemia   . Ill-defined closed fractures of upper limb   . Sinus bradycardia    sees Dr. Graciela HusbandsKlein   . Syncope     Past Surgical History:  Procedure Laterality Date  . APPENDECTOMY    . COLONOSCOPY  06-20-13   per Dr. Leone PayorGessner, clear, repeat in 10 yrs   . CORONARY ANGIOPLASTY      Current Outpatient Prescriptions  Medication Sig Dispense Refill  . aspirin 81 MG tablet Take 81 mg by mouth daily.      . clopidogrel (PLAVIX) 75 MG tablet TAKE 1 TABLET DAILY 90 tablet 3  .  fluticasone (FLONASE) 50 MCG/ACT nasal spray Place 2 sprays into the nose as directed. 48 g 3  . Ibuprofen (ADVIL) 200 MG CAPS Take 1 capsule by mouth as needed (for pain).     . rosuvastatin (CRESTOR) 20 MG tablet TAKE 1 TABLET DAILY 90 tablet 2   No current facility-administered medications for this visit.     No Known Allergies  Review of Systems negative except from HPI and PMH  Physical Exam BP 122/68   Pulse 60   Ht 5\' 10"  (1.778 m)   Wt 188 lb (85.3 kg)   SpO2 96%   BMI 26.98 kg/m  Well developed and nourished in no acute distress HENT normal Neck supple with JVP-flat Clear Regular rate and rhythm, no murmurs or gallops Abd-soft with active BS No Clubbing cyanosis edema Skin-warm and dry A & Oriented  Grossly normal sensory and motor function  ECG Personally reviewed demonstrates sinus rhythm 60 Intervals 16/10/41  Otherwise normal   Assessment and  Plan  Syncope  Coronary artery disease status post Taxus stenting RCA/LAD 2007--discussion with [facility] long term DAPT  Hyperlipidemia Borderline Hyperkalemia  Leg cramps  No intercurrent syncope.  Without symptoms of ischemia  We will hold the statin for one month to see whether the leg symptoms abate.  If so we will reintroduce 2 then 3/week   LDL  checked 9/17>>LDL 71 at goal!

## 2016-06-14 NOTE — Patient Instructions (Signed)
Medication Instructions: Your physician has recommended you make the following change in your medication:  -1) Hold CRESTOR for 1 month   Labwork: None Ordered  Procedures/Testing: None Ordered  Follow-Up: Your physician wants you to follow-up in: 1 YEAR with Dr. Graciela HusbandsKlein. You will receive a reminder letter in the mail two months in advance. If you don't receive a letter, please call our office to schedule the follow-up appointment.   Any Additional Special Instructions Will Be Listed Below (If Applicable).     If you need a refill on your cardiac medications before your next appointment, please call your pharmacy.

## 2016-06-16 ENCOUNTER — Ambulatory Visit (INDEPENDENT_AMBULATORY_CARE_PROVIDER_SITE_OTHER): Payer: Medicare Other | Admitting: Family Medicine

## 2016-06-16 ENCOUNTER — Encounter: Payer: Self-pay | Admitting: Family Medicine

## 2016-06-16 VITALS — BP 118/64 | HR 62 | Temp 98.1°F | Ht 70.0 in | Wt 185.8 lb

## 2016-06-16 DIAGNOSIS — L02214 Cutaneous abscess of groin: Secondary | ICD-10-CM

## 2016-06-16 MED ORDER — DOXYCYCLINE HYCLATE 100 MG PO TABS: 100.0000 mg | ORAL_TABLET | Freq: Two times a day (BID) | ORAL | 0 refills | Status: DC

## 2016-06-16 NOTE — Progress Notes (Signed)
Subjective:  Jeremy [name] is a 66 y.o. year old very pleasant male patient who presents for/with See problem oriented charting ROS-no fevers, chills, fatigue/malaise, nausea/vomiting, or recent weight change   Past Medical History-  Patient Active Problem List   Diagnosis Date Noted  . Long term current use of antithrombotics/antiplatelets - clopidogrel - cardiac stents 04/01/2013  . Sinus bradycardia 05/25/2010  . SYNCOPE 04/27/2009  . Personal history of colonic polyps 07/10/2007  . HYPERLIPIDEMIA 04/28/2007  . coronary artery disease-tandem stenting DES-RCA; stent to LAD 2007 04/28/2007    Medications- reviewed and updated Current Outpatient Prescriptions  Medication Sig Dispense Refill  . aspirin 81 MG tablet Take 81 mg by mouth daily.      . clopidogrel (PLAVIX) 75 MG tablet TAKE 1 TABLET DAILY 90 tablet 3  . fluticasone (FLONASE) 50 MCG/ACT nasal spray Place 2 sprays into the nose as directed. 48 g 3  . Ibuprofen (ADVIL) 200 MG CAPS Take 1 capsule by mouth as needed (for pain).     . rosuvastatin (CRESTOR) 20 MG tablet TAKE 1 TABLET DAILY (Patient not taking: Reported on 06/16/2016) 90 tablet 2   Objective: BP 118/64 (BP Location: Left Arm, Patient Position: Sitting, Cuff Size: Large)   Pulse 62   Temp 98.1 F (36.7 C) (Oral)   Ht 5\' 10"  (1.778 m)   Wt 185 lb 12.8 oz (84.3 kg)   SpO2 96%   BMI 26.66 kg/m  Gen: NAD, resting comfortably CV: RRR no murmurs rubs or gallops Lungs: CTAB no crackles, wheeze, rhonchi Abdomen: soft/nontender/nondistended/normal bowel sounds. overweight GU: Left groin there is a 5 x 3 cm mostly firm, indurated area. No obvious areas of fluctuance or clear "head" to lesion. Area is warm to touch and erythematous. Mild pain with palpation. Above chain of lymph nodes with no lymph node enlarged  Assessment/Plan:  Abscess of left groin S: patient concerned about possibl cyst in left groin region. Present 2-3 days. Getting slightly bigger. Has not  been sick lately. No cuts or scrapes on the legs. States area is warm to touch and mildly tender. Has tried a warm compress once so far- which helps slightly with discomfort.  A/P: appears to be an abscess of right groin- no clear fluctuance and given are is in groin and on plavix- we thought low yield at present time to do exploratory incision and drainage. Started him on doxycycline with strict return precautions. He will use warm compresses and we hope this comes to a head and will drain. If not or if area worsens- may need incision and drainage. We wanted to give him at least 24 hours to see if this would stabilize - so we discussed Saturday clinic, weekend urgent care, or our office on Monday.   Meds ordered this encounter  Medications  . doxycycline (VIBRA-TABS) 100 MG tablet    Sig: Take 1 tablet (100 mg total) by mouth 2 (two) times daily.    Dispense:  14 tablet    Refill:  0  new acute condition with medication management and higher risk patient due to being on plavix.   Return precautions advised.  Tana ConchStephen Hunter, MD

## 2016-06-16 NOTE — Patient Instructions (Addendum)
Abscess of left groin  It feels rather deep without any clear area to cut into right now.   Lets use warm compresses at home at least 4-5 x a day and hope this comes to a head. There is no obvious "fluctuant" area- meaning an area that I think has high chance of being pus  Treat with antibiotic doxycycline. If still getting worse by Saturday- call early and get in with our Saturday clinic. See us sooner if fever or significant worsening. Try to avoid prolonged sun exposure on this antibiotic

## 2016-06-21 ENCOUNTER — Telehealth: Payer: Self-pay

## 2016-06-21 NOTE — Telephone Encounter (Signed)
Patient called to report that the abcess has been draining for several days. He has been keeping area clean and dry. He denies any increased redness, f/n/v or pain. Advised pt to continue to keep area clean/dry and to monitor for any increase in symptoms. Pt voiced understanding and will contact office if any changes. Nothing further needed at this time.

## 2016-10-20 ENCOUNTER — Encounter: Payer: Self-pay | Admitting: Family Medicine

## 2016-11-14 ENCOUNTER — Ambulatory Visit (INDEPENDENT_AMBULATORY_CARE_PROVIDER_SITE_OTHER): Payer: Medicare Other

## 2016-11-14 DIAGNOSIS — Z23 Encounter for immunization: Secondary | ICD-10-CM | POA: Diagnosis not present

## 2016-12-01 ENCOUNTER — Ambulatory Visit (INDEPENDENT_AMBULATORY_CARE_PROVIDER_SITE_OTHER): Payer: Medicare Other | Admitting: Family Medicine

## 2016-12-01 ENCOUNTER — Encounter: Payer: Self-pay | Admitting: Family Medicine

## 2016-12-01 VITALS — BP 126/70 | HR 60 | Temp 98.3°F | Ht 69.5 in | Wt 186.6 lb

## 2016-12-01 DIAGNOSIS — N138 Other obstructive and reflux uropathy: Secondary | ICD-10-CM

## 2016-12-01 DIAGNOSIS — I2510 Atherosclerotic heart disease of native coronary artery without angina pectoris: Secondary | ICD-10-CM

## 2016-12-01 DIAGNOSIS — E785 Hyperlipidemia, unspecified: Secondary | ICD-10-CM

## 2016-12-01 DIAGNOSIS — R001 Bradycardia, unspecified: Secondary | ICD-10-CM | POA: Diagnosis not present

## 2016-12-01 DIAGNOSIS — R55 Syncope and collapse: Secondary | ICD-10-CM | POA: Diagnosis not present

## 2016-12-01 DIAGNOSIS — N401 Enlarged prostate with lower urinary tract symptoms: Secondary | ICD-10-CM | POA: Diagnosis not present

## 2016-12-01 NOTE — Progress Notes (Signed)
   Subjective:    Patient ID: Jeremy [name], male    DOB: [birth year], 66 y.o.   MRN: [id number]  HPI Here to follow up on issues. He feels well. He had a few syncopal spells earlier in the year, but he has had none of these for months now. He walks on the treadmill several days a week.    Review of Systems  Constitutional: Negative.   HENT: Negative.   Eyes: Negative.   Respiratory: Negative.   Cardiovascular: Negative.   Gastrointestinal: Negative.   Genitourinary: Negative.   Musculoskeletal: Negative.   Skin: Negative.   Neurological: Negative.   Psychiatric/Behavioral: Negative.        Objective:   Physical Exam  Constitutional: He is oriented to person, place, and time. He appears well-developed and well-nourished. No distress.  HENT:  Head: Normocephalic and atraumatic.  Right Ear: External ear normal.  Left Ear: External ear normal.  Nose: Nose normal.  Mouth/Throat: Oropharynx is clear and moist. No oropharyngeal exudate.  Eyes: Pupils are equal, round, and reactive to light. Conjunctivae and EOM are normal. Right eye exhibits no discharge. Left eye exhibits no discharge. No scleral icterus.  Neck: Neck supple. No JVD present. No tracheal deviation present. No thyromegaly present.  Cardiovascular: Normal rate, regular rhythm, normal heart sounds and intact distal pulses.  Exam reveals no gallop and no friction rub.   No murmur heard. Pulmonary/Chest: Effort normal and breath sounds normal. No respiratory distress. He has no wheezes. He has no rales. He exhibits no tenderness.  Abdominal: Soft. Bowel sounds are normal. He exhibits no distension and no mass. There is no tenderness. There is no rebound and no guarding.  Genitourinary: Rectum normal, prostate normal and penis normal. Rectal exam shows guaiac negative stool. No penile tenderness.  Musculoskeletal: Normal range of motion. He exhibits no edema or tenderness.  Lymphadenopathy:    He has no cervical adenopathy.   Neurological: He is alert and oriented to person, place, and time. He has normal reflexes. No cranial nerve deficit. He exhibits normal muscle tone. Coordination normal.  Skin: Skin is warm and dry. No rash noted. He is not diaphoretic. No erythema. No pallor.  Psychiatric: He has a normal mood and affect. His behavior is normal. Judgment and thought content normal.          Assessment & Plan:  He seems to be doing well. His CAD is stable. He remains on aspirin and Plavix since he has stents in place. His BP is stable. He will return soon for fasting labs to check his lipids, etc.  Gershon CraneStephen Fry, MD

## 2016-12-02 ENCOUNTER — Other Ambulatory Visit (INDEPENDENT_AMBULATORY_CARE_PROVIDER_SITE_OTHER): Payer: Medicare Other

## 2016-12-02 DIAGNOSIS — E785 Hyperlipidemia, unspecified: Secondary | ICD-10-CM | POA: Diagnosis not present

## 2016-12-02 DIAGNOSIS — N401 Enlarged prostate with lower urinary tract symptoms: Secondary | ICD-10-CM | POA: Diagnosis not present

## 2016-12-02 DIAGNOSIS — N138 Other obstructive and reflux uropathy: Secondary | ICD-10-CM | POA: Diagnosis not present

## 2016-12-02 LAB — BASIC METABOLIC PANEL
BUN: 20 mg/dL (ref 6–23)
CALCIUM: 9.6 mg/dL (ref 8.4–10.5)
CO2: 31 mEq/L (ref 19–32)
CREATININE: 0.91 mg/dL (ref 0.40–1.50)
Chloride: 102 mEq/L (ref 96–112)
GFR: 88.18 mL/min (ref >60.00)
GLUCOSE: 88 mg/dL (ref 70–99)
Potassium: 4.9 mEq/L (ref 3.5–5.1)
SODIUM: 138 meq/L (ref 135–145)

## 2016-12-02 LAB — CBC WITH DIFFERENTIAL/PLATELET
BASOS ABS: 0 10*3/uL (ref 0.0–0.1)
BASOS PCT: 0.5 % (ref 0.0–3.0)
Eosinophils Absolute: 0.3 10*3/uL (ref 0.0–0.7)
Eosinophils Relative: 5 % (ref 0.0–5.0)
HEMATOCRIT: 45.3 % (ref 39.0–52.0)
Hemoglobin: 15.1 g/dL (ref 13.0–17.0)
LYMPHS ABS: 1.6 10*3/uL (ref 0.7–4.0)
LYMPHS PCT: 25 % (ref 12.0–46.0)
MCHC: 33.4 g/dL (ref 30.0–36.0)
MCV: 92.9 fl (ref 78.0–100.0)
MONOS PCT: 10.7 % (ref 3.0–12.0)
Monocytes Absolute: 0.7 10*3/uL (ref 0.1–1.0)
NEUTROS ABS: 3.8 10*3/uL (ref 1.4–7.7)
NEUTROS PCT: 58.8 % (ref 43.0–77.0)
PLATELETS: 245 10*3/uL (ref 150.0–400.0)
RBC: 4.88 Mil/uL (ref 4.22–5.81)
RDW: 13.8 % (ref 11.5–15.5)
WBC: 6.5 10*3/uL (ref 4.0–10.5)

## 2016-12-02 LAB — POC URINALSYSI DIPSTICK (AUTOMATED)
BILIRUBIN UA: NEGATIVE
GLUCOSE UA: NEGATIVE
KETONES UA: NEGATIVE
LEUKOCYTES UA: NEGATIVE
NITRITE UA: NEGATIVE
PH UA: 6 (ref 5.0–8.0)
Protein, UA: NEGATIVE
RBC UA: NEGATIVE
Spec Grav, UA: 1.025 (ref 1.010–1.025)
Urobilinogen, UA: 0.2 E.U./dL

## 2016-12-02 LAB — LIPID PANEL
Cholesterol: 135 mg/dL (ref 0–200)
HDL: 42.8 mg/dL (ref >39.00)
LDL CALC: 72 mg/dL (ref 0–99)
NONHDL: 92.54
TRIGLYCERIDES: 104 mg/dL (ref 0.0–149.0)
Total CHOL/HDL Ratio: 3
VLDL: 20.8 mg/dL (ref 0.0–40.0)

## 2016-12-02 LAB — HEPATIC FUNCTION PANEL
ALT: 23 U/L (ref 0–53)
AST: 21 U/L (ref 0–37)
Albumin: 4.3 g/dL (ref 3.5–5.2)
Alkaline Phosphatase: 69 U/L (ref 39–117)
BILIRUBIN DIRECT: 0.2 mg/dL (ref 0.0–0.3)
BILIRUBIN TOTAL: 0.8 mg/dL (ref 0.2–1.2)
TOTAL PROTEIN: 7 g/dL (ref 6.0–8.3)

## 2016-12-02 LAB — TSH: TSH: 1.25 u[IU]/mL (ref 0.35–4.50)

## 2016-12-02 LAB — PSA: PSA: 0.44 ng/mL (ref 0.10–4.00)

## 2017-01-25 ENCOUNTER — Encounter: Payer: Self-pay | Admitting: Family Medicine

## 2017-02-06 ENCOUNTER — Other Ambulatory Visit: Payer: Self-pay | Admitting: Family Medicine

## 2017-06-08 ENCOUNTER — Ambulatory Visit (INDEPENDENT_AMBULATORY_CARE_PROVIDER_SITE_OTHER): Payer: Medicare Other | Admitting: Internal Medicine

## 2017-06-08 ENCOUNTER — Encounter: Payer: Self-pay | Admitting: Internal Medicine

## 2017-06-08 VITALS — BP 130/70 | HR 60 | Ht 70.0 in | Wt 188.0 lb

## 2017-06-08 DIAGNOSIS — I2510 Atherosclerotic heart disease of native coronary artery without angina pectoris: Secondary | ICD-10-CM | POA: Diagnosis not present

## 2017-06-08 NOTE — Progress Notes (Signed)
      Patient Care Team: Nelwyn Salisbury, MD as PCP - General (Family Medicine)   HPI  Jeremy [name] is a 67 y.o. male Seen in followup for syncope. He has had 2 episodes one remotely and one more recently in [place].  He also is a history of coronary artery disease in his distal RCA was treated with an Taxus drug-eluting stent at the site after his first syncopal episode.Marland Kitchen He also had stenting of his proximal RCA and of his LAD. Because this was found unsuspectedly, he was admitted for repeat catheterization following a syncopal episode which demonstrated no significant progression of his coronary disease.     I have eviewed his catheterization with Dr. Erlinda Hong. The plan will be long term DAPT   The patient denies chest pain, shortness of breath, nocturnal dyspnea, orthopnea or peripheral edema.  There have been no palpitations, lightheadedness or syncope.   Date Cr K  11/18 0.91 4.9          Past Medical History:  Diagnosis Date  . Colon polyp 07/05/2007   TUBULAR ADENOMA  . Coronary artery disease    Cardiac catheterization 8/07: Proximal LAD 50-75%, mid LAD 60%, ostial septal perforator 80%, proximal RCA 50% and then 80-85%, distal RCA 80-90%, EF 55-60%.  PCI was performed at that time colon distal RCA treated with a Taxus drug-eluting stent.  The proximal RCA treated with a Taxus drug eluting stent.  The LAD was studied with IVUS and treated with a Taxus drug eluding stent.    . Hyperlipidemia   . Ill-defined closed fractures of upper limb   . Sinus bradycardia    sees Dr. Graciela Husbands   . Syncope     Past Surgical History:  Procedure Laterality Date  . APPENDECTOMY    . COLONOSCOPY  06-20-13   per Dr. Leone Payor, clear, repeat in 10 yrs   . CORONARY ANGIOPLASTY      Current Outpatient Medications  Medication Sig Dispense Refill  . aspirin 81 MG tablet Take 81 mg by mouth daily.      . clopidogrel (PLAVIX) 75 MG tablet TAKE 1 TABLET DAILY 90 tablet 3  . fluticasone (FLONASE) 50  MCG/ACT nasal spray Place 2 sprays into the nose as directed. 48 g 3  . Ibuprofen (ADVIL) 200 MG CAPS Take 1 capsule by mouth as needed (for pain).     . rosuvastatin (CRESTOR) 20 MG tablet TAKE 1 TABLET DAILY 90 tablet 2   No current facility-administered medications for this visit.     No Known Allergies  Review of Systems negative except from HPI and PMH  Physical Exam BP 130/70   Pulse 60   Ht  (1.778 m)   Wt 188 lb (85.3 kg)   SpO2 98%   BMI 26.98 kg/m  Well developed and nourished in no acute distress HENT normal Neck supple with JVP-flat Clear Regular rate and rhythm, no murmurs or gallops Abd-soft with active BS No Clubbing cyanosis edema Skin-warm and dry A & Oriented  Grossly normal sensory and motor function  ECG Personally  sinusl   Assessment and  Plan  Syncope  Coronary artery disease status post Taxus stenting RCA/LAD 2007--discussion with [facility] long term DAPT  Hyperlipidemia    LDL remains near goal  No syncope  Without symptoms of ischemia

## 2017-06-08 NOTE — Patient Instructions (Signed)

## 2017-06-22 ENCOUNTER — Other Ambulatory Visit: Payer: Self-pay

## 2017-11-05 ENCOUNTER — Other Ambulatory Visit: Payer: Self-pay | Admitting: Family Medicine

## 2017-11-23 ENCOUNTER — Ambulatory Visit (INDEPENDENT_AMBULATORY_CARE_PROVIDER_SITE_OTHER): Payer: Medicare Other | Admitting: *Deleted

## 2017-11-23 DIAGNOSIS — Z23 Encounter for immunization: Secondary | ICD-10-CM | POA: Diagnosis not present

## 2018-01-06 ENCOUNTER — Encounter: Payer: Self-pay | Admitting: Family Medicine

## 2018-03-01 ENCOUNTER — Telehealth: Payer: Self-pay

## 2018-03-01 NOTE — Telephone Encounter (Addendum)
Author phoned pt. to offer to set up initial AWV. Spoke with wife, Carney Bern, who stated that she would let her husband know he is due for CPE with Dr. Clent Ridges and AWV with Thereasa Parkin if interested. Author explained nature of AWV and Carney Bern stated she would relay.

## 2018-03-07 ENCOUNTER — Telehealth: Payer: Self-pay

## 2018-03-07 NOTE — Telephone Encounter (Signed)
Author phoned pt. to see if he could reschedule awv for 1230PM instead of 230PM so that a full hour could be allotted. No answer; author left detailed VM on mobile asking for return call.

## 2018-03-08 NOTE — Telephone Encounter (Signed)
Pt. Returned call, and appointment rescheduled for 1230 on 2/11.

## 2018-03-12 NOTE — Progress Notes (Deleted)
Subjective:   Jeremy [name] is a 68 y.o. male who presents for an Initial Medicare Annual Wellness Visit.  Review of Systems  No ROS.  Medicare Wellness Visit. Additional risk factors are reflected in the social history.    Sleep patterns: {SX; SLEEP PATTERNS:18802::"feels rested on waking","does not get up to void","gets up *** times nightly to void","sleeps *** hours nightly"}.    Home Safety/Smoke Alarms: Feels safe in home. Smoke alarms in place.  Living environment; residence and Firearm Safety: {Rehab home environment / accessibility:30080::"no firearms","firearms stored safely"}. Seat Belt Safety/Bike Helmet: Wears seat belt.    Male:   CCS- 05/2013, due 06/2023     PSA-  Lab Results  Component Value Date   PSA 0.44 12/02/2016   PSA 0.27 10/23/2015   PSA 0.28 10/02/2014      Objective:    There were no vitals filed for this visit. There is no height or weight on file to calculate BMI.  No flowsheet data found.  Current Medications (verified) Outpatient Encounter Medications as of 03/13/2018  Medication Sig  . aspirin 81 MG tablet Take 81 mg by mouth daily.    . clopidogrel (PLAVIX) 75 MG tablet TAKE 1 TABLET DAILY  . fluticasone (FLONASE) 50 MCG/ACT nasal spray Place 2 sprays into the nose as directed.  . Ibuprofen (ADVIL) 200 MG CAPS Take 1 capsule by mouth as needed (for pain).   . rosuvastatin (CRESTOR) 20 MG tablet TAKE 1 TABLET DAILY   No facility-administered encounter medications on file as of 03/13/2018.     Allergies (verified) Patient has no known allergies.   History: Past Medical History:  Diagnosis Date  . Colon polyp 07/05/2007   TUBULAR ADENOMA  . Coronary artery disease    Cardiac catheterization 8/07: Proximal LAD 50-75%, mid LAD 60%, ostial septal perforator 80%, proximal RCA 50% and then 80-85%, distal RCA 80-90%, EF 55-60%.  PCI was performed at that time colon distal RCA treated with a Taxus drug-eluting stent.  The proximal RCA treated  with a Taxus drug eluting stent.  The LAD was studied with IVUS and treated with a Taxus drug eluding stent.    . Hyperlipidemia   . Ill-defined closed fractures of upper limb   . Sinus bradycardia    sees Dr. Graciela Husbands   . Syncope    Past Surgical History:  Procedure Laterality Date  . APPENDECTOMY    . COLONOSCOPY  06-20-13   per Dr. Leone Payor, clear, repeat in 10 yrs   . CORONARY ANGIOPLASTY     Family History  Problem Relation Age of Onset  . Prostate cancer Father   . Cancer Father        prostate cancer w/ mets  . Heart disease Father        CAD  . Hyperlipidemia Father   . Stroke Mother   . Colon cancer Maternal Uncle 60  . Heart attack Paternal Grandmother   . COPD Neg Hx   . Diabetes Neg Hx    Social History   Socioeconomic History  . Marital status: Married    Spouse name: Not on file  . Number of children: 0  . Years of education: 44  . Highest education level: Not on file  Occupational History  . Occupation: Therapist, sports: [employer]  Social Needs  . Financial resource strain: Not on file  . Food insecurity:    Worry: Not on file    Inability: Not on file  .  Transportation needs:    Medical: Not on file    Non-medical: Not on file  Tobacco Use  . Smoking status: Former Games developermoker  . Smokeless tobacco: Never Used  Substance and Sexual Activity  . Alcohol use: Yes    Alcohol/week: 0.0 standard drinks    Comment: occ  . Drug use: No  . Sexual activity: Yes    Partners: Female  Lifestyle  . Physical activity:    Days per week: Not on file    Minutes per session: Not on file  . Stress: Not on file  Relationships  . Social connections:    Talks on phone: Not on file    Gets together: Not on file    Attends religious service: Not on file    Active member of club or organization: Not on file    Attends meetings of clubs or organizations: Not on file    Relationship status: Not on file  Other Topics Concern  . Not on file  Social History  Narrative   [employer], [employer] [place]. Married ''89. No children. Occupation: Forensic scientistfinancial planner   Tobacco Counseling Counseling given: Not Answered  Activities of Daily Living No flowsheet data found.   Immunizations and Health Maintenance Immunization History  Administered Date(s) Administered  . Influenza Whole 03/03/2009  . Influenza, High Dose Seasonal PF 10/08/2014, 10/29/2015, 11/14/2016  . Influenza, Seasonal, Injecte, Preservative Fre 02/20/2012  . Influenza,inj,Quad PF,6+ Mos 02/18/2014, 11/23/2017  . Pneumococcal Conjugate-13 04/18/2013  . Pneumococcal Polysaccharide-23 04/20/2011  . Td 03/03/2009  . Zoster 04/20/2011   Health Maintenance Due  Topic Date Due  . Hepatitis C Screening  1950/07/26  . PNA vac Low Risk Adult (2 of 2 - PPSV23) 04/19/2016    Patient Care Team: Nelwyn SalisburyFry, Stephen A, MD as PCP - General (Family Medicine)  Indicate any recent Medical Services you may have received from other than [facility] providers in the past year (date may be approximate).    Assessment:   This is a routine wellness examination for Jeremy [name]. Physical assessment deferred to PCP.   Hearing/Vision screen No exam data present  Dietary issues and exercise activities discussed:   Diet (meal preparation, eat out, water intake, caffeinated beverages, dairy products, fruits and vegetables): {Desc; diets:16563}       Goals   None    Depression Screen PHQ 2/9 Scores 12/01/2016 10/29/2015 10/08/2014  PHQ - 2 Score 0 0 0    Fall Risk Fall Risk  12/01/2016 10/29/2015 10/08/2014  Falls in the past year? No No No    Cognitive Function:        Screening Tests Health Maintenance  Topic Date Due  . Hepatitis C Screening  1950/07/26  . PNA vac Low Risk Adult (2 of 2 - PPSV23) 04/19/2016  . TETANUS/TDAP  03/04/2019  . COLONOSCOPY  06/21/2023  . INFLUENZA VACCINE  Completed    Qualifies for Shingles Vaccine?        Plan:     I have personally reviewed and noted  the following in the patient's chart:   . Medical and social history . Use of alcohol, tobacco or illicit drugs  . Current medications and supplements . Functional ability and status . Nutritional status . Physical activity . Advanced directives . List of other physicians . Vitals . Screenings to include cognitive, depression, and falls . Referrals and appointments  In addition, I have reviewed and discussed with patient certain preventive protocols, quality metrics, and best practice recommendations. A  written personalized care plan for preventive services as well as general preventive health recommendations were provided to patient.     Brayton Layman, RN   03/12/2018

## 2018-03-13 ENCOUNTER — Ambulatory Visit: Payer: Medicare Other

## 2018-03-13 ENCOUNTER — Encounter: Payer: Medicare Other | Admitting: Family Medicine

## 2018-03-14 ENCOUNTER — Telehealth: Payer: Self-pay

## 2018-03-14 NOTE — Telephone Encounter (Signed)
Pt. cancelled CPE with Dr. Clent RidgesFry and AWV with Thereasa Parkinauthor 2/11; Thereasa Parkinauthor phoned pt. to offer to reschedule. Pt. stated he has had a cold and wants to wait to reschedule until he feels better. Advised pt. to call us if he needs anything in the meantime.

## 2018-04-02 ENCOUNTER — Ambulatory Visit (INDEPENDENT_AMBULATORY_CARE_PROVIDER_SITE_OTHER): Payer: Medicare Other | Admitting: Family Medicine

## 2018-04-02 ENCOUNTER — Encounter: Payer: Self-pay | Admitting: Family Medicine

## 2018-04-02 ENCOUNTER — Ambulatory Visit (INDEPENDENT_AMBULATORY_CARE_PROVIDER_SITE_OTHER): Payer: Medicare Other

## 2018-04-02 VITALS — BP 132/70 | HR 64 | Temp 99.2°F | Ht 69.25 in | Wt 187.0 lb

## 2018-04-02 DIAGNOSIS — Z0000 Encounter for general adult medical examination without abnormal findings: Secondary | ICD-10-CM | POA: Diagnosis not present

## 2018-04-02 DIAGNOSIS — R768 Other specified abnormal immunological findings in serum: Secondary | ICD-10-CM | POA: Diagnosis not present

## 2018-04-02 DIAGNOSIS — Z1159 Encounter for screening for other viral diseases: Secondary | ICD-10-CM | POA: Diagnosis not present

## 2018-04-02 MED ORDER — AMOXICILLIN-POT CLAVULANATE 875-125 MG PO TABS: 1.0000 | ORAL_TABLET | Freq: Two times a day (BID) | ORAL | 0 refills | Status: DC

## 2018-04-02 NOTE — Progress Notes (Signed)
Subjective:   Jeremy [name] is a 68 y.o. male who presents for an Initial Medicare Annual Wellness Visit.  Review of Systems  No ROS.  Medicare Wellness Visit. Additional risk factors are reflected in the social history.  Cardiac Risk Factors include: advanced age (>33men, >90 women);dyslipidemia;male gender Sleep patterns: awakens early, gets up 1-2 times nightly to void and sleeps 6 hours nightly. Pt. States he occasionally naps.   Home Safety/Smoke Alarms: Feels safe in home. Smoke alarms in place.  Living environment; residence and Firearm Safety: 3 story house with wife. No use or need for DME at this time. . Seat Belt Safety/Bike Helmet: Wears seat belt.   Male:   CCS- 05/2013; due 06/2023, pt. Aware.     PSA-  Lab Results  Component Value Date   PSA 0.44 12/02/2016   PSA 0.27 10/23/2015   PSA 0.28 10/02/2014      Objective:    Today's Vitals   There is no height or weight on file to calculate BMI.  Advanced Directives 04/02/2018  Does Patient Have a Medical Advance Directive? Yes  Type of Estate agent of [place];Living will  Does patient want to make changes to medical advance directive? No - Patient declined  Copy of Healthcare Power of Attorney in Chart? No - copy requested    Current Medications (verified) Outpatient Encounter Medications as of 04/02/2018  Medication Sig  . aspirin 81 MG tablet Take 81 mg by mouth daily.    . clopidogrel (PLAVIX) 75 MG tablet TAKE 1 TABLET DAILY  . fluticasone (FLONASE) 50 MCG/ACT nasal spray Place 2 sprays into the nose as directed.  . rosuvastatin (CRESTOR) 20 MG tablet TAKE 1 TABLET DAILY  . [DISCONTINUED] Ibuprofen (ADVIL) 200 MG CAPS Take 1 capsule by mouth as needed (for pain).    No facility-administered encounter medications on file as of 04/02/2018.     Allergies (verified) Patient has no known allergies.   History: Past Medical History:  Diagnosis Date  . Colon polyp 07/05/2007   TUBULAR  ADENOMA  . Coronary artery disease    Cardiac catheterization 8/07: Proximal LAD 50-75%, mid LAD 60%, ostial septal perforator 80%, proximal RCA 50% and then 80-85%, distal RCA 80-90%, EF 55-60%.  PCI was performed at that time colon distal RCA treated with a Taxus drug-eluting stent.  The proximal RCA treated with a Taxus drug eluting stent.  The LAD was studied with IVUS and treated with a Taxus drug eluding stent.    . Hyperlipidemia   . Ill-defined closed fractures of upper limb   . Sinus bradycardia    sees Dr. Graciela Husbands   . Syncope    Past Surgical History:  Procedure Laterality Date  . APPENDECTOMY    . COLONOSCOPY  06-20-13   per Dr. Leone Payor, clear, repeat in 10 yrs   . CORONARY ANGIOPLASTY     Family History  Problem Relation Age of Onset  . Prostate cancer Father   . Cancer Father        prostate cancer w/ mets  . Heart disease Father        CAD  . Hyperlipidemia Father   . Stroke Mother   . Colon cancer Maternal Uncle 60  . Heart attack Paternal Grandmother   . COPD Neg Hx   . Diabetes Neg Hx    Social History   Socioeconomic History  . Marital status: Married    Spouse name: Not on file  . Number of children:  0  . Years of education: 4  . Highest education level: Not on file  Occupational History  . Occupation: Therapist, sports: [employer]    Comment: Full time  Social Needs  . Financial resource strain: Not hard at all  . Food insecurity:    Worry: Never true    Inability: Never true  . Transportation needs:    Medical: No    Non-medical: No  Tobacco Use  . Smoking status: Former Games developer  . Smokeless tobacco: Never Used  Substance and Sexual Activity  . Alcohol use: Yes    Alcohol/week: 0.0 standard drinks    Comment: occ  . Drug use: No  . Sexual activity: Yes    Partners: Female  Lifestyle  . Physical activity:    Days per week: 3 days    Minutes per session: 60 min  . Stress: Only a little  Relationships  . Social connections:     Talks on phone: Once a week    Gets together: Three times a week    Attends religious service: Not on file    Active member of club or organization: Not on file    Attends meetings of clubs or organizations: Not on file    Relationship status: Not on file  Other Topics Concern  . Not on file  Social History Narrative   HSG, [employer] - [employer] [place]. Married ''89. No children. Occupation: Forensic scientist      04/02/2018: Lives with wife in 3 story house   Has family scattered around the [place]    Works FT as Forensic scientist, enjoys working still   Goes to gym 3days/week   Enjoys reading   Tobacco Counseling Counseling given: Not Answered   Activities of Daily Living In your present state of health, do you have any difficulty performing the following activities: 04/02/2018 04/02/2018  Hearing? N N  Vision? N N  Difficulty concentrating or making decisions? N N  Walking or climbing stairs? N N  Dressing or bathing? N N  Doing errands, shopping? N N  Preparing Food and eating ? N -  Using the Toilet? N -  In the past six months, have you accidently leaked urine? N -  Do you have problems with loss of bowel control? N -  Managing your Medications? N -  Managing your Finances? N -  Housekeeping or managing your Housekeeping? N -  Some recent data might be hidden     Immunizations and Health Maintenance Immunization History  Administered Date(s) Administered  . Influenza Whole 03/03/2009  . Influenza, High Dose Seasonal PF 10/08/2014, 10/29/2015, 11/14/2016  . Influenza, Seasonal, Injecte, Preservative Fre 02/20/2012  . Influenza,inj,Quad PF,6+ Mos 02/18/2014, 11/23/2017  . Pneumococcal Conjugate-13 04/18/2013  . Pneumococcal Polysaccharide-23 04/20/2011  . Td 03/03/2009  . Zoster 04/20/2011   Health Maintenance Due  Topic Date Due  . Hepatitis C Screening  1950-09-06    Patient Care Team: Nelwyn Salisbury, MD as PCP - General (Family Medicine) Duke Salvia,  MD as Consulting Physician (Cardiology)  Indicate any recent Medical Services you may have received from other than [facility] providers in the past year (date may be approximate).    Assessment:   This is a routine wellness examination for [place]. Physical assessment deferred to PCP.   Hearing/Vision screen Hearing Screening Comments: Able to hear conversational tones w/o difficulty. No issues reported.  R ear hearing loss that pt. 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  Vision Screening Comments: No acute vision issues. Sees [facility] routinely, author reminded pt. To schedule annual appointment.  Dietary issues and exercise activities discussed: Current Exercise Habits: Structured exercise class, Type of exercise: calisthenics, Time (Minutes): 60, Frequency (Times/Week): 3, Weekly Exercise (Minutes/Week): 180, Intensity: Moderate, Exercise limited by: cardiac condition(s) Diet (meal preparation, eat out, water intake, caffeinated beverages, dairy products, fruits and vegetables): in general, a "healthy" diet  , low salt :      Goals    . Patient Stated     Keep working for another year? Stay physically and mentally active!      Depression Screen PHQ 2/9 Scores 04/02/2018 04/02/2018 12/01/2016 10/29/2015  PHQ - 2 Score 0 0 0 0  PHQ- 9 Score 0 - - -    Fall Risk Fall Risk  04/02/2018 04/02/2018 12/01/2016 10/29/2015 10/08/2014  Falls in the past year? 0 0 No No No  Risk for fall due to : History of fall(s);Impaired vision - - - -  Follow up Falls prevention discussed - - - -     Cognitive Function:       Ad8 score reviewed for issues:  Issues making decisions: no  Less interest in hobbies / activities: no  Repeats questions, stories (family complaining): no  Trouble using ordinary gadgets (microwave, computer, phone):no  Forgets the month or year: no  Mismanaging finances: no  Remembering appts: no  Daily problems  with thinking and/or memory: no Ad8 score is= 0    Screening Tests Health Maintenance  Topic Date Due  . Hepatitis C Screening  28-Mar-1950  . PNA vac Low Risk Adult (2 of 2 - PPSV23) 05/03/2018 (Originally 04/19/2016)  . TETANUS/TDAP  03/04/2019  . COLONOSCOPY  06/21/2023  . INFLUENZA VACCINE  Completed    Qualifies for Shingles Vaccine? Yes, education provided. Dr. Clent Ridges wrote rx for pt. to receive, which he states he will do once sinus infection resolves.     Plan:    Hepatitis C order placed with other routine bloodwork from Dr. Clent Ridges  Consider getting pneumovax-23 and shingles vaccine (from pharmacy) once sinus infection resolves.  Bring a copy of your living will and/or healthcare power of attorney to your next office visit.  Continue doing brain stimulating activities (puzzles, reading, adult coloring books, staying active) to keep memory sharp.   I have personally reviewed and noted the following in the patient's chart:   . Medical and social history . Use of alcohol, tobacco or illicit drugs  . Current medications and supplements . Functional ability and status . Nutritional status . Physical activity . Advanced directives . List of other physicians . Hospitalizations, surgeries, and ER visits in previous 12 months . Vitals . Screenings to include cognitive, depression, and falls . Referrals and appointments  In addition, I have reviewed and discussed with patient certain preventive protocols, quality metrics, and best practice recommendations. A written personalized care plan for preventive services as well as general preventive health recommendations were provided to patient.     Brayton Layman, RN   04/02/2018

## 2018-04-02 NOTE — Patient Instructions (Addendum)
Hepatitis C order placed with other routine bloodwork from Dr. Clent Ridges  Consider getting pneumovax-23 and shingles vaccine (from pharmacy) once sinus infection resolves.  Bring a copy of your living will and/or healthcare power of attorney to your next office visit.  Continue doing brain stimulating activities (puzzles, reading, adult coloring books, staying active) to keep memory sharp.    Jeremy [name] , Thank you for taking time to come for your Medicare Wellness Visit. I appreciate your ongoing commitment to your health goals. Please review the following plan we discussed and let me know if I can assist you in the future.   These are the goals we discussed: Goals    . Patient Stated     Keep working for another year? Stay physically and mentally active!       This is a list of the screening recommended for you and due dates:  Health Maintenance  Topic Date Due  .  Hepatitis C: One time screening is recommended by Center for Disease Control  (CDC) for  adults born from [birth year] through 1966.   18-Feb-1950  . Pneumonia vaccines (2 of 2 - PPSV23) 05/03/2018*  . Tetanus Vaccine  03/04/2019  . Colon Cancer Screening  06/21/2023  . Flu Shot  Completed  *Topic was postponed. The date shown is not the original due date.    Pneumococcal Vaccine, Polyvalent solution for injection What is this medicine? PNEUMOCOCCAL VACCINE, POLYVALENT (NEU mo KOK al vak SEEN, pol ee VEY luhnt) is a vaccine to prevent pneumococcus bacteria infection. These bacteria are a major cause of ear infections, Strep throat infections, and serious pneumonia, meningitis, or blood infections worldwide. These vaccines help the body to produce antibodies (protective substances) that help your body defend against these bacteria. This vaccine is recommended for people 61 years of age and older with health problems. It is also recommended for all adults over 81 years old. This vaccine will not treat an infection. This medicine may be  used for other purposes; ask your health care provider or pharmacist if you have questions. COMMON BRAND NAME(S): Pneumovax 23 What should I tell my health care provider before I take this medicine? They need to know if you have any of these conditions: -bleeding problems -bone marrow or organ transplant -cancer, Hodgkin's disease -fever -infection -immune system problems -low platelet count in the blood -seizures -an unusual or allergic reaction to pneumococcal vaccine, diphtheria toxoid, other vaccines, latex, other medicines, foods, dyes, or preservatives -pregnant or trying to get pregnant -breast-feeding How should I use this medicine? This vaccine is for injection into a muscle or under the skin. It is given by a health care professional. A copy of Vaccine Information Statements will be given before each vaccination. Read this sheet carefully each time. The sheet may change frequently. Talk to your pediatrician regarding the use of this medicine in children. While this drug may be prescribed for children as young as 19 years of age for selected conditions, precautions do apply. Overdosage: If you think you have taken too much of this medicine contact a poison control center or emergency room at once. NOTE: This medicine is only for you. Do not share this medicine with others. What if I miss a dose? It is important not to miss your dose. Call your doctor or health care professional if you are unable to keep an appointment. What may interact with this medicine? -medicines for cancer chemotherapy -medicines that suppress your immune function -medicines that treat or prevent  blood clots like warfarin, enoxaparin, and dalteparin -steroid medicines like prednisone or cortisone This list may not describe all possible interactions. Give your health care provider a list of all the medicines, herbs, non-prescription drugs, or dietary supplements you use. Also tell them if you smoke, drink  alcohol, or use illegal drugs. Some items may interact with your medicine. What should I watch for while using this medicine? Mild fever and pain should go away in 3 days or less. Report any unusual symptoms to your doctor or health care professional. What side effects may I notice from receiving this medicine? Side effects that you should report to your doctor or health care professional as soon as possible: -allergic reactions like skin rash, itching or hives, swelling of the face, lips, or tongue -breathing problems -confused -fever over 102 degrees F -pain, tingling, numbness in the hands or feet -seizures -unusual bleeding or bruising -unusual muscle weakness Side effects that usually do not require medical attention (report to your doctor or health care professional if they continue or are bothersome): -aches and pains -diarrhea -fever of 102 degrees F or less -headache -irritable -loss of appetite -pain, tender at site where injected -trouble sleeping This list may not describe all possible side effects. Call your doctor for medical advice about side effects. You may report side effects to FDA at 1-800-FDA-1088. Where should I keep my medicine? This does not apply. This vaccine is given in a clinic, pharmacy, doctor's office, or other health care setting and will not be stored at home. NOTE: This sheet is a summary. It may not cover all possible information. If you have questions about this medicine, talk to your doctor, pharmacist, or health care provider.  2019 Elsevier/Gold Standard (2007-08-24 14:32:37)   Recombinant Zoster (Shingles) Vaccine, RZV: What You Need to Know 1. Why get vaccinated? Shingles (also called herpes zoster, or just zoster) is a painful skin rash, often with blisters. Shingles is caused by the varicella zoster virus, the same virus that causes chickenpox. After you have chickenpox, the virus stays in your body and can cause shingles later in life. You  can't catch shingles from another person. However, a person who has never had chickenpox (or chickenpox vaccine) could get chickenpox from someone with shingles. A shingles rash usually appears on one side of the face or body and heals within 2 to 4 weeks. Its main symptom is pain, which can be severe. Other symptoms can include fever, headache, chills, and upset stomach. Very rarely, a shingles infection can lead to pneumonia, hearing problems, blindness, brain inflammation (encephalitis), or death. For about 1 person in 5, severe pain can continue even long after the rash has cleared up. This long-lasting pain is called post-herpetic neuralgia (PHN). Shingles is far more common in people 69 years of age and older than in younger people, and the risk increases with age. It is also more common in people whose immune system is weakened because of a disease such as cancer, or by drugs such as steroids or chemotherapy. At least 1 million people a year in the [place] get shingles. 2. Shingles vaccine (recombinant) Recombinant shingles vaccine was approved by FDA in 2017 for the prevention of shingles. In clinical trials, it was more than 90% effective in preventing shingles. It can also reduce the likelihood of PHN. Two doses, 2 to 6 months apart, are recommended for adults 50 and older. This vaccine is also recommended for people who have already gotten the live shingles vaccine (  Zostavax). There is no live virus in this vaccine. 3. Some people should not get this vaccine Tell your vaccine provider if you:  Have any severe, life-threatening allergies. A person who has ever had a life-threatening allergic reaction after a dose of recombinant shingles vaccine, or has a severe allergy to any component of this vaccine, may be advised not to be vaccinated. Ask your health care provider if you want information about vaccine components.  Are pregnant or breastfeeding. There is not much information about  use of recombinant shingles vaccine in pregnant or nursing women. Your healthcare provider might recommend delaying vaccination.  Are not feeling well. If you have a mild illness, such as a cold, you can probably get the vaccine today. If you are moderately or severely ill, you should probably wait until you recover. Your doctor can advise you. 4. Risks of a vaccine reaction With any medicine, including vaccines, there is a chance of reactions. After recombinant shingles vaccination, a person might experience:  Pain, redness, soreness, or swelling at the site of the injection  Headache, muscle aches, fever, shivering, fatigue In clinical trials, most people got a sore arm with mild or moderate pain after vaccination, and some also had redness and swelling where they got the shot. Some people felt tired, had muscle pain, a headache, shivering, fever, stomach pain, or nausea. About 1 out of 6 people who got recombinant zoster vaccine experienced side effects that prevented them from doing regular activities. Symptoms went away on their own in about 2 to 3 days. Side effects were more common in younger people. You should still get the second dose of recombinant zoster vaccine even if you had one of these reactions after the first dose. Other things that could happen after this vaccine:  People sometimes faint after medical procedures, including vaccination. Sitting or lying down for about 15 minutes can help prevent fainting and injuries caused by a fall. Tell your provider if you feel dizzy or have vision changes or ringing in the ears.  Some people get shoulder pain that can be more severe and longer-lasting than routine soreness that can follow injections. This happens very rarely.  Any medication can cause a severe allergic reaction. Such reactions to a vaccine are estimated at about 1 in a million doses, and would happen within a few minutes to a few hours after the vaccination. As with any  medicine, there is a very remote chance of a vaccine causing a serious injury or death. The safety of vaccines is always being monitored. For more information, visit: http://floyd.org/ 5. What if there is a serious problem? What should I look for?  Look for anything that concerns you, such as signs of a severe allergic reaction, very high fever, or unusual behavior. Signs of a severe allergic reaction can include hives, swelling of the face and throat, difficulty breathing, a fast heartbeat, dizziness, and weakness. These would usually start a few minutes to a few hours after the vaccination. What should I do?  If you think it is a severe allergic reaction or other emergency that can't wait, call 9-1-1 or get to the nearest hospital. Otherwise, call your health care provider. Afterward, the reaction should be reported to the Vaccine Adverse Event Reporting System (VAERS). Your doctor should file this report, or you can do it yourself through the VAERS website at www.vaers.LAgents.no, or by calling 1-276 742 7614. VAERS does not give medical advice. 6. How can I learn more?  Ask your health care  provider. He or she can give you the vaccine package insert or suggest other sources of information.  Call your local or state health department.  Contact the Centers for Disease Control and Prevention (CDC): ? Call 559-069-6046 (1-800-CDC-INFO) or ? Visit CDC's vaccines website at PicCapture.uy CDC Vaccine Information Statement Recombinant Zoster Vaccine (03/14/2016) This information is not intended to replace advice given to you by your health care provider. Make sure you discuss any questions you have with your health care provider. Document Released: 03/29/2016 Document Revised: 08/23/2017 Document Reviewed: 08/23/2017 Elsevier Interactive Patient Education  2019 [employer].   Fall Prevention in the Home, Adult Falls can cause injuries. They can happen to people of all ages.  There are many things you can do to make your home safe and to help prevent falls. Ask for help when making these changes, if needed. What actions can I take to prevent falls? General Instructions  Use good lighting in all rooms. Replace any light bulbs that burn out.  Turn on the lights when you go into a dark area. Use night-lights.  Keep items that you use often in easy-to-reach places. Lower the shelves around your home if necessary.  Set up your furniture so you have a clear path. Avoid moving your furniture around.  Do not have throw rugs and other things on the floor that can make you trip.  Avoid walking on wet floors.  If any of your floors are uneven, fix them.  Add color or contrast paint or tape to clearly mark and help you see: ? Any grab bars or handrails. ? First and last steps of stairways. ? Where the edge of each step is.  If you use a stepladder: ? Make sure that it is fully opened. Do not climb a closed stepladder. ? Make sure that both sides of the stepladder are locked into place. ? Ask someone to hold the stepladder for you while you use it.  If there are any pets around you, be aware of where they are. What can I do in the bathroom?      Keep the floor dry. Clean up any water that spills onto the floor as soon as it happens.  Remove soap buildup in the tub or shower regularly.  Use non-skid mats or decals on the floor of the tub or shower.  Attach bath mats securely with double-sided, non-slip rug tape.  If you need to sit down in the shower, use a plastic, non-slip stool.  Install grab bars by the toilet and in the tub and shower. Do not use towel bars as grab bars. What can I do in the bedroom?  Make sure that you have a light by your bed that is easy to reach.  Do not use any sheets or blankets that are too big for your bed. They should not hang down onto the floor.  Have a firm chair that has side arms. You can use this for support while  you get dressed. What can I do in the kitchen?  Clean up any spills right away.  If you need to reach something above you, use a strong step stool that has a grab bar.  Keep electrical cords out of the way.  Do not use floor polish or wax that makes floors slippery. If you must use wax, use non-skid floor wax. What can I do with my stairs?  Do not leave any items on the stairs.  Make sure that you have a  light switch at the top of the stairs and the bottom of the stairs. If you do not have them, ask someone to add them for you.  Make sure that there are handrails on both sides of the stairs, and use them. Fix handrails that are broken or loose. Make sure that handrails are as long as the stairways.  Install non-slip stair treads on all stairs in your home.  Avoid having throw rugs at the top or bottom of the stairs. If you do have throw rugs, attach them to the floor with carpet tape.  Choose a carpet that does not hide the edge of the steps on the stairway.  Check any carpeting to make sure that it is firmly attached to the stairs. Fix any carpet that is loose or worn. What can I do on the outside of my home?  Use bright outdoor lighting.  Regularly fix the edges of walkways and driveways and fix any cracks.  Remove anything that might make you trip as you walk through a door, such as a raised step or threshold.  Trim any bushes or trees on the path to your home.  Regularly check to see if handrails are loose or broken. Make sure that both sides of any steps have handrails.  Install guardrails along the edges of any raised decks and porches.  Clear walking paths of anything that might make someone trip, such as tools or rocks.  Have any leaves, snow, or ice cleared regularly.  Use sand or salt on walking paths during winter.  Clean up any spills in your garage right away. This includes grease or oil spills. What other actions can I take?  Wear shoes that: ? Have a  low heel. Do not wear high heels. ? Have rubber bottoms. ? Are comfortable and fit you well. ? Are closed at the toe. Do not wear open-toe sandals.  Use tools that help you move around (mobility aids) if they are needed. These include: ? Canes. ? Walkers. ? Scooters. ? Crutches.  Review your medicines with your doctor. Some medicines can make you feel dizzy. This can increase your chance of falling. Ask your doctor what other things you can do to help prevent falls. Where to find more information  Centers for Disease Control and Prevention, STEADI: HealthcareCounselor.com.pt  [employer] on Aging: RingConnections.si Contact a doctor if:  You are afraid of falling at home.  You feel weak, drowsy, or dizzy at home.  You fall at home. Summary  There are many simple things that you can do to make your home safe and to help prevent falls.  Ways to make your home safe include removing tripping hazards and installing grab bars in the bathroom.  Ask for help when making these changes in your home. This information is not intended to replace advice given to you by your health care provider. Make sure you discuss any questions you have with your health care provider. Document Released: 11/13/2008 Document Revised: 09/01/2016 Document Reviewed: 09/01/2016 Elsevier Interactive Patient Education  2019 [employer].

## 2018-04-02 NOTE — Progress Notes (Signed)
Subjective:    Patient ID: Jeremy [name], male    DOB: 02-Aug-1950, 68 y.o.   MRN: [id number]  HPI Here for a well exam and to discuss 3 weeks of coughing. He has had low grade fevers and some PND, along with a dry cough. Using Nyquil and Zyrtec. Other than that he has felt great. He works out 3 days a week.    Review of Systems  Constitutional: Positive for fever.  HENT: Positive for congestion and postnasal drip.   Eyes: Negative.   Respiratory: Positive for cough.   Cardiovascular: Negative.   Gastrointestinal: Negative.   Genitourinary: Negative.   Musculoskeletal: Negative.   Skin: Negative.   Neurological: Negative.   Psychiatric/Behavioral: Negative.        Objective:   Physical Exam Constitutional:      General: He is not in acute distress.    Appearance: He is well-developed. He is not diaphoretic.  HENT:     Head: Normocephalic and atraumatic.     Right Ear: External ear normal.     Left Ear: External ear normal.     Nose: Nose normal.     Mouth/Throat:     Pharynx: No oropharyngeal exudate.  Eyes:     General: No scleral icterus.       Right eye: No discharge.        Left eye: No discharge.     Conjunctiva/sclera: Conjunctivae normal.     Pupils: Pupils are equal, round, and reactive to light.  Neck:     Musculoskeletal: Neck supple.     Thyroid: No thyromegaly.     Vascular: No JVD.     Trachea: No tracheal deviation.  Cardiovascular:     Rate and Rhythm: Normal rate and regular rhythm.     Heart sounds: Normal heart sounds. No murmur. No friction rub. No gallop.   Pulmonary:     Effort: Pulmonary effort is normal. No respiratory distress.     Breath sounds: Normal breath sounds. No wheezing or rales.  Chest:     Chest wall: No tenderness.  Abdominal:     General: Bowel sounds are normal. There is no distension.     Palpations: Abdomen is soft. There is no mass.     Tenderness: There is no abdominal tenderness. There is no guarding or rebound.    Genitourinary:    Penis: Normal. No tenderness.      Prostate: Normal.     Rectum: Normal. Guaiac result negative.  Musculoskeletal: Normal range of motion.        General: No tenderness.  Lymphadenopathy:     Cervical: No cervical adenopathy.  Skin:    General: Skin is warm and dry.     Coloration: Skin is not pale.     Findings: No erythema or rash.  Neurological:     Mental Status: He is alert and oriented to person, place, and time.     Cranial Nerves: No cranial nerve deficit.     Motor: No abnormal muscle tone.     Coordination: Coordination normal.     Deep Tendon Reflexes: Reflexes are normal and symmetric. Reflexes normal.  Psychiatric:        Behavior: Behavior normal.        Thought Content: Thought content normal.        Judgment: Judgment normal.           Assessment & Plan:  Well exam. We discussed diet and exercise. Get fasting labs  soon. He also has a lingering sinusitis and we will treat this with Augmentin. Gershon Crane, MD

## 2018-04-05 ENCOUNTER — Other Ambulatory Visit (INDEPENDENT_AMBULATORY_CARE_PROVIDER_SITE_OTHER): Payer: Medicare Other

## 2018-04-05 DIAGNOSIS — Z0000 Encounter for general adult medical examination without abnormal findings: Secondary | ICD-10-CM

## 2018-04-05 DIAGNOSIS — Z1159 Encounter for screening for other viral diseases: Secondary | ICD-10-CM | POA: Diagnosis not present

## 2018-04-05 LAB — POC URINALSYSI DIPSTICK (AUTOMATED)
Bilirubin, UA: NEGATIVE
Blood, UA: NEGATIVE
Glucose, UA: NEGATIVE
Ketones, UA: NEGATIVE
Leukocytes, UA: NEGATIVE
Nitrite, UA: NEGATIVE
Protein, UA: POSITIVE — AB
Spec Grav, UA: 1.02 (ref 1.010–1.025)
Urobilinogen, UA: 0.2 E.U./dL
pH, UA: 6 (ref 5.0–8.0)

## 2018-04-05 LAB — CBC WITH DIFFERENTIAL/PLATELET
BASOS ABS: 0 10*3/uL (ref 0.0–0.1)
Basophils Relative: 0.6 % (ref 0.0–3.0)
Eosinophils Absolute: 0.3 10*3/uL (ref 0.0–0.7)
Eosinophils Relative: 4.1 % (ref 0.0–5.0)
HCT: 42.5 % (ref 39.0–52.0)
Hemoglobin: 14.4 g/dL (ref 13.0–17.0)
Lymphocytes Relative: 21.7 % (ref 12.0–46.0)
Lymphs Abs: 1.5 10*3/uL (ref 0.7–4.0)
MCHC: 33.7 g/dL (ref 30.0–36.0)
MCV: 89.5 fl (ref 78.0–100.0)
Monocytes Absolute: 0.8 10*3/uL (ref 0.1–1.0)
Monocytes Relative: 11.6 % (ref 3.0–12.0)
NEUTROS ABS: 4.3 10*3/uL (ref 1.4–7.7)
Neutrophils Relative %: 62 % (ref 43.0–77.0)
Platelets: 283 10*3/uL (ref 150.0–400.0)
RBC: 4.75 Mil/uL (ref 4.22–5.81)
RDW: 13.8 % (ref 11.5–15.5)
WBC: 6.9 10*3/uL (ref 4.0–10.5)

## 2018-04-05 LAB — HEPATIC FUNCTION PANEL
ALT: 21 U/L (ref 0–53)
AST: 19 U/L (ref 0–37)
Albumin: 4.2 g/dL (ref 3.5–5.2)
Alkaline Phosphatase: 85 U/L (ref 39–117)
Bilirubin, Direct: 0.2 mg/dL (ref 0.0–0.3)
Total Bilirubin: 0.6 mg/dL (ref 0.2–1.2)
Total Protein: 7.1 g/dL (ref 6.0–8.3)

## 2018-04-05 LAB — BASIC METABOLIC PANEL
BUN: 18 mg/dL (ref 6–23)
CO2: 30 meq/L (ref 19–32)
Calcium: 9.1 mg/dL (ref 8.4–10.5)
Chloride: 100 mEq/L (ref 96–112)
Creatinine, Ser: 0.92 mg/dL (ref 0.40–1.50)
GFR: 81.6 mL/min (ref >60.00)
Glucose, Bld: 87 mg/dL (ref 70–99)
Potassium: 4.4 mEq/L (ref 3.5–5.1)
Sodium: 138 mEq/L (ref 135–145)

## 2018-04-05 LAB — LIPID PANEL
CHOL/HDL RATIO: 3
Cholesterol: 131 mg/dL (ref 0–200)
HDL: 39.2 mg/dL (ref >39.00)
LDL Cholesterol: 74 mg/dL (ref 0–99)
NonHDL: 91.72
Triglycerides: 88 mg/dL (ref 0.0–149.0)
VLDL: 17.6 mg/dL (ref 0.0–40.0)

## 2018-04-05 LAB — TSH: TSH: 1.67 u[IU]/mL (ref 0.35–4.50)

## 2018-04-05 LAB — PSA: PSA: 0.43 ng/mL (ref 0.10–4.00)

## 2018-04-09 ENCOUNTER — Encounter: Payer: Self-pay | Admitting: *Deleted

## 2018-04-09 LAB — HEPATITIS C ANTIBODY
Hepatitis C Ab: REACTIVE — AB
SIGNAL TO CUT-OFF: 5.34 — ABNORMAL HIGH (ref <1.00)

## 2018-04-09 LAB — HCV RNA,QUANTITATIVE REAL TIME PCR
HCV Quantitative Log: <1.18 Log IU/mL
HCV RNA, PCR, QN: <15 IU/mL

## 2018-04-09 NOTE — Addendum Note (Signed)
Addended by: Gershon Crane A on: 04/09/2018 08:17 AM   Modules accepted: Orders

## 2018-04-13 ENCOUNTER — Encounter: Payer: Self-pay | Admitting: Family Medicine

## 2018-04-16 NOTE — Telephone Encounter (Signed)
FYI for Dr. Fry.  Thanks  

## 2018-05-01 ENCOUNTER — Other Ambulatory Visit: Payer: Self-pay | Admitting: Family Medicine

## 2018-06-08 ENCOUNTER — Telehealth: Payer: Self-pay

## 2018-06-08 NOTE — Telephone Encounter (Signed)
I called and left patient a message about changing over office visit on 06/14/18 to a telehealth visit.

## 2018-06-11 NOTE — Telephone Encounter (Signed)
New Message  Patient returning your call about appointment please call to setup.

## 2018-06-11 NOTE — Telephone Encounter (Signed)
I called and spoke with patient, he states that he does not want to do a telehealth visit. He wants to wait to come back into the office. Recall entered and appointment cancelled.

## 2018-06-14 ENCOUNTER — Telehealth: Payer: Medicare Other | Admitting: Internal Medicine

## 2018-08-28 ENCOUNTER — Telehealth: Payer: Self-pay | Admitting: Internal Medicine

## 2018-08-28 NOTE — Telephone Encounter (Signed)

## 2018-08-28 NOTE — Telephone Encounter (Signed)
New Message ° ° ° °Left message to confirm appt and answer covid questions  °

## 2018-08-28 NOTE — Telephone Encounter (Signed)
error 

## 2018-08-29 ENCOUNTER — Encounter: Payer: Self-pay | Admitting: Internal Medicine

## 2018-08-29 ENCOUNTER — Other Ambulatory Visit: Payer: Self-pay

## 2018-08-29 ENCOUNTER — Ambulatory Visit (INDEPENDENT_AMBULATORY_CARE_PROVIDER_SITE_OTHER): Payer: Medicare Other | Admitting: Internal Medicine

## 2018-08-29 VITALS — BP 130/66 | HR 55 | Ht 69.25 in | Wt 182.8 lb

## 2018-08-29 DIAGNOSIS — R55 Syncope and collapse: Secondary | ICD-10-CM | POA: Diagnosis not present

## 2018-08-29 DIAGNOSIS — I2510 Atherosclerotic heart disease of native coronary artery without angina pectoris: Secondary | ICD-10-CM

## 2018-08-29 DIAGNOSIS — R001 Bradycardia, unspecified: Secondary | ICD-10-CM | POA: Diagnosis not present

## 2018-08-29 NOTE — Patient Instructions (Signed)

## 2018-08-29 NOTE — Progress Notes (Signed)
      Patient Care Team: Laurey Morale, MD as PCP - General (Family Medicine) Deboraha Sprang, MD as Consulting Physician (Cardiology)   HPI  Jeremy [name] is a 68 y.o. male Seen in followup for syncope. He has had 2 episodes one remotely and one more recently in [place].  He also is a history of coronary artery disease in his distal RCA was treated with an Taxus drug-eluting stent at the site after his first syncopal episode.Marland Kitchen He also had stenting of his proximal RCA and of his LAD. Because this was found unsuspectedly, he was admitted for repeat catheterization following a syncopal episode which demonstrated no significant progression of his coronary disease.     I have reviewed his catheterization with Dr. Billee Cashing. The plan will be long term DAPT   The patient denies chest pain, shortness of breath, nocturnal dyspnea, orthopnea or peripheral edema.  There have been no palpitations, lightheadedness or syncope.    Date Cr K LDL  11/18 0.91 4.9   3/20  0.92 4.4 74     Past Medical History:  Diagnosis Date  . Colon polyp 07/05/2007   TUBULAR ADENOMA  . Coronary artery disease    Cardiac catheterization 8/07: Proximal LAD 50-75%, mid LAD 60%, ostial septal perforator 80%, proximal RCA 50% and then 80-85%, distal RCA 80-90%, EF 55-60%.  PCI was performed at that time colon distal RCA treated with a Taxus drug-eluting stent.  The proximal RCA treated with a Taxus drug eluting stent.  The LAD was studied with IVUS and treated with a Taxus drug eluding stent.    . Hyperlipidemia   . Ill-defined closed fractures of upper limb   . Sinus bradycardia    sees Dr. Caryl Comes   . Syncope     Past Surgical History:  Procedure Laterality Date  . APPENDECTOMY    . COLONOSCOPY  06-20-13   per Dr. Carlean Purl, clear, repeat in 10 yrs   . CORONARY ANGIOPLASTY      Current Outpatient Medications  Medication Sig Dispense Refill  . aspirin 81 MG tablet Take 81 mg by mouth daily.      . clopidogrel  (PLAVIX) 75 MG tablet TAKE 1 TABLET DAILY 90 tablet 3  . fluticasone (FLONASE) 50 MCG/ACT nasal spray Place 2 sprays into the nose as directed. 48 g 3  . rosuvastatin (CRESTOR) 20 MG tablet TAKE 1 TABLET DAILY (WILL NEED OFFICE VISIT FOR FURTHER REFILLS, THANKS) 90 tablet 3   No current facility-administered medications for this visit.     No Known Allergies  Review of Systems negative except from HPI and PMH  Physical Exam BP 130/66   Pulse (!) 55   Ht 5' 9.25" (1.759 m)   Wt 182 lb 12.8 oz (82.9 kg)   SpO2 95%   BMI 26.80 kg/m   Well developed and nourished in no acute distress HENT normal Neck supple with JVP-  flat   Clear Regular rate and rhythm, no murmurs or gallops Abd-soft with active BS No Clubbing cyanosis edema Skin-warm and dry A & Oriented  Grossly normal sensory and motor function  ECG sinus 55 15/03/41   Assessment and  Plan  Syncope  Coronary artery disease status post Taxus stenting RCA/LAD 2007--discussion with [facility] long term DAPT  Hyperlipidemia    LDL remains near goal  No syncope  Without symptoms of ischemia

## 2018-09-24 ENCOUNTER — Ambulatory Visit: Payer: Medicare Other | Admitting: Internal Medicine

## 2018-11-27 ENCOUNTER — Other Ambulatory Visit: Payer: Self-pay

## 2018-11-27 ENCOUNTER — Encounter: Payer: Self-pay | Admitting: Family Medicine

## 2018-11-27 ENCOUNTER — Ambulatory Visit (INDEPENDENT_AMBULATORY_CARE_PROVIDER_SITE_OTHER): Payer: Medicare Other

## 2018-11-27 DIAGNOSIS — Z23 Encounter for immunization: Secondary | ICD-10-CM | POA: Diagnosis not present

## 2019-02-02 ENCOUNTER — Ambulatory Visit (HOSPITAL_COMMUNITY)
Admission: EM | Admit: 2019-02-02 | Discharge: 2019-02-02 | Disposition: A | Discharge status: Home / Self Care | Payer: Medicare Other | Attending: Urgent Care | Admitting: Urgent Care

## 2019-02-02 ENCOUNTER — Encounter (HOSPITAL_COMMUNITY): Payer: Self-pay

## 2019-02-02 ENCOUNTER — Other Ambulatory Visit: Payer: Self-pay

## 2019-02-02 DIAGNOSIS — M5442 Lumbago with sciatica, left side: Secondary | ICD-10-CM | POA: Diagnosis not present

## 2019-02-02 DIAGNOSIS — M5432 Sciatica, left side: Secondary | ICD-10-CM

## 2019-02-02 MED ORDER — PREDNISONE 20 MG PO TABS: ORAL_TABLET | ORAL | 0 refills | Status: DC

## 2019-02-02 MED ORDER — CYCLOBENZAPRINE HCL 5 MG PO TABS: 5.0000 mg | ORAL_TABLET | Freq: Every evening | ORAL | 0 refills | Status: DC | PRN

## 2019-02-02 NOTE — ED Triage Notes (Signed)
Patient presents to Urgent Care with complaints of left hip pain radiating down his leg since 2-3 days ago. Patient reports he thinks it is sciatica, has not had this happen in the past. Pt denies known injuries.

## 2019-02-02 NOTE — ED Provider Notes (Signed)
[facility]   MRN: [id number] DOB: [birth year]  Subjective:   Jeremy [name] is a 69 y.o. male presenting for several day hx of progressively worsening left low back/buttock that radiates to the posterior thigh.  Denies any aggravating factors including falls, trauma or car accident.  Denies history of arthritis.  Has not had problems with sciatica before but feels like that is what this is.  Denies any history of blood in his urine or stools.  No weakness.  No current facility-administered medications for this encounter.  Current Outpatient Medications:  .  aspirin 81 MG tablet, Take 81 mg by mouth daily.  , Disp: , Rfl:  .  clopidogrel (PLAVIX) 75 MG tablet, TAKE 1 TABLET DAILY, Disp: 90 tablet, Rfl: 3 .  fluticasone (FLONASE) 50 MCG/ACT nasal spray, Place 2 sprays into the nose as directed., Disp: 48 g, Rfl: 3 .  rosuvastatin (CRESTOR) 20 MG tablet, TAKE 1 TABLET DAILY (WILL NEED OFFICE VISIT FOR FURTHER REFILLS, THANKS), Disp: 90 tablet, Rfl: 3   No Known Allergies  Past Medical History:  Diagnosis Date  . Colon polyp 07/05/2007   TUBULAR ADENOMA  . Coronary artery disease    Cardiac catheterization 8/07: Proximal LAD 50-75%, mid LAD 60%, ostial septal perforator 80%, proximal RCA 50% and then 80-85%, distal RCA 80-90%, EF 55-60%.  PCI was performed at that time colon distal RCA treated with a Taxus drug-eluting stent.  The proximal RCA treated with a Taxus drug eluting stent.  The LAD was studied with IVUS and treated with a Taxus drug eluding stent.    . Hyperlipidemia   . Ill-defined closed fractures of upper limb   . Sinus bradycardia    sees Dr. Caryl Comes   . Syncope      Past Surgical History:  Procedure Laterality Date  . APPENDECTOMY    . COLONOSCOPY  06-20-13   per Dr. Carlean Purl, clear, repeat in 10 yrs   . CORONARY ANGIOPLASTY      Family History  Problem Relation Age of Onset  . Prostate cancer Father   . Cancer Father        prostate cancer w/ mets  . Heart  disease Father        CAD  . Hyperlipidemia Father   . Stroke Mother   . Colon cancer Maternal Uncle 52  . Heart attack Paternal Grandmother   . COPD Neg Hx   . Diabetes Neg Hx     Social History   Tobacco Use  . Smoking status: Former Research scientist (life sciences)  . Smokeless tobacco: Never Used  Substance Use Topics  . Alcohol use: Yes    Alcohol/week: 0.0 standard drinks    Comment: occ  . Drug use: No    ROS As above.  Objective:   Vitals: BP (!) 147/57 (BP Location: Right Arm)   Pulse 63   Temp 98 F (36.7 C) (Oral)   Resp 16   SpO2 100%   Physical Exam Constitutional:      General: He is not in acute distress.    Appearance: Normal appearance. He is well-developed and normal weight. He is not ill-appearing, toxic-appearing or diaphoretic.  HENT:     Head: Normocephalic and atraumatic.     Right Ear: External ear normal.     Left Ear: External ear normal.     Nose: Nose normal.     Mouth/Throat:     Pharynx: Oropharynx is clear.  Eyes:     General: No scleral icterus.  Right eye: No discharge.        Left eye: No discharge.     Extraocular Movements: Extraocular movements intact.     Pupils: Pupils are equal, round, and reactive to light.  Cardiovascular:     Rate and Rhythm: Normal rate.  Pulmonary:     Effort: Pulmonary effort is normal.  Musculoskeletal:     Cervical back: Normal range of motion.     Lumbar back: Tenderness (Over area outlined) present. No swelling, edema, deformity, signs of trauma, lacerations, spasms or bony tenderness. Normal range of motion. Positive left straight leg raise test. Negative right straight leg raise test. No scoliosis.       Back:  Neurological:     Mental Status: He is alert and oriented to person, place, and time.     Motor: No weakness.     Coordination: Coordination normal.     Gait: Gait normal.     Deep Tendon Reflexes: Reflexes normal.  Psychiatric:        Mood and Affect: Mood normal.        Behavior: Behavior  normal.        Thought Content: Thought content normal.        Judgment: Judgment normal.      Assessment and Plan :   1. Acute left-sided low back pain with left-sided sciatica   2. Sciatica of left side     Will use prednisone course to manage for sciatica.  Offered patient muscle relaxant as well.  Patient declined x-rays. Counseled patient on potential for adverse effects with medications prescribed/recommended today, ER and return-to-clinic precautions discussed, patient verbalized understanding.    Wallis Bamberg, [place] 02/02/19 1611

## 2019-02-06 ENCOUNTER — Telehealth: Payer: Self-pay | Admitting: *Deleted

## 2019-02-06 NOTE — Telephone Encounter (Signed)
Copied from CRM [id number]. Topic: General - Other >> Feb 06, 2019  9:33 AM Herby Abraham C wrote: Reason for CRM: pt called in for assistance. Pt says that he had a ED visit and was prescribed medication. Pt says that he is still having some pain and his medication is almost gone. Pt would like to be advised further?   CB: [phone]  Spoke with patient. Patient was dx with sciatica and rx'd  Prednisone and Flexaril. Patient reports the pain is still the same maybe a little worse. Patient takes his last dose of prednisone tomorrow. Offered patient an appointment. Patient does not think its necessary at this moment if PCP can rx something else or extend his prednisone dose. Please advise

## 2019-02-07 MED ORDER — TRAMADOL HCL 50 MG PO TABS: 100.0000 mg | ORAL_TABLET | Freq: Four times a day (QID) | ORAL | 0 refills | Status: DC | PRN

## 2019-02-07 NOTE — Telephone Encounter (Signed)
I sent in Tramadol to try

## 2019-02-07 NOTE — Telephone Encounter (Signed)
Pt stated that he is in pain and would like to know if he could be worked in today or if Dr Clent Ridges would send in meds for him.  Please advise

## 2019-02-07 NOTE — Addendum Note (Signed)
Addended by: Gershon Crane A on: 02/07/2019 01:09 PM   Modules accepted: Orders

## 2019-02-07 NOTE — Telephone Encounter (Signed)
Patient is aware 

## 2019-02-07 NOTE — Telephone Encounter (Signed)
Message Routed to PCP CMA 

## 2019-02-07 NOTE — Telephone Encounter (Signed)
Spoke with patient, an ED follow up has been scheduled for 02/08/2019. Patient stated that he has finished his medication from the hospital but is still in pain. He would like to know if there is anything over the counter he could take until his appointment.

## 2019-02-08 ENCOUNTER — Telehealth (INDEPENDENT_AMBULATORY_CARE_PROVIDER_SITE_OTHER): Payer: Medicare Other | Admitting: Family Medicine

## 2019-02-08 ENCOUNTER — Other Ambulatory Visit: Payer: Self-pay

## 2019-02-08 DIAGNOSIS — M5442 Lumbago with sciatica, left side: Secondary | ICD-10-CM | POA: Diagnosis not present

## 2019-02-10 DIAGNOSIS — M5442 Lumbago with sciatica, left side: Secondary | ICD-10-CM | POA: Insufficient documentation

## 2019-02-10 NOTE — Progress Notes (Signed)
Virtual Visit via Telephone Note  I connected with the patient on 02/10/19 at  3:15 PM EST by telephone and verified that I am speaking with the correct person using two identifiers.   I discussed the limitations, risks, security and privacy concerns of performing an evaluation and management service by telephone and the availability of in person appointments. I also discussed with the patient that there may be a patient responsible charge related to this service. The patient expressed understanding and agreed to proceed.  Location patient: home Location provider: work or home office Participants present for the call: patient, provider Patient did not have a visit in the prior 7 days to address this/these issue(s).   History of Present Illness: Here to follow up an urgent care visit on 02-02-19 for left sided low back pain that radiates down the left leg. This started about 2 weeks ago and has become fairly severe. He has numbness down the left leg as well but no weakness. No recent trauma. He was given Flexeril and a Prednisone taper, but the pain is still not controlled.    Observations/Objective: Patient sounds cheerful and well on the phone. I do not appreciate any SOB. Speech and thought processing are grossly intact. Patient reported vitals:  Assessment and Plan: Low back pain with left sciatica. Add Tramadol to use for pain. Set up an MRI of the lumbar spine soon.  Gershon Crane, MD   Follow Up Instructions:     [phone] 11-20 [id number] 21-30 I did not refer this patient for an OV in the next 24 hours for this/these issue(s).  I discussed the assessment and treatment plan with the patient. The patient was provided an opportunity to ask questions and all were answered. The patient agreed with the plan and demonstrated an understanding of the instructions.   The patient was advised to call back or seek an in-person evaluation if the symptoms worsen or if the condition fails  to improve as anticipated.  I provided 14 minutes of non-face-to-face time during this encounter.   Gershon Crane, MD

## 2019-02-16 ENCOUNTER — Ambulatory Visit
Admission: RE | Admit: 2019-02-16 | Discharge: 2019-02-16 | Disposition: A | Discharge status: Home / Self Care | Payer: Medicare Other | Source: Ambulatory Visit | Attending: Family Medicine | Admitting: Family Medicine

## 2019-02-16 ENCOUNTER — Other Ambulatory Visit: Payer: Self-pay

## 2019-02-16 DIAGNOSIS — M5442 Lumbago with sciatica, left side: Secondary | ICD-10-CM

## 2019-02-18 NOTE — Addendum Note (Signed)
Addended by: Gershon Crane A on: 02/18/2019 07:58 AM   Modules accepted: Orders

## 2019-02-23 ENCOUNTER — Encounter: Payer: Self-pay | Admitting: Family Medicine

## 2019-05-02 ENCOUNTER — Other Ambulatory Visit: Payer: Self-pay | Admitting: Family Medicine

## 2019-07-31 ENCOUNTER — Encounter: Payer: Self-pay | Admitting: Family Medicine

## 2019-08-16 ENCOUNTER — Other Ambulatory Visit: Payer: Self-pay

## 2019-08-16 ENCOUNTER — Ambulatory Visit (INDEPENDENT_AMBULATORY_CARE_PROVIDER_SITE_OTHER): Payer: Medicare Other | Admitting: Family Medicine

## 2019-08-16 ENCOUNTER — Encounter: Payer: Self-pay | Admitting: Family Medicine

## 2019-08-16 VITALS — BP 130/60 | HR 61 | Temp 97.9°F | Ht 69.25 in | Wt 182.6 lb

## 2019-08-16 DIAGNOSIS — R001 Bradycardia, unspecified: Secondary | ICD-10-CM

## 2019-08-16 DIAGNOSIS — N138 Other obstructive and reflux uropathy: Secondary | ICD-10-CM

## 2019-08-16 DIAGNOSIS — E785 Hyperlipidemia, unspecified: Secondary | ICD-10-CM

## 2019-08-16 DIAGNOSIS — N401 Enlarged prostate with lower urinary tract symptoms: Secondary | ICD-10-CM | POA: Diagnosis not present

## 2019-08-16 DIAGNOSIS — Z7902 Long term (current) use of antithrombotics/antiplatelets: Secondary | ICD-10-CM | POA: Diagnosis not present

## 2019-08-16 DIAGNOSIS — I2510 Atherosclerotic heart disease of native coronary artery without angina pectoris: Secondary | ICD-10-CM

## 2019-08-16 MED ORDER — TAMSULOSIN HCL 0.4 MG PO CAPS: 0.4000 mg | ORAL_CAPSULE | Freq: Every day | ORAL | 3 refills | Status: DC

## 2019-08-16 NOTE — Addendum Note (Signed)
Addended by: Lerry Liner on: 08/16/2019 04:30 PM   Modules accepted: Orders

## 2019-08-16 NOTE — Progress Notes (Signed)
Subjective:    Patient ID: Jeremy [name], male    DOB: 1950/11/04, 69 y.o.   MRN: [id number]  HPI Here to follow up on issues. He has a few concerns. First he has been getting up to urinate 2-3 times a night, and then he has trouble falling back asleep. No urninary discomfort. His BP is stable. He walks every day, either outdoors or on his treadmill. He is scheduled to see Dr. Graciela Husbands in September.    Review of Systems  Constitutional: Negative.   HENT: Negative.   Eyes: Negative.   Respiratory: Negative.   Cardiovascular: Negative.   Gastrointestinal: Negative.   Genitourinary: Positive for frequency.  Musculoskeletal: Negative.   Skin: Negative.   Neurological: Negative.   Psychiatric/Behavioral: Negative.        Objective:   Physical Exam Constitutional:      General: He is not in acute distress.    Appearance: He is well-developed. He is not diaphoretic.  HENT:     Head: Normocephalic and atraumatic.     Right Ear: External ear normal.     Left Ear: External ear normal.     Nose: Nose normal.     Mouth/Throat:     Pharynx: No oropharyngeal exudate.  Eyes:     General: No scleral icterus.       Right eye: No discharge.        Left eye: No discharge.     Conjunctiva/sclera: Conjunctivae normal.     Pupils: Pupils are equal, round, and reactive to light.  Neck:     Thyroid: No thyromegaly.     Vascular: No JVD.     Trachea: No tracheal deviation.  Cardiovascular:     Rate and Rhythm: Normal rate and regular rhythm.     Heart sounds: Normal heart sounds. No murmur heard.  No friction rub. No gallop.   Pulmonary:     Effort: Pulmonary effort is normal. No respiratory distress.     Breath sounds: Normal breath sounds. No wheezing or rales.  Chest:     Chest wall: No tenderness.  Abdominal:     General: Bowel sounds are normal. There is no distension.     Palpations: Abdomen is soft. There is no mass.     Tenderness: There is no abdominal tenderness. There is no  guarding or rebound.  Genitourinary:    Penis: Normal. No tenderness.      Testes: Normal.     Prostate: Normal.     Rectum: Normal. Guaiac result negative.  Musculoskeletal:        General: No tenderness. Normal range of motion.     Cervical back: Neck supple.  Lymphadenopathy:     Cervical: No cervical adenopathy.  Skin:    General: Skin is warm and dry.     Coloration: Skin is not pale.     Findings: No erythema or rash.  Neurological:     Mental Status: He is alert and oriented to person, place, and time.     Cranial Nerves: No cranial nerve deficit.     Motor: No abnormal muscle tone.     Coordination: Coordination normal.     Deep Tendon Reflexes: Reflexes are normal and symmetric. Reflexes normal.  Psychiatric:        Behavior: Behavior normal.        Thought Content: Thought content normal.        Judgment: Judgment normal.  Assessment & Plan:  His CAD seems to be stable. He has some BPH so he will try Flomax 0.4 mg daily. We will get fasting labs to check lipids, etc.  Gershon Crane, MD

## 2019-08-22 ENCOUNTER — Other Ambulatory Visit (INDEPENDENT_AMBULATORY_CARE_PROVIDER_SITE_OTHER): Payer: Medicare Other

## 2019-08-22 ENCOUNTER — Other Ambulatory Visit: Payer: Self-pay

## 2019-08-22 DIAGNOSIS — E785 Hyperlipidemia, unspecified: Secondary | ICD-10-CM

## 2019-08-22 DIAGNOSIS — N138 Other obstructive and reflux uropathy: Secondary | ICD-10-CM

## 2019-08-23 LAB — LIPID PANEL
Cholesterol: 128 mg/dL (ref <200)
HDL: 48 mg/dL (ref >=40)
LDL Cholesterol (Calc): 64 mg/dL (calc)
Non-HDL Cholesterol (Calc): 80 mg/dL (calc) (ref <130)
Total CHOL/HDL Ratio: 2.7 (calc) (ref <5.0)
Triglycerides: 79 mg/dL (ref <150)

## 2019-08-23 LAB — BASIC METABOLIC PANEL
BUN: 17 mg/dL (ref 7–25)
CO2: 28 mmol/L (ref 20–32)
Calcium: 9.2 mg/dL (ref 8.6–10.3)
Chloride: 103 mmol/L (ref 98–110)
Creat: 0.87 mg/dL (ref 0.70–1.18)
Glucose, Bld: 92 mg/dL (ref 65–99)
Potassium: 4.6 mmol/L (ref 3.5–5.3)
Sodium: 137 mmol/L (ref 135–146)

## 2019-08-23 LAB — TSH: TSH: 1.6 mIU/L (ref 0.40–4.50)

## 2019-08-23 LAB — CBC WITH DIFFERENTIAL/PLATELET
Absolute Monocytes: 688 cells/uL (ref 200–950)
Basophils Absolute: 62 cells/uL (ref 0–200)
Basophils Relative: 1 %
Eosinophils Absolute: 378 cells/uL (ref 15–500)
Eosinophils Relative: 6.1 %
HCT: 44.1 % (ref 38.5–50.0)
Hemoglobin: 14.6 g/dL (ref 13.2–17.1)
Lymphs Abs: 1370 cells/uL (ref 850–3900)
MCH: 30.4 pg (ref 27.0–33.0)
MCHC: 33.1 g/dL (ref 32.0–36.0)
MCV: 91.9 fL (ref 80.0–100.0)
MPV: 9.4 fL (ref 7.5–12.5)
Monocytes Relative: 11.1 %
Neutro Abs: 3701 cells/uL (ref 1500–7800)
Neutrophils Relative %: 59.7 %
Platelets: 230 10*3/uL (ref 140–400)
RBC: 4.8 10*6/uL (ref 4.20–5.80)
RDW: 13 % (ref 11.0–15.0)
Total Lymphocyte: 22.1 %
WBC: 6.2 10*3/uL (ref 3.8–10.8)

## 2019-08-23 LAB — HEPATIC FUNCTION PANEL
AG Ratio: 1.9 (calc) (ref 1.0–2.5)
ALT: 23 U/L (ref 9–46)
AST: 22 U/L (ref 10–35)
Albumin: 4.5 g/dL (ref 3.6–5.1)
Alkaline phosphatase (APISO): 84 U/L (ref 35–144)
Bilirubin, Direct: 0.2 mg/dL (ref 0.0–0.2)
Globulin: 2.4 g/dL (calc) (ref 1.9–3.7)
Indirect Bilirubin: 0.7 mg/dL (calc) (ref 0.2–1.2)
Total Bilirubin: 0.9 mg/dL (ref 0.2–1.2)
Total Protein: 6.9 g/dL (ref 6.1–8.1)

## 2019-08-23 LAB — PSA: PSA: 0.4 ng/mL (ref <=4.0)

## 2019-10-01 ENCOUNTER — Telehealth: Payer: Self-pay | Admitting: Family Medicine

## 2019-10-01 NOTE — Telephone Encounter (Signed)
Left message for patient to schedule Annual Wellness Visit.  Please schedule with Nurse Health Advisor Shannon Crews, RN at [facility]  

## 2019-10-24 NOTE — Progress Notes (Signed)
Patient Care Team: Nelwyn Salisbury, MD as PCP - General (Family Medicine) Duke Salvia, MD as Consulting Physician (Cardiology)   HPI  Jeremy [name] is a 69 y.o. male Seen in followup for syncope.   History of coronary artery disease -- distal RCA   treated with an Taxus drug-eluting stent after his first syncopal episode.. Alos proximal RCA and of his LAD.  2007  Repeat LHC 2012 with mild ISR; EF 55%  I have reviewed his catheterization with Dr. Erlinda Hong. The plan will be long term DAPT   Occasionally has chest tightness and shortness of breath early in the morning which is ameliorated by his 5-6 mile hike/walk.  Also seems to improve when he takes an inhaler.  Occurs infrequently and unpredictably.  No changes over the last year with this.  Date Cr K LDL  11/18 0.91 4.9   3/20  0.92 4.4 74  7/21 0.87 4.6 ?,< 60      Past Medical History:  Diagnosis Date  . Colon polyp 07/05/2007   TUBULAR ADENOMA  . Coronary artery disease    Cardiac catheterization 8/07: Proximal LAD 50-75%, mid LAD 60%, ostial septal perforator 80%, proximal RCA 50% and then 80-85%, distal RCA 80-90%, EF 55-60%.  PCI was performed at that time colon distal RCA treated with a Taxus drug-eluting stent.  The proximal RCA treated with a Taxus drug eluting stent.  The LAD was studied with IVUS and treated with a Taxus drug eluding stent.    . Hyperlipidemia   . Ill-defined closed fractures of upper limb   . Sinus bradycardia    sees Dr. Graciela Husbands   . Syncope     Past Surgical History:  Procedure Laterality Date  . APPENDECTOMY    . COLONOSCOPY  06-20-13   per Dr. Leone Payor, clear, repeat in 10 yrs   . CORONARY ANGIOPLASTY      Current Outpatient Medications  Medication Sig Dispense Refill  . aspirin 81 MG tablet Take 81 mg by mouth daily.      . clopidogrel (PLAVIX) 75 MG tablet TAKE 1 TABLET DAILY 90 tablet 3  . fluticasone (FLONASE) 50 MCG/ACT nasal spray Place 2 sprays into the nose as directed. 48 g 3   . rosuvastatin (CRESTOR) 20 MG tablet TAKE 1 TABLET DAILY (WILL NEED OFFICE VISIT FOR FURTHER REFILLS, THANKS) 90 tablet 3   No current facility-administered medications for this visit.    No Known Allergies  Review of Systems negative except from HPI and PMH  Physical Exam BP 116/74   Pulse (!) 59   Ht 5\' 11"  (1.803 m)   Wt 181 lb (82.1 kg)   SpO2 97%   BMI 25.24 kg/m  Well developed and nourished in no acute distress HENT normal Neck supple with JVP-  flat   Clear Regular rate and rhythm, no murmurs or gallops Abd-soft with active BS No Clubbing cyanosis edema Skin-warm and dry A & Oriented  Grossly normal sensory and motor function  ECG sinus at 58  15/08/41  Assessment and  Plan  Syncope  Coronary artery disease status post Taxus stenting RCA/LAD 2007--discussion with [facility] long term DAPT  Hyperlipidemia  Chest discomfort  Sinus bradycardia  No interval syncope.  Bradycardia seems to be asymptomatic.  Last LDL, calculated was about 60.  Continue his statins.  Chest discomfort is a little bit concerning accompanied as it is by dyspnea.  The fact that it is infrequent, not relieved by  exertion and by inhalers suggest that it may not be cardiac.  Have discussed the importance of having a low threshold for reporting any changes in symptoms.  At that point further imaging would be appropriate.

## 2019-10-25 ENCOUNTER — Other Ambulatory Visit: Payer: Self-pay

## 2019-10-25 ENCOUNTER — Encounter: Payer: Self-pay | Admitting: Internal Medicine

## 2019-10-25 ENCOUNTER — Ambulatory Visit (INDEPENDENT_AMBULATORY_CARE_PROVIDER_SITE_OTHER): Payer: Medicare Other | Admitting: Internal Medicine

## 2019-10-25 VITALS — BP 116/74 | HR 59 | Ht 71.0 in | Wt 181.0 lb

## 2019-10-25 DIAGNOSIS — R55 Syncope and collapse: Secondary | ICD-10-CM

## 2019-10-25 NOTE — Patient Instructions (Signed)

## 2019-10-29 ENCOUNTER — Encounter: Payer: Self-pay | Admitting: Family Medicine

## 2019-11-01 NOTE — Telephone Encounter (Signed)
He can get the flu shot here but not the Covid vaccine (Isuggest his pharmacy for that). I also suggest he wait 2 weeks between these shots

## 2019-11-05 ENCOUNTER — Ambulatory Visit: Payer: Medicare Other | Attending: Internal Medicine

## 2019-11-05 DIAGNOSIS — Z23 Encounter for immunization: Secondary | ICD-10-CM

## 2019-11-05 NOTE — Progress Notes (Signed)
   Covid-19 Vaccination Clinic  Name:  RYO KLANG    MRN: [id number] DOB: 04-20-1950  11/05/2019  Mr. Billups was observed post Covid-19 immunization for 15 minutes without incident. He was provided with Vaccine Information Sheet and instruction to access the V-Safe system.   Mr. Nez was instructed to call 911 with any severe reactions post vaccine: Marland Kitchen Difficulty breathing  . Swelling of face and throat  . A fast heartbeat  . A bad rash all over body  . Dizziness and weakness

## 2020-02-19 ENCOUNTER — Telehealth: Payer: Self-pay | Admitting: Family Medicine

## 2020-02-19 NOTE — Telephone Encounter (Signed)
Left message for patient to call back and schedule Medicare Annual Wellness Visit (AWV) either virtually or in office.   Last AWV 04/20/2018  please schedule at anytime with [facility] Nurse Health Advisor 1 or 2   This should be a 45 minute visit.

## 2020-04-13 ENCOUNTER — Other Ambulatory Visit: Payer: Self-pay | Admitting: Family Medicine

## 2020-05-06 NOTE — Progress Notes (Signed)
Subjective:   Jeremy [name] is a 70 y.o. male who presents for an Initial Medicare Annual Wellness Visit Virtual Visit via Video Note  I connected with Jeremy [name] by a video enabled telemedicine application and verified that I am speaking with the correct person using two identifiers.  Location: Patient: Home Provider: Office Persons participating in the virtual visit: patient, provider   I discussed the limitations of evaluation and management by telemedicine and the availability of in person appointments. The patient expressed understanding and agreed to proceed.     March Rummage ,LPN    Review of Systems    n/a Cardiac Risk Factors include: advanced age (>17men, >17 women);male gender;dyslipidemia     Objective:    Today's Vitals   There is no height or weight on file to calculate BMI.  Advanced Directives 04/02/2018  Does Patient Have a Medical Advance Directive? Yes  Type of Estate agent of [place];Living will  Does patient want to make changes to medical advance directive? No - Patient declined  Copy of Healthcare Power of Attorney in Chart? No - copy requested    Current Medications (verified) Outpatient Encounter Medications as of 05/07/2020  Medication Sig  . aspirin 81 MG tablet Take 81 mg by mouth daily.  . clopidogrel (PLAVIX) 75 MG tablet TAKE 1 TABLET DAILY  . fluticasone (FLONASE) 50 MCG/ACT nasal spray Place 2 sprays into the nose as directed.  . rosuvastatin (CRESTOR) 20 MG tablet TAKE 1 TABLET DAILY (WILL NEED OFFICE VISIT FOR FURTHER REFILLS, THANKS)   No facility-administered encounter medications on file as of 05/07/2020.    Allergies (verified) Patient has no known allergies.   History: Past Medical History:  Diagnosis Date  . Colon polyp 07/05/2007   TUBULAR ADENOMA  . Coronary artery disease    Cardiac catheterization 8/07: Proximal LAD 50-75%, mid LAD 60%, ostial septal perforator 80%, proximal RCA 50% and then  80-85%, distal RCA 80-90%, EF 55-60%.  PCI was performed at that time colon distal RCA treated with a Taxus drug-eluting stent.  The proximal RCA treated with a Taxus drug eluting stent.  The LAD was studied with IVUS and treated with a Taxus drug eluding stent.    . Hyperlipidemia   . Ill-defined closed fractures of upper limb   . Sinus bradycardia    sees Dr. Graciela Husbands   . Syncope    Past Surgical History:  Procedure Laterality Date  . APPENDECTOMY    . COLONOSCOPY  06-20-13   per Dr. Leone Payor, clear, repeat in 10 yrs   . CORONARY ANGIOPLASTY     Family History  Problem Relation Age of Onset  . Prostate cancer Father   . Cancer Father        prostate cancer w/ mets  . Heart disease Father        CAD  . Hyperlipidemia Father   . Stroke Mother   . Colon cancer Maternal Uncle 60  . Heart attack Paternal Grandmother   . COPD Neg Hx   . Diabetes Neg Hx    Social History   Socioeconomic History  . Marital status: Married    Spouse name: Not on file  . Number of children: 0  . Years of education: 40  . Highest education level: Not on file  Occupational History  . Occupation: Therapist, sports: [employer]    Comment: Full time  Tobacco Use  . Smoking status: Former Games developer  . Smokeless tobacco: Never  Used  Vaping Use  . Vaping Use: Never used  Substance and Sexual Activity  . Alcohol use: Yes    Alcohol/week: 0.0 standard drinks    Comment: occ  . Drug use: No  . Sexual activity: Yes    Partners: Female  Other Topics Concern  . Not on file  Social History Narrative   HSG, [employer] - [employer] [place]. Married ''89. No children. Occupation: Forensic scientist      04/02/2018: Lives with wife in 3 story house   Has family scattered around the [place]    Works FT as Forensic scientist, enjoys working still   Goes to gym 3days/week   Enjoys reading   Social Determinants of Corporate investment banker Strain: Low Risk   . Difficulty of Paying Living Expenses:  Not hard at all  Food Insecurity: No Food Insecurity  . Worried About Programme researcher, broadcasting/film/video in the Last Year: Never true  . Ran Out of Food in the Last Year: Never true  Transportation Needs: No Transportation Needs  . Lack of Transportation (Medical): No  . Lack of Transportation (Non-Medical): No  Physical Activity: Sufficiently Active  . Days of Exercise per Week: 3 days  . Minutes of Exercise per Session: 50 min  Stress: No Stress Concern Present  . Feeling of Stress : Not at all  Social Connections: Moderately Integrated  . Frequency of Communication with Friends and Family: More than three times a week  . Frequency of Social Gatherings with Friends and Family: More than three times a week  . Attends Religious Services: Never  . Active Member of Clubs or Organizations: Yes  . Attends Banker Meetings: More than 4 times per year  . Marital Status: Married    Tobacco Counseling Counseling given: Not Answered   Clinical Intake:  Pre-visit preparation completed: Yes  Pain : No/denies pain     Nutritional Risks: None Diabetes: No  How often do you need to have someone help you when you read instructions, pamphlets, or other written materials from your doctor or pharmacy?: 1 - Never What is the last grade level you completed in school?: college  Diabetic?no  Interpreter Needed?: No  Information entered by :: L.Kaylynn Chamblin,Lpn   Activities of Daily Living In your present state of health, do you have any difficulty performing the following activities: 05/07/2020  Hearing? N  Vision? N  Difficulty concentrating or making decisions? N  Walking or climbing stairs? N  Dressing or bathing? N  Doing errands, shopping? N  Preparing Food and eating ? N  Using the Toilet? N  In the past six months, have you accidently leaked urine? N  Do you have problems with loss of bowel control? N  Managing your Medications? N  Managing your Finances? N  Housekeeping or managing  your Housekeeping? N  Some recent data might be hidden    Patient Care Team: Nelwyn Salisbury, MD as PCP - General (Family Medicine) Duke Salvia, MD as Consulting Physician (Cardiology)  Indicate any recent Medical Services you Jeremy have received from other than [facility] providers in the past year (date Jeremy be approximate).     Assessment:   This is a routine wellness examination for [place].  Hearing/Vision screen  Hearing Screening   125Hz  250Hz  500Hz  1000Hz  2000Hz  3000Hz  4000Hz  6000Hz  8000Hz   Right ear:           Left ear:  Comments: Annual eye exams wears glasses   Dietary issues and exercise activities discussed: Current Exercise Habits: Home exercise routine, Type of exercise: walking, Time (Minutes): 45, Frequency (Times/Week): 3, Weekly Exercise (Minutes/Week): 135, Intensity: Mild, Exercise limited by: None identified  Goals    . Exercise 3x per week (30 min per time)    . Patient Stated     Keep working for another year? Stay physically and mentally active!      Depression Screen PHQ 2/9 Scores 05/07/2020 04/02/2018 04/02/2018 12/01/2016 10/29/2015 10/08/2014  PHQ - 2 Score 0 0 0 0 0 0  PHQ- 9 Score - 0 - - - -    Fall Risk Fall Risk  05/07/2020 04/02/2018 04/02/2018 12/01/2016 10/29/2015  Falls in the past year? 0 0 0 No No  Number falls in past yr: 0 - - - -  Injury with Fall? 0 - - - -  Risk for fall due to : - History of fall(s);Impaired vision - - -  Follow up - Falls prevention discussed - - -    FALL RISK PREVENTION PERTAINING TO THE HOME:  Any stairs in or around the home? Yes  If so, are there any without handrails? Yes  Home free of loose throw rugs in walkways, pet beds, electrical cords, etc? Yes  Adequate lighting in your home to reduce risk of falls? Yes   ASSISTIVE DEVICES UTILIZED TO PREVENT FALLS:  Life alert? No  Use of a cane, walker or w/c? No  Grab bars in the bathroom? Yes  Shower chair or bench in shower? No  Elevated toilet seat or a  handicapped toilet? No   Cognitive Function: Normal cognitive status assessed by direct observation by this Nurse Health Advisor. No abnormalities found.          Immunizations Immunization History  Administered Date(s) Administered  . Influenza Whole 03/03/2009  . Influenza, High Dose Seasonal PF 10/08/2014, 10/29/2015, 11/14/2016  . Influenza, Seasonal, Injecte, Preservative Fre 02/20/2012  . Influenza,inj,Quad PF,6+ Mos 02/18/2014, 11/23/2017, 11/27/2018  . PFIZER(Purple Top)SARS-COV-2 Vaccination 11/05/2019  . Pneumococcal Conjugate-13 04/18/2013  . Pneumococcal Polysaccharide-23 04/20/2011  . Td 03/03/2009  . Zoster 04/20/2011    TDAP status: Due, Education has been provided regarding the importance of this vaccine. Advised Jeremy receive this vaccine at local pharmacy or Health Dept. Aware to provide a copy of the vaccination record if obtained from local pharmacy or Health Dept. Verbalized acceptance and understanding.  Flu Vaccine status: Due, Education has been provided regarding the importance of this vaccine. Advised Jeremy receive this vaccine at local pharmacy or Health Dept. Aware to provide a copy of the vaccination record if obtained from local pharmacy or Health Dept. Verbalized acceptance and understanding.  Pneumococcal vaccine status: Up to date  Covid-19 vaccine status: Completed vaccines  Qualifies for Shingles Vaccine? Yes   Zostavax completed Yes   Shingrix Completed?: No.    Education has been provided regarding the importance of this vaccine. Patient has been advised to call insurance company to determine out of pocket expense if they have not yet received this vaccine. Advised Jeremy also receive vaccine at local pharmacy or Health Dept. Verbalized acceptance and understanding.  Screening Tests Health Maintenance  Topic Date Due  . PNA vac Low Risk Adult (2 of 2 - PPSV23) 04/19/2016  . TETANUS/TDAP  03/04/2019  . COVID-19 Vaccine (2 - Pfizer 3-dose series)  11/26/2019  . INFLUENZA VACCINE  08/31/2020  . COLONOSCOPY (Pts [id number] Insurance coverage will need to  be confirmed)  06/21/2023  . Hepatitis C Screening  Completed  . HPV VACCINES  Aged Out    Health Maintenance  Health Maintenance Due  Topic Date Due  . PNA vac Low Risk Adult (2 of 2 - PPSV23) 04/19/2016  . TETANUS/TDAP  03/04/2019  . COVID-19 Vaccine (2 - Pfizer 3-dose series) 11/26/2019    Colorectal cancer screening: Type of screening: Colonoscopy. Completed 06/20/2013. Repeat every 10 years  Lung Cancer Screening: (Low Dose CT Chest recommended if Age 67-80 years, 30 pack-year currently smoking OR have quit w/in 15years.) does not qualify.   Lung Cancer Screening Referral: N/A  Additional Screening:  Hepatitis C Screening: does qualify  Vision Screening: Recommended annual ophthalmology exams for early detection of glaucoma and other disorders of the eye. Is the patient up to date with their annual eye exam?  Yes  Who is the provider or what is the name of the office in which the patient attends annual eye exams? Dr.Stonesiffer If pt is not established with a provider, would they like to be referred to a provider to establish care? No .   Dental Screening: Recommended annual dental exams for proper oral hygiene  Community Resource Referral / Chronic Care Management: CRR required this visit?  No   CCM required this visit?  No      Plan:     I have personally reviewed and noted the following in the patient's chart:   . Medical and social history . Use of alcohol, tobacco or illicit drugs  . Current medications and supplements . Functional ability and status . Nutritional status . Physical activity . Advanced directives . List of other physicians . Hospitalizations, surgeries, and ER visits in previous 12 months . Vitals . Screenings to include cognitive, depression, and falls . Referrals and appointments  In addition, I have reviewed and discussed with  patient certain preventive protocols, quality metrics, and best practice recommendations. A written personalized care plan for preventive services as well as general preventive health recommendations were provided to patient.     March Rummage, LPN   04/02/2023   Nurse Notes:none

## 2020-05-07 ENCOUNTER — Ambulatory Visit (INDEPENDENT_AMBULATORY_CARE_PROVIDER_SITE_OTHER): Payer: Medicare HMO

## 2020-05-07 DIAGNOSIS — Z0000 Encounter for general adult medical examination without abnormal findings: Secondary | ICD-10-CM

## 2020-05-07 NOTE — Patient Instructions (Signed)
Jeremy [name] , Thank you for taking time to come for your Medicare Wellness Visit. I appreciate your ongoing commitment to your health goals. Please review the following plan we discussed and let me know if I can assist you in the future.   Screening recommendations/referrals: Colonoscopy: current 06/21/2023 Recommended yearly ophthalmology/optometry visit for glaucoma screening and checkup Recommended yearly dental visit for hygiene and checkup  Vaccinations: Influenza vaccine: current Due Fall 2022 Pneumococcal vaccine: Completed the series  Tdap vaccine: due upon injury  Shingles vaccine: will check local pharmacy    Advanced directives: will provide copies   Conditions/risks identified: none   Next appointment: pt will schedule CPE after 08/15/2020  With Dr. Clent Ridges   Preventive Care 65 Years and Older, Male Preventive care refers to lifestyle choices and visits with your health care provider that can promote health and wellness. What does preventive care include?  A yearly physical exam. This is also called an annual well check.  Dental exams once or twice a year.  Routine eye exams. Ask your health care provider how often you should have your eyes checked.  Personal lifestyle choices, including:  Daily care of your teeth and gums.  Regular physical activity.  Eating a healthy diet.  Avoiding tobacco and drug use.  Limiting alcohol use.  Practicing safe sex.  Taking low doses of aspirin every day.  Taking vitamin and mineral supplements as recommended by your health care provider. What happens during an annual well check? The services and screenings done by your health care provider during your annual well check will depend on your age, overall health, lifestyle risk factors, and family history of disease. Counseling  Your health care provider may ask you questions about your:  Alcohol use.  Tobacco use.  Drug use.  Emotional well-being.  Home and relationship  well-being.  Sexual activity.  Eating habits.  History of falls.  Memory and ability to understand (cognition).  Work and work Astronomer. Screening  You may have the following tests or measurements:  Height, weight, and BMI.  Blood pressure.  Lipid and cholesterol levels. These may be checked every 5 years, or more frequently if you are over 8 years old.  Skin check.  Lung cancer screening. You may have this screening every year starting at age 45 if you have a 30-pack-year history of smoking and currently smoke or have quit within the past 15 years.  Fecal occult blood test (FOBT) of the stool. You may have this test every year starting at age 88.  Flexible sigmoidoscopy or colonoscopy. You may have a sigmoidoscopy every 5 years or a colonoscopy every 10 years starting at age 49.  Prostate cancer screening. Recommendations will vary depending on your family history and other risks.  Hepatitis C blood test.  Hepatitis B blood test.  Sexually transmitted disease (STD) testing.  Diabetes screening. This is done by checking your blood sugar (glucose) after you have not eaten for a while (fasting). You may have this done every 1-3 years.  Abdominal aortic aneurysm (AAA) screening. You may need this if you are a current or former smoker.  Osteoporosis. You may be screened starting at age 23 if you are at high risk. Talk with your health care provider about your test results, treatment options, and if necessary, the need for more tests. Vaccines  Your health care provider may recommend certain vaccines, such as:  Influenza vaccine. This is recommended every year.  Tetanus, diphtheria, and acellular pertussis (Tdap, Td) vaccine.  You may need a Td booster every 10 years.  Zoster vaccine. You may need this after age 85.  Pneumococcal 13-valent conjugate (PCV13) vaccine. One dose is recommended after age 75.  Pneumococcal polysaccharide (PPSV23) vaccine. One dose is  recommended after age 44. Talk to your health care provider about which screenings and vaccines you need and how often you need them. This information is not intended to replace advice given to you by your health care provider. Make sure you discuss any questions you have with your health care provider. Document Released: 02/13/2015 Document Revised: 10/07/2015 Document Reviewed: 11/18/2014 Elsevier Interactive Patient Education  2017 [place] Prevention in the Home Falls can cause injuries. They can happen to people of all ages. There are many things you can do to make your home safe and to help prevent falls. What can I do on the outside of my home?  Regularly fix the edges of walkways and driveways and fix any cracks.  Remove anything that might make you trip as you walk through a door, such as a raised step or threshold.  Trim any bushes or trees on the path to your home.  Use bright outdoor lighting.  Clear any walking paths of anything that might make someone trip, such as rocks or tools.  Regularly check to see if handrails are loose or broken. Make sure that both sides of any steps have handrails.  Any raised decks and porches should have guardrails on the edges.  Have any leaves, snow, or ice cleared regularly.  Use sand or salt on walking paths during winter.  Clean up any spills in your garage right away. This includes oil or grease spills. What can I do in the bathroom?  Use night lights.  Install grab bars by the toilet and in the tub and shower. Do not use towel bars as grab bars.  Use non-skid mats or decals in the tub or shower.  If you need to sit down in the shower, use a plastic, non-slip stool.  Keep the floor dry. Clean up any water that spills on the floor as soon as it happens.  Remove soap buildup in the tub or shower regularly.  Attach bath mats securely with double-sided non-slip rug tape.  Do not have throw rugs and other things on  the floor that can make you trip. What can I do in the bedroom?  Use night lights.  Make sure that you have a light by your bed that is easy to reach.  Do not use any sheets or blankets that are too big for your bed. They should not hang down onto the floor.  Have a firm chair that has side arms. You can use this for support while you get dressed.  Do not have throw rugs and other things on the floor that can make you trip. What can I do in the kitchen?  Clean up any spills right away.  Avoid walking on wet floors.  Keep items that you use a lot in easy-to-reach places.  If you need to reach something above you, use a strong step stool that has a grab bar.  Keep electrical cords out of the way.  Do not use floor polish or wax that makes floors slippery. If you must use wax, use non-skid floor wax.  Do not have throw rugs and other things on the floor that can make you trip. What can I do with my stairs?  Do not leave any  items on the stairs.  Make sure that there are handrails on both sides of the stairs and use them. Fix handrails that are broken or loose. Make sure that handrails are as long as the stairways.  Check any carpeting to make sure that it is firmly attached to the stairs. Fix any carpet that is loose or worn.  Avoid having throw rugs at the top or bottom of the stairs. If you do have throw rugs, attach them to the floor with carpet tape.  Make sure that you have a light switch at the top of the stairs and the bottom of the stairs. If you do not have them, ask someone to add them for you. What else can I do to help prevent falls?  Wear shoes that:  Do not have high heels.  Have rubber bottoms.  Are comfortable and fit you well.  Are closed at the toe. Do not wear sandals.  If you use a stepladder:  Make sure that it is fully opened. Do not climb a closed stepladder.  Make sure that both sides of the stepladder are locked into place.  Ask someone to  hold it for you, if possible.  Clearly mark and make sure that you can see:  Any grab bars or handrails.  First and last steps.  Where the edge of each step is.  Use tools that help you move around (mobility aids) if they are needed. These include:  Canes.  Walkers.  Scooters.  Crutches.  Turn on the lights when you go into a dark area. Replace any light bulbs as soon as they burn out.  Set up your furniture so you have a clear path. Avoid moving your furniture around.  If any of your floors are uneven, fix them.  If there are any pets around you, be aware of where they are.  Review your medicines with your doctor. Some medicines can make you feel dizzy. This can increase your chance of falling. Ask your doctor what other things that you can do to help prevent falls. This information is not intended to replace advice given to you by your health care provider. Make sure you discuss any questions you have with your health care provider. Document Released: 11/13/2008 Document Revised: 06/25/2015 Document Reviewed: 02/21/2014 Elsevier Interactive Patient Education  2017 [employer].

## 2020-05-12 IMAGING — MR MR LUMBAR SPINE W/O CM
4 of 5 series · 25 of 48 positions shown · non-contrast
Comparison: None available.

CLINICAL DATA: Initial evaluation for left-sided pain extending
into the buttock in posterior thigh to knee for 2 weeks.

EXAM:
MRI LUMBAR SPINE WITHOUT CONTRAST
TECHNIQUE: Multiplanar, multisequence MR imaging of the lumbar spine was
performed. No intravenous contrast was administered.

[Series 3: T2 · sagittal · 4.0mm · 0.55mm/px · 6 of 16 slices shown (1 of 2)]
[im 1/16]
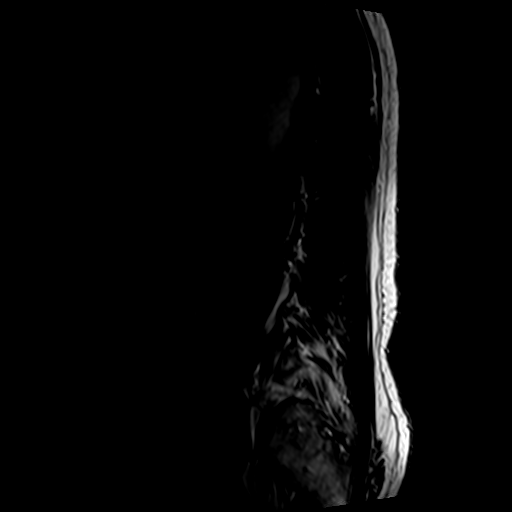
[im 4/16]
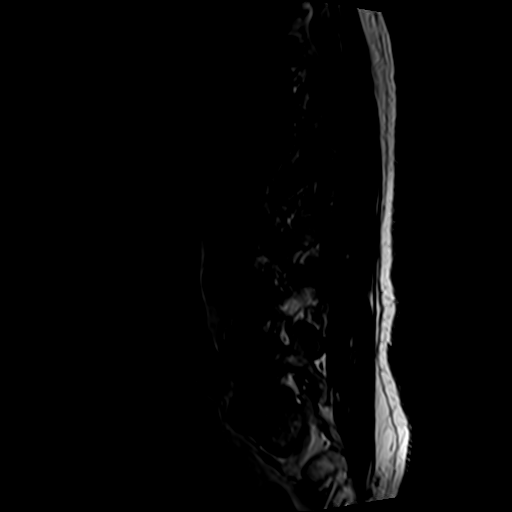
[im 7/16]
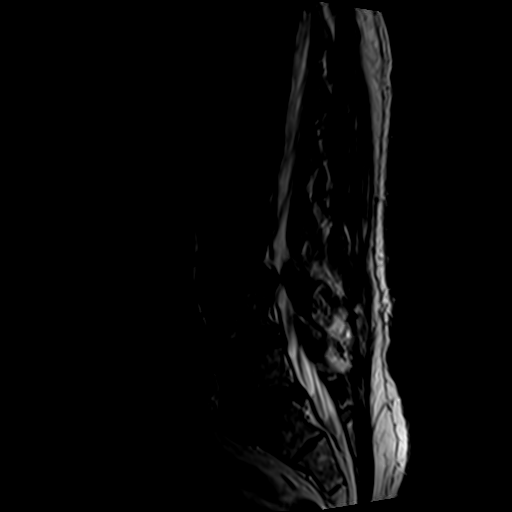
[im 10/16]
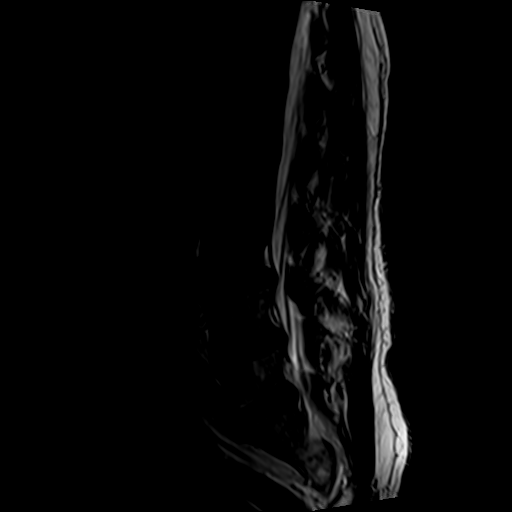
[im 13/16]
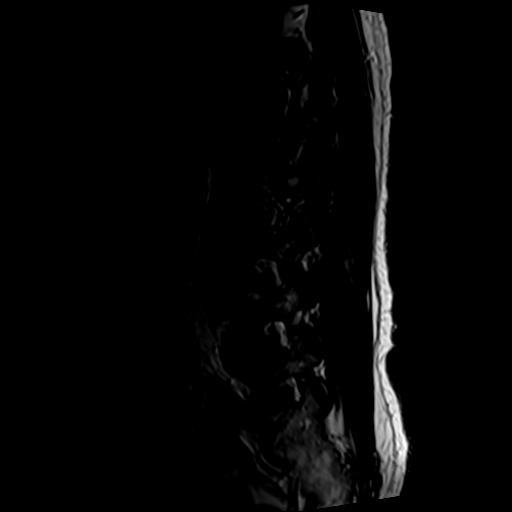
[im 16/16]
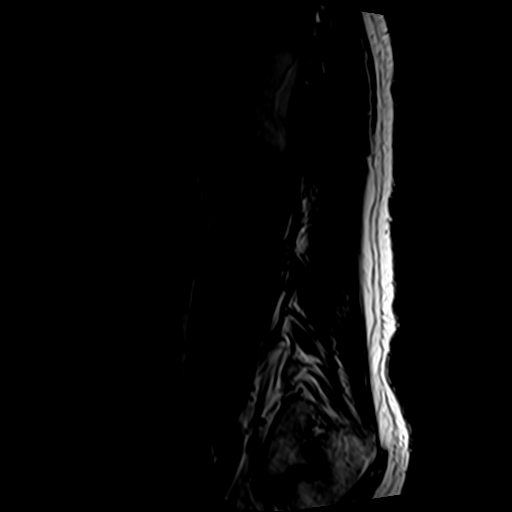

[Series 4: T1 · sagittal · 4.0mm · 0.55mm/px · 6 of 16 slices shown (1 of 2)]
[im 1/16]
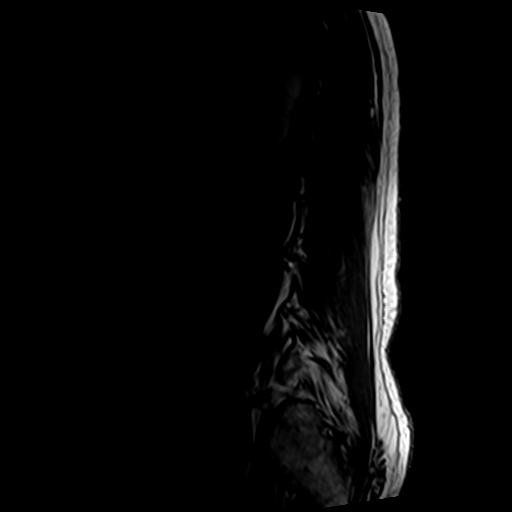
[im 4/16]
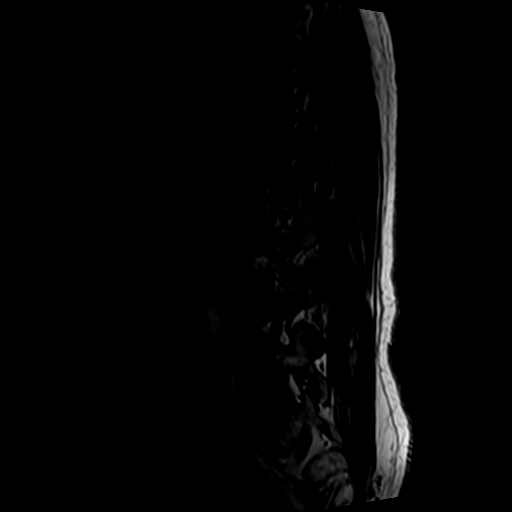
[im 7/16]
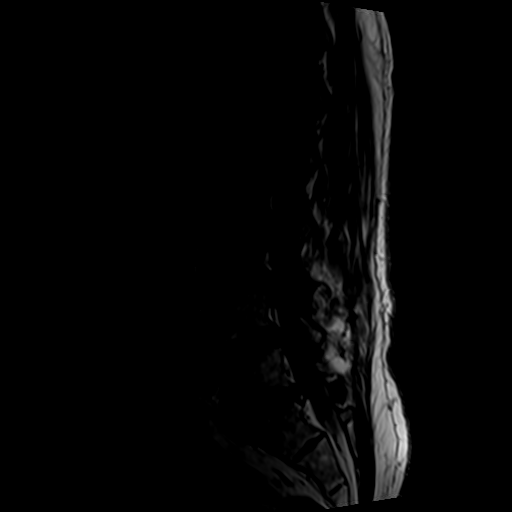
[im 10/16]
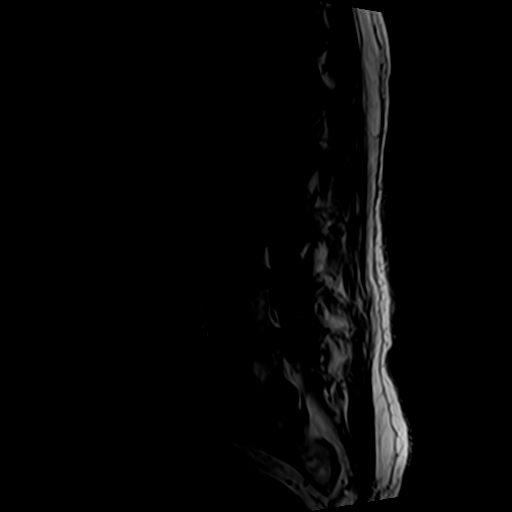
[im 13/16]
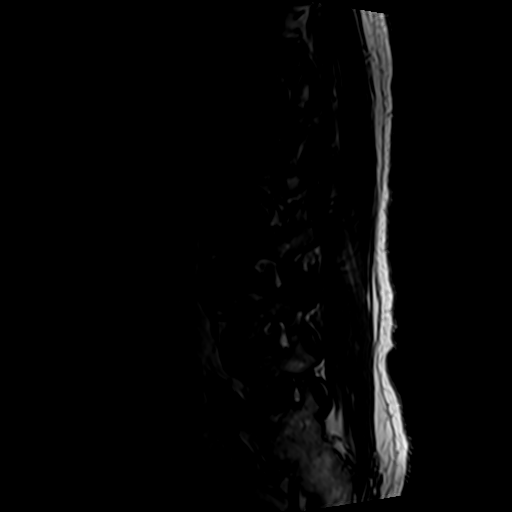
[im 16/16]
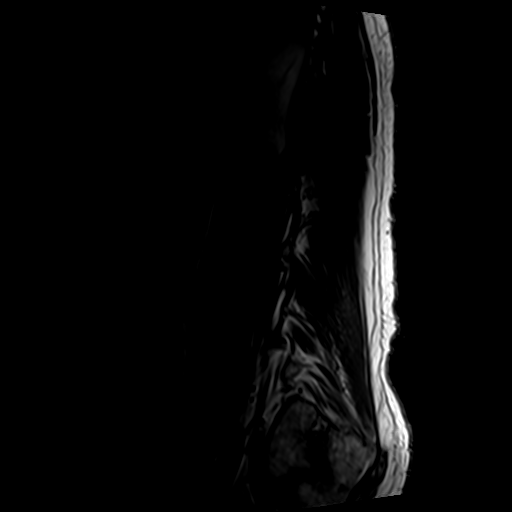

[Series 6: T2 · axial · 4.0mm · 0.70mm/px · z∈[-94,+118]mm · 9 of 40 slices shown (2 of 2)]
[im 1/40]
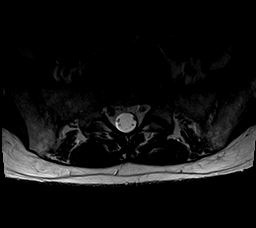
[im 6/40]
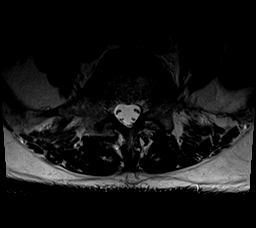
[im 12/40]
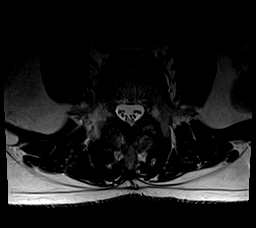
[im 17/40]
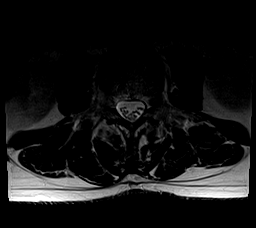
[im 20/40]
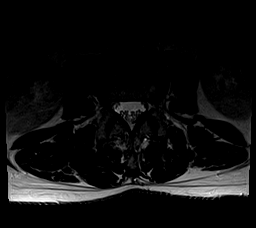
[im 23/40]
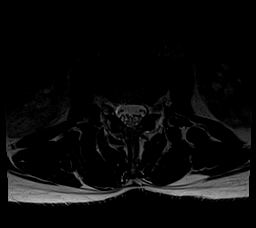
[im 28/40]
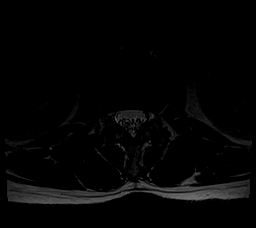
[im 34/40]
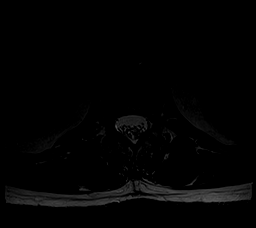
[im 40/40]
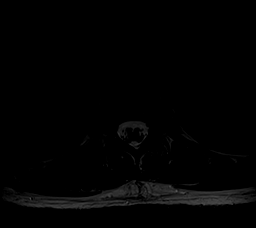

[Series 8: T1 · axial · 4.0mm · 0.35mm/px · z∈[-94,+88]mm · 4 of 40 slices shown (2 of 2)]
[im 1/40]
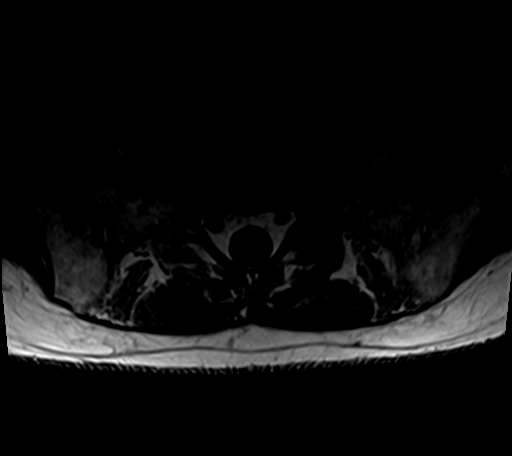
[im 6/40]
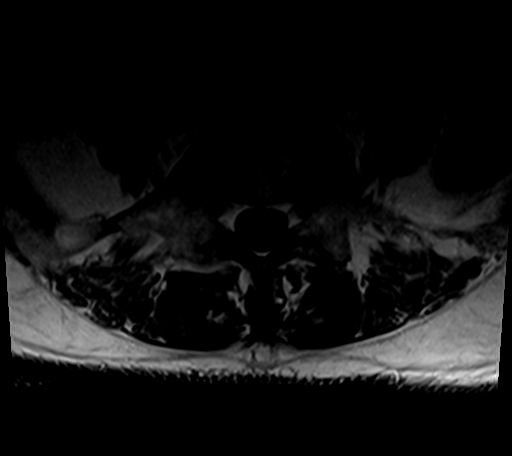
[im 20/40]
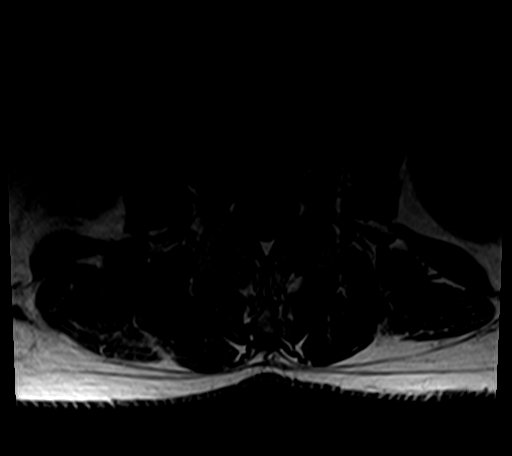
[im 34/40]
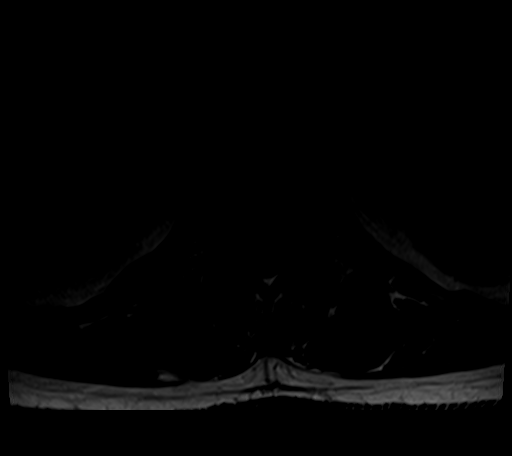

[25 of 48 positions shown; findings below may reference images not displayed]

FINDINGS: Segmentation: Standard. Lowest well-formed disc space labeled the
L5-S1 level.

Alignment: Straightening of the normal lumbar lordosis. 3 mm
anterolisthesis of L3 on L4, chronic and facet mediated.

Vertebrae: Vertebral body height maintained without evidence for
acute or chronic fracture. Bone marrow signal intensity diffusely
heterogeneous but within normal limits. No discrete or worrisome
osseous lesions. Reactive marrow edema present about the L3-4 facets
due to facet arthritis. No other abnormal marrow edema.

Conus medullaris and cauda equina: Conus extends to the T12-L1
level. Conus and cauda equina appear normal.

Paraspinal and other soft tissues: Unremarkable.

Disc levels:

L1-2:  Unremarkable.

L2-3:  Unremarkable.

L3-4: 3 mm anterolisthesis. Diffuse disc bulge with disc
desiccation. Moderate facet and ligament flavum hypertrophy.
Resultant mild canal with bilateral lateral recess stenosis. Mild to
moderate bilateral L3 foraminal narrowing.

L4-5: Mild disc bulge with disc desiccation. Small posterior annular
fissure. Mild bilateral facet hypertrophy. Borderline mild left
lateral recess narrowing without significant spinal stenosis. Mild
bilateral L4 foraminal narrowing.

L5-S1: Chronic intervertebral disc space narrowing with diffuse disc
bulge and disc desiccation. Broad-based right subarticular disc
protrusion extends into the right lateral recess, contacting and
mildly displacing the descending right S1 nerve root. Associated
annular fissure. There is an additional left extraforaminal disc
protrusion that contacts the exiting left L5 nerve root as it
courses of the left neural foramen (series 8, image 38). Finding
could contribute to left lower extremity radicular symptoms. Central
canal remains patent. Moderate left with mild right L5 foraminal
stenosis.
IMPRESSION: 1. Left extraforaminal disc protrusion at L5-S1, contacting the
exiting left L5 nerve root as it courses out of the left neural
foramen. Finding could contribute to left lower extremity radicular
symptoms.
2. Additional broad right subarticular disc protrusion at L5-S1,
contacting and mildly displacing the descending right S1 nerve root.
3. Disc bulging with facet hypertrophy at L3-4 with resultant mild
canal, with mild to moderate bilateral L3 foraminal stenosis.
4. Reactive marrow edema about the bilateral L3-4 facets, which
could contribute to underlying back pain.

## 2020-07-01 ENCOUNTER — Telehealth: Payer: Self-pay | Admitting: Family Medicine

## 2020-07-01 MED ORDER — CLOPIDOGREL BISULFATE 75 MG PO TABS: 1.0000 | ORAL_TABLET | Freq: Every day | ORAL | 0 refills | Status: DC

## 2020-07-01 MED ORDER — ROSUVASTATIN CALCIUM 20 MG PO TABS: ORAL_TABLET | ORAL | 0 refills | Status: DC

## 2020-07-01 NOTE — Telephone Encounter (Signed)
Rx's sent in. °

## 2020-07-01 NOTE — Telephone Encounter (Signed)
Patient is calling and requesting a refill for clopidogrel (PLAVIX) 75 MG tablet and rosuvastatin (CRESTOR) 20 MG tablet to be sent to [facility] [id number], please advise. CB is [phone]

## 2020-08-17 ENCOUNTER — Ambulatory Visit: Payer: Medicare HMO | Admitting: Family Medicine

## 2020-08-24 ENCOUNTER — Other Ambulatory Visit: Payer: Self-pay

## 2020-08-25 ENCOUNTER — Encounter: Payer: Self-pay | Admitting: Family Medicine

## 2020-08-25 ENCOUNTER — Ambulatory Visit (INDEPENDENT_AMBULATORY_CARE_PROVIDER_SITE_OTHER): Payer: Medicare HMO | Admitting: Family Medicine

## 2020-08-25 VITALS — BP 112/62 | HR 54 | Temp 98.1°F | Ht 71.0 in | Wt 182.4 lb

## 2020-08-25 DIAGNOSIS — Z0000 Encounter for general adult medical examination without abnormal findings: Secondary | ICD-10-CM

## 2020-08-25 NOTE — Progress Notes (Signed)
   Subjective:    Patient ID: Jeremy [name], male    DOB: 02/16/1950, 70 y.o.   MRN: [id number]  HPI Here for a well exam. He feels great.    Review of Systems  Constitutional: Negative.   HENT: Negative.    Eyes: Negative.   Respiratory: Negative.    Cardiovascular: Negative.   Gastrointestinal: Negative.   Genitourinary: Negative.   Musculoskeletal: Negative.   Skin: Negative.   Neurological: Negative.   Psychiatric/Behavioral: Negative.        Objective:   Physical Exam Constitutional:      General: He is not in acute distress.    Appearance: Normal appearance. He is well-developed. He is not diaphoretic.  HENT:     Head: Normocephalic and atraumatic.     Right Ear: External ear normal.     Left Ear: External ear normal.     Nose: Nose normal.     Mouth/Throat:     Pharynx: No oropharyngeal exudate.  Eyes:     General: No scleral icterus.       Right eye: No discharge.        Left eye: No discharge.     Conjunctiva/sclera: Conjunctivae normal.     Pupils: Pupils are equal, round, and reactive to light.  Neck:     Thyroid: No thyromegaly.     Vascular: No JVD.     Trachea: No tracheal deviation.  Cardiovascular:     Rate and Rhythm: Normal rate and regular rhythm.     Heart sounds: Normal heart sounds. No murmur heard.   No friction rub. No gallop.  Pulmonary:     Effort: Pulmonary effort is normal. No respiratory distress.     Breath sounds: Normal breath sounds. No wheezing or rales.  Chest:     Chest wall: No tenderness.  Abdominal:     General: Bowel sounds are normal. There is no distension.     Palpations: Abdomen is soft. There is no mass.     Tenderness: There is no abdominal tenderness. There is no guarding or rebound.  Genitourinary:    Penis: Normal. No tenderness.      Testes: Normal.     Prostate: Normal.     Rectum: Normal. Guaiac result negative.  Musculoskeletal:        General: No tenderness. Normal range of motion.     Cervical back:  Neck supple.  Lymphadenopathy:     Cervical: No cervical adenopathy.  Skin:    General: Skin is warm and dry.     Coloration: Skin is not pale.     Findings: No erythema or rash.  Neurological:     Mental Status: He is alert and oriented to person, place, and time.     Cranial Nerves: No cranial nerve deficit.     Motor: No abnormal muscle tone.     Coordination: Coordination normal.     Deep Tendon Reflexes: Reflexes are normal and symmetric. Reflexes normal.  Psychiatric:        Behavior: Behavior normal.        Thought Content: Thought content normal.        Judgment: Judgment normal.          Assessment & Plan:  Well exam. We discussed diet and exercise. Get fasting labs soon.  Gershon Crane, MD

## 2020-08-28 ENCOUNTER — Other Ambulatory Visit (INDEPENDENT_AMBULATORY_CARE_PROVIDER_SITE_OTHER): Payer: Medicare HMO

## 2020-08-28 ENCOUNTER — Other Ambulatory Visit: Payer: Self-pay

## 2020-08-28 DIAGNOSIS — Z0000 Encounter for general adult medical examination without abnormal findings: Secondary | ICD-10-CM

## 2020-08-28 LAB — HEPATIC FUNCTION PANEL
ALT: 28 U/L (ref 0–53)
AST: 25 U/L (ref 0–37)
Albumin: 4.2 g/dL (ref 3.5–5.2)
Alkaline Phosphatase: 68 U/L (ref 39–117)
Bilirubin, Direct: 0.1 mg/dL (ref 0.0–0.3)
Total Bilirubin: 0.6 mg/dL (ref 0.2–1.2)
Total Protein: 6.6 g/dL (ref 6.0–8.3)

## 2020-08-28 LAB — TSH: TSH: 2.24 u[IU]/mL (ref 0.35–5.50)

## 2020-08-28 LAB — HEMOGLOBIN A1C: Hgb A1c MFr Bld: 5.9 % (ref 4.6–6.5)

## 2020-08-28 LAB — BASIC METABOLIC PANEL
BUN: 21 mg/dL (ref 6–23)
CO2: 31 mEq/L (ref 19–32)
Calcium: 9 mg/dL (ref 8.4–10.5)
Chloride: 104 mEq/L (ref 96–112)
Creatinine, Ser: 0.91 mg/dL (ref 0.40–1.50)
GFR: 84.92 mL/min (ref >60.00)
Glucose, Bld: 83 mg/dL (ref 70–99)
Potassium: 4.7 mEq/L (ref 3.5–5.1)
Sodium: 140 mEq/L (ref 135–145)

## 2020-08-28 LAB — CBC WITH DIFFERENTIAL/PLATELET
Basophils Absolute: 0 10*3/uL (ref 0.0–0.1)
Basophils Relative: 0.9 % (ref 0.0–3.0)
Eosinophils Absolute: 0.3 10*3/uL (ref 0.0–0.7)
Eosinophils Relative: 5.8 % — ABNORMAL HIGH (ref 0.0–5.0)
HCT: 43 % (ref 39.0–52.0)
Hemoglobin: 14.3 g/dL (ref 13.0–17.0)
Lymphocytes Relative: 27.5 % (ref 12.0–46.0)
Lymphs Abs: 1.4 10*3/uL (ref 0.7–4.0)
MCHC: 33.4 g/dL (ref 30.0–36.0)
MCV: 91.9 fl (ref 78.0–100.0)
Monocytes Absolute: 0.6 10*3/uL (ref 0.1–1.0)
Monocytes Relative: 12.3 % — ABNORMAL HIGH (ref 3.0–12.0)
Neutro Abs: 2.8 10*3/uL (ref 1.4–7.7)
Neutrophils Relative %: 53.5 % (ref 43.0–77.0)
Platelets: 210 10*3/uL (ref 150.0–400.0)
RBC: 4.68 Mil/uL (ref 4.22–5.81)
RDW: 14.3 % (ref 11.5–15.5)
WBC: 5.3 10*3/uL (ref 4.0–10.5)

## 2020-08-28 LAB — LIPID PANEL
Cholesterol: 151 mg/dL (ref 0–200)
HDL: 45.2 mg/dL (ref >39.00)
LDL Cholesterol: 92 mg/dL (ref 0–99)
NonHDL: 106.24
Total CHOL/HDL Ratio: 3
Triglycerides: 70 mg/dL (ref 0.0–149.0)
VLDL: 14 mg/dL (ref 0.0–40.0)

## 2020-08-28 LAB — PSA: PSA: 0.32 ng/mL (ref 0.10–4.00)

## 2020-08-28 LAB — T4, FREE: Free T4: 0.61 ng/dL (ref 0.60–1.60)

## 2020-08-28 LAB — T3, FREE: T3, Free: 3.3 pg/mL (ref 2.3–4.2)

## 2020-09-30 ENCOUNTER — Telehealth: Payer: Self-pay | Admitting: Family Medicine

## 2020-09-30 ENCOUNTER — Other Ambulatory Visit: Payer: Self-pay

## 2020-09-30 MED ORDER — CLOPIDOGREL BISULFATE 75 MG PO TABS: 75.0000 mg | ORAL_TABLET | Freq: Every day | ORAL | 1 refills | Status: DC

## 2020-09-30 MED ORDER — ROSUVASTATIN CALCIUM 20 MG PO TABS: ORAL_TABLET | ORAL | 1 refills | Status: DC

## 2020-09-30 NOTE — Telephone Encounter (Signed)
Refills sent to [facility]

## 2020-09-30 NOTE — Telephone Encounter (Signed)
Patient called because he needs refill on rosuvastatin (CRESTOR) 20 MG tablet and clopidogrel (PLAVIX) 75 MG tablet  Patient has ten pills left each and it will be sent by mail.   [facility]: [phone]  Fax for them is [phone]    Please Advise

## 2020-10-26 ENCOUNTER — Other Ambulatory Visit (HOSPITAL_BASED_OUTPATIENT_CLINIC_OR_DEPARTMENT_OTHER): Payer: Self-pay

## 2020-10-26 ENCOUNTER — Ambulatory Visit: Payer: Medicare HMO | Attending: Internal Medicine

## 2020-10-26 DIAGNOSIS — Z23 Encounter for immunization: Secondary | ICD-10-CM

## 2020-10-26 MED ORDER — PFIZER COVID-19 VAC BIVALENT 30 MCG/0.3ML IM SUSP
INTRAMUSCULAR | 0 refills | Status: DC
  Filled 2020-10-26: qty 0.3, 1d supply, fill #0

## 2020-10-26 NOTE — Progress Notes (Signed)
   Covid-19 Vaccination Clinic  Name:  Jeremy [name]    MRN: [id number] DOB: 01-07-1951  10/26/2020  [name] was observed post Covid-19 immunization for 15 minutes without incident. He was provided with Vaccine Information Sheet and instruction to access the V-Safe system.   [name] was instructed to call 911 with any severe reactions post vaccine: Difficulty breathing  Swelling of face and throat  A fast heartbeat  A bad rash all over body  Dizziness and weakness

## 2020-11-11 ENCOUNTER — Other Ambulatory Visit: Payer: Self-pay

## 2020-11-11 ENCOUNTER — Ambulatory Visit (INDEPENDENT_AMBULATORY_CARE_PROVIDER_SITE_OTHER): Payer: Medicare HMO

## 2020-11-11 DIAGNOSIS — Z23 Encounter for immunization: Secondary | ICD-10-CM | POA: Diagnosis not present

## 2020-11-25 ENCOUNTER — Ambulatory Visit: Payer: Medicare HMO | Admitting: Internal Medicine

## 2020-11-25 ENCOUNTER — Other Ambulatory Visit: Payer: Self-pay

## 2020-11-25 ENCOUNTER — Encounter: Payer: Self-pay | Admitting: Internal Medicine

## 2020-11-25 VITALS — BP 130/70 | HR 52 | Ht 71.0 in | Wt 184.6 lb

## 2020-11-25 DIAGNOSIS — R001 Bradycardia, unspecified: Secondary | ICD-10-CM | POA: Diagnosis not present

## 2020-11-25 DIAGNOSIS — E785 Hyperlipidemia, unspecified: Secondary | ICD-10-CM | POA: Diagnosis not present

## 2020-11-25 DIAGNOSIS — R55 Syncope and collapse: Secondary | ICD-10-CM

## 2020-11-25 MED ORDER — EZETIMIBE 10 MG PO TABS: 10.0000 mg | ORAL_TABLET | Freq: Every day | ORAL | 3 refills | Status: DC

## 2020-11-25 NOTE — Progress Notes (Signed)
Patient Care Team: Nelwyn Salisbury, MD as PCP - General (Family Medicine) Duke Salvia, MD as Consulting Physician (Cardiology)   HPI  Jeremy [name] is a 70 y.o. male Seen in followup for syncope.   History of coronary artery disease -- distal RCA   treated with an Taxus drug-eluting stent after his first syncopal episode.. Alos proximal RCA and of his LAD.  2007  Repeat LHC 2012 with mild ISR; EF 55% age at that time was about 30  I have reviewed his catheterization with Dr. Erlinda Hong. The plan will be long term DAPT   The patient denies chest pain, shortness of breath, nocturnal dyspnea, orthopnea or peripheral edema.  There have been no palpitations, lightheadedness or syncope.    Approaching retirement may be a year from now  Date Cr K LDL  11/18 0.91 4.9   3/20  0.92 4.4 74  7/21 0.87 4.6 ?,< 60   7/22   92     Past Medical History:  Diagnosis Date   Colon polyp 07/05/2007   TUBULAR ADENOMA   Coronary artery disease    Cardiac catheterization 8/07: Proximal LAD 50-75%, mid LAD 60%, ostial septal perforator 80%, proximal RCA 50% and then 80-85%, distal RCA 80-90%, EF 55-60%.  PCI was performed at that time colon distal RCA treated with a Taxus drug-eluting stent.  The proximal RCA treated with a Taxus drug eluting stent.  The LAD was studied with IVUS and treated with a Taxus drug eluding stent.     Hyperlipidemia    Ill-defined closed fractures of upper limb    Sinus bradycardia    sees Dr. Graciela Husbands    Syncope     Past Surgical History:  Procedure Laterality Date   APPENDECTOMY     COLONOSCOPY  06-20-13   per Dr. Leone Payor, clear, repeat in 10 yrs    CORONARY ANGIOPLASTY      Current Outpatient Medications  Medication Sig Dispense Refill   aspirin 81 MG tablet Take 81 mg by mouth daily.     clopidogrel (PLAVIX) 75 MG tablet Take 1 tablet (75 mg total) by mouth daily. 90 tablet 1   COVID-19 mRNA bivalent vaccine, Pfizer, (PFIZER COVID-19 VAC BIVALENT) injection  Inject into the muscle. 0.3 mL 0   fluticasone (FLONASE) 50 MCG/ACT nasal spray Place 2 sprays into the nose as directed. 48 g 3   rosuvastatin (CRESTOR) 20 MG tablet TAKE 1 TABLET DAILY 90 tablet 1   No current facility-administered medications for this visit.    No Known Allergies  Review of Systems negative except from HPI and PMH  Physical Exam BP 130/70   Pulse (!) 52   Ht 5\' 11"  (1.803 m)   Wt 184 lb 9.6 oz (83.7 kg)   SpO2 95%   BMI 25.75 kg/m    Well developed and nourished in no acute distress HENT normal Neck supple with JVP-  6 Clear Regular rate and rhythm, no murmurs or gallops Abd-soft with active BS No Clubbing cyanosis edema Skin-warm and dry A & Oriented  Grossly normal sensory and motor function  ECG sinus at 52 Interval 16/10/42  Assessment and  Plan  Syncope  Coronary artery disease status post Taxus stenting RCA/LAD 2007--discussion with [facility] long term DAPT  Hyperlipidemia  Sinus bradycardia  No interval syncope.  Bradycardia is asymptomatic  No symptoms of angina.  With his coronary disease, very high risk, we will continue him on DAPT.  His last  LDL was far from target with an interval change from the 70s--90s.  Not withstanding, with a change in guidelines, his target is now somewhere between 64 and 70 depending on classification and with his very young age of onset I am inclined towards the former.  In any case we will add ezetimibe 10 and plan a recheck of his lipid profile in about 3 months.

## 2020-11-25 NOTE — Patient Instructions (Addendum)
Medication Instructions:  Your physician has recommended you make the following change in your medication:   ** Begin Ezetimibe 10mg  - 1 tablet by mouth daily  *If you need a refill on your cardiac medications before your next appointment, please call your pharmacy*   Lab Work: Fasting Lipi Panel in 3 months  If you have labs (blood work) drawn today and your tests are completely normal, you will receive your results only by: MyChart Message (if you have MyChart) OR A paper copy in the mail If you have any lab test that is abnormal or we need to change your treatment, we will call you to review the results.   Testing/Procedures: None ordered.    Follow-Up: At [facility], you and your health needs are our priority.  As part of our continuing mission to provide you with exceptional heart care, we have created designated Provider Care Teams.  These Care Teams include your primary Cardiologist (physician) and Advanced Practice Providers (APPs -  Physician Assistants and Nurse Practitioners) who all work together to provide you with the care you need, when you need it.  We recommend signing up for the patient portal called "MyChart".  Sign up information is provided on this After Visit Summary.  MyChart is used to connect with patients for Virtual Visits (Telemedicine).  Patients are able to view lab/test results, encounter notes, upcoming appointments, etc.  Non-urgent messages can be sent to your provider as well.   To learn more about what you can do with MyChart, go to [facility].    Your next appointment:   12 month(s)  The format for your next appointment:   In Person  Provider:   [email or web address], MD

## 2021-01-04 ENCOUNTER — Encounter: Payer: Self-pay | Admitting: Family Medicine

## 2021-01-05 ENCOUNTER — Other Ambulatory Visit: Payer: Self-pay

## 2021-01-05 MED ORDER — SILDENAFIL CITRATE 100 MG PO TABS: 100.0000 mg | ORAL_TABLET | ORAL | 5 refills | Status: DC | PRN

## 2021-01-05 NOTE — Telephone Encounter (Signed)
Call in Viagra 100 mg to take as needed, #10 with 5 rf

## 2021-01-10 ENCOUNTER — Other Ambulatory Visit: Payer: Self-pay | Admitting: Family Medicine

## 2021-01-14 ENCOUNTER — Other Ambulatory Visit: Payer: Self-pay

## 2021-01-14 MED ORDER — SILDENAFIL CITRATE 100 MG PO TABS: 100.0000 mg | ORAL_TABLET | ORAL | 5 refills | Status: AC | PRN

## 2021-02-26 ENCOUNTER — Other Ambulatory Visit: Payer: Self-pay

## 2021-02-26 ENCOUNTER — Other Ambulatory Visit: Payer: Medicare HMO | Admitting: *Deleted

## 2021-02-26 DIAGNOSIS — E785 Hyperlipidemia, unspecified: Secondary | ICD-10-CM

## 2021-02-26 LAB — LIPID PANEL
Chol/HDL Ratio: 2.3 ratio (ref 0.0–5.0)
Cholesterol, Total: 100 mg/dL (ref 100–199)
HDL: 43 mg/dL (ref >39)
LDL Chol Calc (NIH): 43 mg/dL (ref 0–99)
Triglycerides: 62 mg/dL (ref 0–149)
VLDL Cholesterol Cal: 14 mg/dL (ref 5–40)

## 2021-03-04 ENCOUNTER — Other Ambulatory Visit: Payer: Self-pay | Admitting: Family Medicine

## 2021-03-04 DIAGNOSIS — E785 Hyperlipidemia, unspecified: Secondary | ICD-10-CM

## 2021-04-12 ENCOUNTER — Ambulatory Visit (INDEPENDENT_AMBULATORY_CARE_PROVIDER_SITE_OTHER): Payer: Medicare HMO

## 2021-04-12 VITALS — Ht 70.0 in | Wt 180.0 lb

## 2021-04-12 DIAGNOSIS — Z0000 Encounter for general adult medical examination without abnormal findings: Secondary | ICD-10-CM | POA: Diagnosis not present

## 2021-04-12 NOTE — Progress Notes (Cosign Needed)
I connected with  Jeremy [name] today via telehealth video enabled device and verified that I am speaking with the correct person using two identifiers.   Location: Patient: home Provider: work  Persons participating in virtual visit: Jeremy [name], Delsa Grana  I discussed the limitations, risks, security and privacy concerns of performing an evaluation and management service by video and the availability of in person appointments. The patient expressed understanding and agreed to proceed.   Some vital signs may be absent or patient reported.     Subjective:   Jeremy [name] is a 71 y.o. male who presents for Medicare Annual/Subsequent preventive examination.  Review of Systems     Cardiac Risk Factors include: advanced age (>57men, >81 women);male gender;dyslipidemia     Objective:    Today's Vitals   04/12/21 1602  Weight: 180 lb (81.6 kg)  Height: 5\' 10"  (1.778 m)   Body mass index is 25.83 kg/m.  Advanced Directives 04/02/2018  Does Patient Have a Medical Advance Directive? Yes  Type of Paramedic of [place];Living will  Does patient want to make changes to medical advance directive? No - Patient declined  Copy of [place] in Chart? No - copy requested    Current Medications (verified) Outpatient Encounter Medications as of 04/12/2021  Medication Sig   aspirin 81 MG tablet Take 81 mg by mouth daily.   clopidogrel (PLAVIX) 75 MG tablet TAKE 1 TABLET DAILY   ezetimibe (ZETIA) 10 MG tablet Take 1 tablet (10 mg total) by mouth daily.   fluticasone (FLONASE) 50 MCG/ACT nasal spray Place 2 sprays into the nose as directed.   rosuvastatin (CRESTOR) 20 MG tablet TAKE 1 TABLET DAILY   sildenafil (VIAGRA) 100 MG tablet Take 1 tablet (100 mg total) by mouth as needed for erectile dysfunction.   COVID-19 mRNA bivalent vaccine, Pfizer, (PFIZER COVID-19 VAC BIVALENT) injection Inject into the muscle.   No facility-administered  encounter medications on file as of 04/12/2021.    Allergies (verified) Patient has no known allergies.   History: Past Medical History:  Diagnosis Date   Colon polyp 07/05/2007   TUBULAR ADENOMA   Coronary artery disease    Cardiac catheterization 8/07: Proximal LAD 50-75%, mid LAD 60%, ostial septal perforator 80%, proximal RCA 50% and then 80-85%, distal RCA 80-90%, EF 55-60%.  PCI was performed at that time colon distal RCA treated with a Taxus drug-eluting stent.  The proximal RCA treated with a Taxus drug eluting stent.  The LAD was studied with IVUS and treated with a Taxus drug eluding stent.     Hyperlipidemia    Ill-defined closed fractures of upper limb    Sinus bradycardia    sees Dr. Caryl Comes    Syncope    Past Surgical History:  Procedure Laterality Date   APPENDECTOMY     COLONOSCOPY  06-20-13   per Dr. 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 Married ''89. No children. Occupation: Advertising copywriter      04/02/2018: Lives with wife in 3 story house   Has family scattered around the [place]    Works FT as Network engineer, enjoys working still   Freight forwarder to gym 3days/week   Enjoys reading   Social Determinants of Radio broadcast assistant Strain: Low Risk    Difficulty of Paying Living Expenses: Not hard at [employer] Insecurity: No Food Insecurity   Worried About Charity fundraiser in the Last Year: Never true   Arboriculturist in the Last Year: Never true  Transportation Needs: No Data processing manager (Medical): No   Lack of Transportation (Non-Medical): No  Physical Activity: Sufficiently Active   Days of Exercise per Week: 3 days   Minutes of Exercise per Session: 50 min  Stress: No Stress Concern Present   Feeling of Stress : Not at all  Social Connections: Moderately Integrated   Frequency of Communication with Friends and Family: More than three times a week   Frequency of Social Gatherings with Friends and Family: More than three times a week   Attends Religious Services: Never   Marine scientist or Organizations: Yes   Attends Music therapist: More than 4 times per year   Marital Status: Married    Tobacco Counseling Counseling given: Not Answered   Clinical Intake:  Pre-visit preparation completed: Yes        Nutritional Status: BMI 25 -29 Overweight Nutritional Risks: None Diabetes: No  How often do you need to have someone help you when you read instructions, pamphlets, or other written materials from your doctor or pharmacy?: 1 - Never What is the last grade level you completed in school?: some college  Diabetic? no  Interpreter Needed?: No  Information entered by :: NAllen [name]   Activities of Daily Living In your present state of health, do you have any difficulty performing the following activities: 04/12/2021 05/07/2020  Hearing? N N  Vision? N N  Difficulty concentrating or making decisions? N  N  Walking or climbing stairs? N N  Dressing or bathing? N N  Doing errands, shopping? N N  Preparing Food and eating ? N N  Using the Toilet? N N  In the past six months, have you accidently leaked urine? N N  Do you have problems with loss of bowel control? N N  Managing your Medications? N N  Managing your Finances? N N  Housekeeping or managing your Housekeeping? N N  Some recent data might be hidden    Patient Care Team: Laurey Morale, MD as PCP - General (Family Medicine) Deboraha Sprang, MD  as Consulting Physician (Cardiology)  Indicate any recent Medical Services you may have received from other than [facility] providers in the past year (date may be approximate).     Assessment:   This is a routine wellness examination for [place].  Hearing/Vision screen Vision Screening - Comments:: Regular eye exams, Dr. Alois Cliche  Dietary issues and exercise activities discussed: Current Exercise Habits: Home exercise routine, Type of exercise: treadmill, Time (Minutes): 50, Frequency (Times/Week): 3, Weekly Exercise (Minutes/Week): 150   Goals Addressed             This Visit's Progress    Patient Stated       04/12/2021, lose some belly fat       Depression Screen PHQ 2/9 Scores 04/12/2021 05/07/2020 04/02/2018 04/02/2018 12/01/2016 10/29/2015 10/08/2014  PHQ - 2 Score 0 0 0 0 0 0 0  PHQ- 9 Score - - 0 - - - -    Fall Risk Fall Risk  04/12/2021 08/25/2020 05/07/2020 04/02/2018 04/02/2018  Falls in the past year? 0 0 0 0 0  Number falls in past yr: - - 0 - -  Injury with Fall? - - 0 - -  Risk for fall due to : Medication side effect - - History of fall(s);Impaired vision -  Follow up Falls evaluation completed;Education provided;Falls prevention discussed - - Falls prevention discussed -    FALL RISK PREVENTION PERTAINING TO THE HOME:  Any stairs in or around the home? Yes  If so, are there any without handrails? No  Home free of loose throw rugs in walkways, pet beds, electrical  cords, etc? Yes  Adequate lighting in your home to reduce risk of falls? Yes   ASSISTIVE DEVICES UTILIZED TO PREVENT FALLS:  Life alert? No  Use of a cane, walker or w/c? No  Grab bars in the bathroom? Yes  Shower chair or bench in shower? No  Elevated toilet seat or a handicapped toilet? Yes   TIMED UP AND GO:  Was the test performed? No .      Cognitive Function:        Immunizations Immunization History  Administered Date(s) Administered   Fluad Quad(high Dose 65+) 11/11/2020   Influenza Whole 03/03/2009   Influenza, High Dose Seasonal PF 10/08/2014, 10/29/2015, 11/14/2016   Influenza, Seasonal, Injecte, Preservative Fre 02/20/2012   Influenza,inj,Quad PF,6+ Mos 02/18/2014, 11/23/2017, 11/27/2018   PFIZER(Purple Top)SARS-COV-2 Vaccination 02/22/2019, 03/15/2019, 11/05/2019   Pfizer Covid-19 Vaccine Bivalent Booster 71yrs & up 10/26/2020   Pneumococcal Conjugate-13 04/18/2013   Pneumococcal Polysaccharide-23 04/20/2011   Td 03/03/2009   Zoster, Live 04/20/2011    TDAP status: Due, Education has been provided regarding the importance of this vaccine. Advised may receive this vaccine at local pharmacy or Health Dept. Aware to provide a copy of the vaccination record if obtained from local pharmacy or Health Dept. Verbalized acceptance and understanding.  Flu Vaccine status: Up to date  Pneumococcal vaccine status: Up to date  Covid-19 vaccine status: Completed vaccines  Qualifies for Shingles Vaccine? Yes   Zostavax completed No   Shingrix Completed?: No.    Education has been provided regarding the importance of this vaccine. Patient has been advised to call insurance company to determine out of pocket expense if they have not yet received this vaccine. Advised may also receive vaccine at local pharmacy or Health Dept. Verbalized acceptance and understanding.  Screening Tests Health Maintenance  Topic Date Due   TETANUS/TDAP  03/04/2019   Zoster Vaccines-  Shingrix (1  of 2) 08/25/2021 (Originally 05/14/1999)   Pneumonia Vaccine 46+ Years old (60) 04/13/2022 (Originally 04/19/2016)   COLONOSCOPY (Pts 45-56yrs Insurance coverage will need to be confirmed)  06/21/2023   INFLUENZA VACCINE  Completed   COVID-19 Vaccine  Completed   Hepatitis C Screening  Completed   HPV VACCINES  Aged Out    Health Maintenance  Health Maintenance Due  Topic Date Due   TETANUS/TDAP  03/04/2019    Colorectal cancer screening: Type of screening: Colonoscopy. Completed 06/20/2013. Repeat every 10 years  Lung Cancer Screening: (Low Dose CT Chest recommended if Age 69-80 years, 30 pack-year currently smoking OR have quit w/in 15years.) does not qualify.   Lung Cancer Screening Referral: no  Additional Screening:  Hepatitis C Screening: does qualify; Completed 04/05/2018  Vision Screening: Recommended annual ophthalmology exams for early detection of glaucoma and other disorders of the eye. Is the patient up to date with their annual eye exam?  Yes  Who is the provider or what is the name of the office in which the patient attends annual eye exams? Dr. Idolina Primer If pt is not established with a provider, would they like to be referred to a provider to establish care? No .   Dental Screening: Recommended annual dental exams for proper oral hygiene  Community Resource Referral / Chronic Care Management: CRR required this visit?  No   CCM required this visit?  No      Plan:     I have personally reviewed and noted the following in the patients chart:   Medical and social history Use of alcohol, tobacco or illicit drugs  Current medications and supplements including opioid prescriptions. Patient is not currently taking opioid prescriptions. Functional ability and status Nutritional status Physical activity Advanced directives List of other physicians Hospitalizations, surgeries, and ER visits in previous 12 months Vitals Screenings to include cognitive,  depression, and falls Referrals and appointments  In addition, I have reviewed and discussed with patient certain preventive protocols, quality metrics, and best practice recommendations. A written personalized care plan for preventive services as well as general preventive health recommendations were provided to patient.     Kellie Simmering, [name]   [id number]   Nurse Notes: 6 CIT not administered. Patient has normal cognition per conversation.  Due to this being a virtual visit, the after visit summary with patients personalized plan was offered to patient via mail or my-chart.  patient was mailed a copy of AVS.

## 2021-04-12 NOTE — Patient Instructions (Signed)
Jeremy [name] , Thank you for taking time to come for your Medicare Wellness Visit. I appreciate your ongoing commitment to your health goals. Please review the following plan we discussed and let me know if I can assist you in the future.   Screening recommendations/referrals: Colonoscopy: completed 06/20/2013, due 06/21/2023 Recommended yearly ophthalmology/optometry visit for glaucoma screening and checkup Recommended yearly dental visit for hygiene and checkup  Vaccinations: Influenza vaccine: completed 11/11/2020, due next flu season Pneumococcal vaccine: completed 04/18/2013 Tdap vaccine: due Shingles vaccine: due   Covid-19:  02/22/2019, 03/15/2019, 11/05/2019, 10/26/2020  Advanced directives: Please bring a copy of your POA (Power of Attorney) and/or Living Will to your next appointment.   Conditions/risks identified: none  Next appointment: Follow up in one year for your annual wellness visit.   Preventive Care 72 Years and Older, Male Preventive care refers to lifestyle choices and visits with your health care provider that can promote health and wellness. What does preventive care include? A yearly physical exam. This is also called an annual well check. Dental exams once or twice a year. Routine eye exams. Ask your health care provider how often you should have your eyes checked. Personal lifestyle choices, including: Daily care of your teeth and gums. Regular physical activity. Eating a healthy diet. Avoiding tobacco and drug use. Limiting alcohol use. Practicing safe sex. Taking low doses of aspirin every day. Taking vitamin and mineral supplements as recommended by your health care provider. What happens during an annual well check? The services and screenings done by your health care provider during your annual well check will depend on your age, overall health, lifestyle risk factors, and family history of disease. Counseling  Your health care provider may ask you  questions about your: Alcohol use. Tobacco use. Drug use. Emotional well-being. Home and relationship well-being. Sexual activity. Eating habits. History of falls. Memory and ability to understand (cognition). Work and work Astronomer. Screening  You may have the following tests or measurements: Height, weight, and BMI. Blood pressure. Lipid and cholesterol levels. These may be checked every 5 years, or more frequently if you are over 72 years old. Skin check. Lung cancer screening. You may have this screening every year starting at age 60 if you have a 30-pack-year history of smoking and currently smoke or have quit within the past 15 years. Fecal occult blood test (FOBT) of the stool. You may have this test every year starting at age 61. Flexible sigmoidoscopy or colonoscopy. You may have a sigmoidoscopy every 5 years or a colonoscopy every 10 years starting at age 72. Prostate cancer screening. Recommendations will vary depending on your family history and other risks. Hepatitis C blood test. Hepatitis B blood test. Sexually transmitted disease (STD) testing. Diabetes screening. This is done by checking your blood sugar (glucose) after you have not eaten for a while (fasting). You may have this done every 1-3 years. Abdominal aortic aneurysm (AAA) screening. You may need this if you are a current or former smoker. Osteoporosis. You may be screened starting at age 60 if you are at high risk. Talk with your health care provider about your test results, treatment options, and if necessary, the need for more tests. Vaccines  Your health care provider may recommend certain vaccines, such as: Influenza vaccine. This is recommended every year. Tetanus, diphtheria, and acellular pertussis (Tdap, Td) vaccine. You may need a Td booster every 10 years. Zoster vaccine. You may need this after age 31. Pneumococcal 13-valent conjugate (PCV13) vaccine.  One dose is recommended after age  72. Pneumococcal polysaccharide (PPSV23) vaccine. One dose is recommended after age 67. Talk to your health care provider about which screenings and vaccines you need and how often you need them. This information is not intended to replace advice given to you by your health care provider. Make sure you discuss any questions you have with your health care provider. Document Released: 02/13/2015 Document Revised: 10/07/2015 Document Reviewed: 11/18/2014 Elsevier Interactive Patient Education  2017 [place] Prevention in the Home Falls can cause injuries. They can happen to people of all ages. There are many things you can do to make your home safe and to help prevent falls. What can I do on the outside of my home? Regularly fix the edges of walkways and driveways and fix any cracks. Remove anything that might make you trip as you walk through a door, such as a raised step or threshold. Trim any bushes or trees on the path to your home. Use bright outdoor lighting. Clear any walking paths of anything that might make someone trip, such as rocks or tools. Regularly check to see if handrails are loose or broken. Make sure that both sides of any steps have handrails. Any raised decks and porches should have guardrails on the edges. Have any leaves, snow, or ice cleared regularly. Use sand or salt on walking paths during winter. Clean up any spills in your garage right away. This includes oil or grease spills. What can I do in the bathroom? Use night lights. Install grab bars by the toilet and in the tub and shower. Do not use towel bars as grab bars. Use non-skid mats or decals in the tub or shower. If you need to sit down in the shower, use a plastic, non-slip stool. Keep the floor dry. Clean up any water that spills on the floor as soon as it happens. Remove soap buildup in the tub or shower regularly. Attach bath mats securely with double-sided non-slip rug tape. Do not have throw  rugs and other things on the floor that can make you trip. What can I do in the bedroom? Use night lights. Make sure that you have a light by your bed that is easy to reach. Do not use any sheets or blankets that are too big for your bed. They should not hang down onto the floor. Have a firm chair that has side arms. You can use this for support while you get dressed. Do not have throw rugs and other things on the floor that can make you trip. What can I do in the kitchen? Clean up any spills right away. Avoid walking on wet floors. Keep items that you use a lot in easy-to-reach places. If you need to reach something above you, use a strong step stool that has a grab bar. Keep electrical cords out of the way. Do not use floor polish or wax that makes floors slippery. If you must use wax, use non-skid floor wax. Do not have throw rugs and other things on the floor that can make you trip. What can I do with my stairs? Do not leave any items on the stairs. Make sure that there are handrails on both sides of the stairs and use them. Fix handrails that are broken or loose. Make sure that handrails are as long as the stairways. Check any carpeting to make sure that it is firmly attached to the stairs. Fix any carpet that is loose or  worn. Avoid having throw rugs at the top or bottom of the stairs. If you do have throw rugs, attach them to the floor with carpet tape. Make sure that you have a light switch at the top of the stairs and the bottom of the stairs. If you do not have them, ask someone to add them for you. What else can I do to help prevent falls? Wear shoes that: Do not have high heels. Have rubber bottoms. Are comfortable and fit you well. Are closed at the toe. Do not wear sandals. If you use a stepladder: Make sure that it is fully opened. Do not climb a closed stepladder. Make sure that both sides of the stepladder are locked into place. Ask someone to hold it for you, if  possible. Clearly mark and make sure that you can see: Any grab bars or handrails. First and last steps. Where the edge of each step is. Use tools that help you move around (mobility aids) if they are needed. These include: Canes. Walkers. Scooters. Crutches. Turn on the lights when you go into a dark area. Replace any light bulbs as soon as they burn out. Set up your furniture so you have a clear path. Avoid moving your furniture around. If any of your floors are uneven, fix them. If there are any pets around you, be aware of where they are. Review your medicines with your doctor. Some medicines can make you feel dizzy. This can increase your chance of falling. Ask your doctor what other things that you can do to help prevent falls. This information is not intended to replace advice given to you by your health care provider. Make sure you discuss any questions you have with your health care provider. Document Released: 11/13/2008 Document Revised: 06/25/2015 Document Reviewed: 02/21/2014 Elsevier Interactive Patient Education  2017 [employer].

## 2021-07-01 ENCOUNTER — Other Ambulatory Visit: Payer: Self-pay | Admitting: Family Medicine

## 2021-07-01 DIAGNOSIS — E785 Hyperlipidemia, unspecified: Secondary | ICD-10-CM

## 2021-08-27 ENCOUNTER — Ambulatory Visit (INDEPENDENT_AMBULATORY_CARE_PROVIDER_SITE_OTHER): Payer: Medicare HMO

## 2021-08-27 ENCOUNTER — Ambulatory Visit
Admission: RE | Admit: 2021-08-27 | Discharge: 2021-08-27 | Disposition: A | Discharge status: Home / Self Care | Payer: Medicare HMO | Source: Ambulatory Visit | Attending: Emergency Medicine | Admitting: Emergency Medicine

## 2021-08-27 VITALS — BP 163/84 | HR 53 | Temp 98.1°F | Resp 16

## 2021-08-27 DIAGNOSIS — S6992XA Unspecified injury of left wrist, hand and finger(s), initial encounter: Secondary | ICD-10-CM

## 2021-08-27 DIAGNOSIS — M25532 Pain in left wrist: Secondary | ICD-10-CM

## 2021-08-27 DIAGNOSIS — M5442 Lumbago with sciatica, left side: Secondary | ICD-10-CM | POA: Diagnosis not present

## 2021-08-27 DIAGNOSIS — M19032 Primary osteoarthritis, left wrist: Secondary | ICD-10-CM | POA: Diagnosis not present

## 2021-08-27 DIAGNOSIS — M7989 Other specified soft tissue disorders: Secondary | ICD-10-CM | POA: Diagnosis not present

## 2021-08-27 DIAGNOSIS — M79642 Pain in left hand: Secondary | ICD-10-CM | POA: Diagnosis not present

## 2021-08-27 DIAGNOSIS — W19XXXA Unspecified fall, initial encounter: Secondary | ICD-10-CM

## 2021-08-27 NOTE — ED Provider Notes (Signed)
[facility]    CSN: [id number] Arrival date & time: 08/27/21  1020    HISTORY   Chief Complaint  Patient presents with   Finger Injury    Entered by patient   HPI Jeremy [name] is a pleasant, 71 y.o. male who presents to urgent care today. Patient reports falling 2 days ago and landing on his left hand.  Patient states that he has noticed increasing swelling and loss of range of motion of his left wrist and his left third and fourth fingers.  Patient denies prior traumatic injury to his left wrist and hand.  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  Patient states he has not tried any remedies to alleviate his discomfort.  Patient denies loss of grip strength or range of motion.  The history is provided by the patient.   Past Medical History:  Diagnosis Date   Colon polyp 07/05/2007   TUBULAR ADENOMA   Coronary artery disease    Cardiac catheterization 8/07: Proximal LAD 50-75%, mid LAD 60%, ostial septal perforator 80%, proximal RCA 50% and then 80-85%, distal RCA 80-90%, EF 55-60%.  PCI was performed at that time colon distal RCA treated with a Taxus drug-eluting stent.  The proximal RCA treated with a Taxus drug eluting stent.  The LAD was studied with IVUS and treated with a Taxus drug eluding stent.     Hyperlipidemia    Ill-defined closed fractures of upper limb    Sinus bradycardia    sees Dr. Graciela Husbands    Syncope    Patient Active Problem List   Diagnosis Date Noted   BPH with urinary obstruction 08/16/2019   Acute left-sided low back pain with left-sided sciatica 02/10/2019   Long term current use of antithrombotics/antiplatelets - clopidogrel - cardiac stents 04/01/2013   Sinus bradycardia 05/25/2010   SYNCOPE 04/27/2009   Personal history of colonic polyps 07/10/2007   Dyslipidemia 04/28/2007   coronary artery disease-tandem stenting DES-RCA; stent to LAD 2007 04/28/2007   Past  Surgical History:  Procedure Laterality Date   APPENDECTOMY     COLONOSCOPY  06-20-13   per Dr. Leone Payor, clear, repeat in 10 yrs    CORONARY ANGIOPLASTY      Home Medications    Prior to Admission medications   Medication Sig Start Date End Date Taking? Authorizing Provider  aspirin 81 MG tablet Take 81 mg by mouth daily.    [provider]  clopidogrel (PLAVIX) 75 MG tablet TAKE 1 TABLET DAILY 07/02/21   Nelwyn Salisbury, MD  COVID-19 mRNA bivalent vaccine, Pfizer, (PFIZER COVID-19 VAC BIVALENT) injection Inject into the muscle. 10/26/20   Judyann Munson, MD  ezetimibe (ZETIA) 10 MG tablet Take 1 tablet (10 mg total) by mouth daily. 11/25/20   Duke Salvia, MD  fluticasone [facility]) 50 MCG/ACT nasal spray Place 2 sprays into the nose as directed. 04/23/12   Norins, Rosalyn Gess, MD  rosuvastatin (CRESTOR) 20 MG tablet TAKE 1 TABLET DAILY 07/02/21   Nelwyn Salisbury, MD  sildenafil (VIAGRA) 100 MG tablet Take 1 tablet (100 mg total) by mouth as needed for erectile dysfunction. 01/14/21   Nelwyn Salisbury, MD    Family History Family History  Problem Relation Age of Onset   Prostate cancer Father    Cancer Father        prostate cancer w/ mets   Heart disease Father  CAD   Hyperlipidemia Father    Stroke Mother    Colon cancer Maternal Uncle 60   Heart attack Paternal Grandmother    COPD Neg Hx    Diabetes Neg Hx    Social History Social History   Tobacco Use   Smoking status: Former   Smokeless tobacco: Never  Building services engineer Use: Never used  Substance Use Topics   Alcohol use: Yes    Alcohol/week: 0.0 standard drinks of alcohol    Comment: occ   Drug use: No   Allergies   Patient has no known allergies.  Review of Systems Review of Systems Pertinent findings revealed after performing a 14 point review of systems has been noted in the history of present illness.  Physical Exam Triage Vital Signs ED Triage Vitals  Enc Vitals Group     BP 11/27/20  0827 (!) 147/82     Pulse Rate 11/27/20 0827 72     Resp 11/27/20 0827 18     Temp 11/27/20 0827 98.3 F (36.8 C)     Temp Source 11/27/20 0827 Oral     SpO2 11/27/20 0827 98 %     Weight --      Height --      Head Circumference --      Peak Flow --      Pain Score 11/27/20 0826 5     Pain Loc --      Pain Edu? --      Excl. in GC? --    Updated Vital Signs BP (!) 163/84 (BP Location: Right Arm)   Pulse (!) 53   Temp 98.1 F (36.7 C) (Oral)   Resp 16   SpO2 94%   Physical Exam Vitals and nursing note reviewed.  Constitutional:      General: He is not in acute distress.    Appearance: Normal appearance. He is normal weight. He is not ill-appearing.  HENT:     Head: Normocephalic and atraumatic.  Eyes:     Extraocular Movements: Extraocular movements intact.     Conjunctiva/sclera: Conjunctivae normal.     Pupils: Pupils are equal, round, and reactive to light.  Cardiovascular:     Rate and Rhythm: Normal rate and regular rhythm.  Pulmonary:     Effort: Pulmonary effort is normal.     Breath sounds: Normal breath sounds.  Musculoskeletal:     Right wrist: Normal.     Left wrist: Swelling and tenderness present. No deformity, effusion, lacerations, bony tenderness, snuff box tenderness or crepitus. Decreased range of motion. Normal pulse.     Right hand: Normal.     Left hand: Swelling and tenderness present. No deformity, lacerations or bony tenderness. Normal range of motion. Normal strength. Normal sensation. There is no disruption of two-point discrimination. Normal capillary refill. Normal pulse.     Cervical back: Normal range of motion and neck supple.  Skin:    General: Skin is warm and dry.  Neurological:     General: No focal deficit present.     Mental Status: He is alert and oriented to person, place, and time. Mental status is at baseline.  Psychiatric:        Mood and Affect: Mood normal.        Behavior: Behavior normal.        Thought Content:  Thought content normal.        Judgment: Judgment normal.     [facility] / Diagnostics /  Procedures:     Radiology DG Wrist Complete Left  Result Date: 08/27/2021 CLINICAL DATA:  Pain, swelling and stiffness in the left wrist and 2 middle fingers after falling. EXAM: LEFT WRIST - COMPLETE 3+ VIEW; LEFT HAND - COMPLETE 3+ VIEW COMPARISON:  None Available. FINDINGS: The mineralization and alignment are normal. There is no evidence of acute fracture or dislocation. There are mild degenerative changes at the scaphotrapeziotrapezoidal articulation. Lunotriquetral coalition noted (normal variant). Minimal degenerative changes at the 1st and 2nd metacarpal phalangeal joint. The interphalangeal joint spaces are preserved. Apparent dorsal soft tissue swelling in the wrist. Possible mild soft tissue swelling in the index and long fingers. No foreign bodies are identified. IMPRESSION: 1. No acute osseous findings identified within the left wrist or hand. 2. Nonspecific soft tissue swelling which could reflect soft tissue injury. No evidence of foreign body. 3. Mild degenerative changes as described. Electronically Signed   By: Carey Bullocks M.D.   On: 08/27/2021 11:07   DG Hand Complete Left  Result Date: 08/27/2021 CLINICAL DATA:  Pain, swelling and stiffness in the left wrist and 2 middle fingers after falling. EXAM: LEFT WRIST - COMPLETE 3+ VIEW; LEFT HAND - COMPLETE 3+ VIEW COMPARISON:  None Available. FINDINGS: The mineralization and alignment are normal. There is no evidence of acute fracture or dislocation. There are mild degenerative changes at the scaphotrapeziotrapezoidal articulation. Lunotriquetral coalition noted (normal variant). Minimal degenerative changes at the 1st and 2nd metacarpal phalangeal joint. The interphalangeal joint spaces are preserved. Apparent dorsal soft tissue swelling in the wrist. Possible mild soft tissue swelling in the index and long fingers. No foreign bodies are  identified. IMPRESSION: 1. No acute osseous findings identified within the left wrist or hand. 2. Nonspecific soft tissue swelling which could reflect soft tissue injury. No evidence of foreign body. 3. Mild degenerative changes as described. Electronically Signed   By: Carey Bullocks M.D.   On: 08/27/2021 11:07    Procedures Procedures (including critical care time) EKG  Pending results:  Labs Reviewed - No data to display  Medications Ordered in UC: Medications - No data to display  UC Diagnoses / Final Clinical Impressions(s)   I have reviewed the triage vital signs and the nursing notes.  Pertinent labs & imaging results that were available during my care of the patient were reviewed by me and considered in my medical decision making (see chart for details).    Final diagnoses:  Fall, initial encounter  Wrist pain, acute, left  Arthritis of left wrist  Injury of left hand, initial encounter  Acute left-sided low back pain with left-sided sciatica   X-ray was negative for any acute bony abnormalities but did reveal some degenerative changes in his wrist and soft tissue swelling.  Patient advised that he is likely sprained his wrist and hand I do not recommend wearing a brace or splint at this time if he is not having pain and not performing any repetitive test that cause him pain.  Patient advised that splinting his wrist may actually make his stiffness worse.  Patient advised to consider OTC pain relief such as ibuprofen, Tylenol or Voltaren gel.  Patient advised to follow-up with orthopedics or primary care if not improving or worsening in the next 7 to 10 days.  ED Prescriptions   None    PDMP not reviewed this encounter.  Discharge Instructions:   Discharge Instructions      The x-ray of your left wrist and hand did not reveal  any acute bony abnormalities however did reveal some degenerative, arthritic changes which is likely not bother you until now.  I believe it is  likely that you have strained your hand and that you can expect this to improve over the next few weeks.  I do not recommend wearing a wrist brace, you seem to have good grip strength and wearing a brace may actually further your feelings of stiffness in your wrist joint.  I recommend that you do any activity that you can do without pain and avoid those that cause you pain.  I do believe you will benefit from topical Voltaren gel, Tylenol 1000 mg 3 times daily or ibuprofen 400 mg 3 times daily for intermittent pain relief.  In the next 7 to 10 days, if you do not see meaningful improvement of your stiffness and pain or if it seems to be getting worse instead of better, please follow-up with either your regular provider or consider going to an orthopedic urgent care clinic such as someone over at [facility] or [facility].  I have provided their contact information for you.  Thank you for visiting urgent care today.      Disposition Upon Discharge:  Condition: stable for discharge home Home: take medications as prescribed; routine discharge instructions as discussed; follow up as advised.  Patient presented with an acute illness with associated systemic symptoms and significant discomfort requiring urgent management. In my opinion, this is a condition that a prudent lay person (someone who possesses an average knowledge of health and medicine) may potentially expect to result in complications if not addressed urgently such as respiratory distress, impairment of bodily function or dysfunction of bodily organs.   Routine symptom specific, illness specific and/or disease specific instructions were discussed with the patient and/or caregiver at length.   As such, the patient has been evaluated and assessed, work-up was performed and treatment was provided in alignment with urgent care protocols and evidence based medicine.  Patient/parent/caregiver has been advised that the patient may  require follow up for further testing and treatment if the symptoms continue in spite of treatment, as clinically indicated and appropriate.  Patient/parent/caregiver has been advised to report to orthopedic urgent care clinic or return to the [facility] or PCP in 3-5 days if no better; follow-up with orthopedics, PCP or the Emergency Department if new signs and symptoms develop or if the current signs or symptoms continue to change or worsen for further workup, evaluation and treatment as clinically indicated and appropriate  The patient will follow up with their current PCP if and as advised. If the patient does not currently have a PCP we will have assisted them in obtaining one.   The patient may need specialty follow up if the symptoms continue, in spite of conservative treatment and management, for further workup, evaluation, consultation and treatment as clinically indicated and appropriate.  Patient/parent/caregiver verbalized understanding and agreement of plan as discussed.  All questions were addressed during visit.  Please see discharge instructions below for further details of plan.  This office note has been dictated using Teaching laboratory technician.  Unfortunately, this method of dictation can sometimes lead to typographical or grammatical errors.  I apologize for your inconvenience in advance if this occurs.  Please do not hesitate to reach out to me if clarification is needed.      Theadora Rama Scales, PA-C 08/27/21 1125

## 2021-08-27 NOTE — Discharge Instructions (Addendum)
The x-ray of your left wrist and hand did not reveal any acute bony abnormalities however did reveal some degenerative, arthritic changes which is likely not bother you until now.  I believe it is likely that you have strained your hand and that you can expect this to improve over the next few weeks.  I do not recommend wearing a wrist brace, you seem to have good grip strength and wearing a brace may actually further your feelings of stiffness in your wrist joint.  I recommend that you do any activity that you can do without pain and avoid those that cause you pain.  I do believe you will benefit from topical Voltaren gel, Tylenol 1000 mg 3 times daily or ibuprofen 400 mg 3 times daily for intermittent pain relief.  In the next 7 to 10 days, if you do not see meaningful improvement of your stiffness and pain or if it seems to be getting worse instead of better, please follow-up with either your regular provider or consider going to an orthopedic urgent care clinic such as someone over at [facility] or [facility].  I have provided their contact information for you.  Thank you for visiting urgent care today.

## 2021-08-27 NOTE — ED Triage Notes (Signed)
The patient states he had a fall and is now having swelling and stiffness to his left wrist and two middle fingers.  Started: Wednesday

## 2021-09-17 ENCOUNTER — Other Ambulatory Visit: Payer: Self-pay | Admitting: Family Medicine

## 2021-09-17 DIAGNOSIS — E785 Hyperlipidemia, unspecified: Secondary | ICD-10-CM

## 2021-09-27 ENCOUNTER — Other Ambulatory Visit: Payer: Self-pay | Admitting: Family Medicine

## 2021-09-27 DIAGNOSIS — E785 Hyperlipidemia, unspecified: Secondary | ICD-10-CM

## 2021-09-28 ENCOUNTER — Telehealth: Payer: Self-pay | Admitting: Family Medicine

## 2021-09-28 ENCOUNTER — Other Ambulatory Visit: Payer: Self-pay

## 2021-09-28 DIAGNOSIS — E785 Hyperlipidemia, unspecified: Secondary | ICD-10-CM

## 2021-09-28 MED ORDER — ROSUVASTATIN CALCIUM 20 MG PO TABS: 20.0000 mg | ORAL_TABLET | Freq: Every day | ORAL | 0 refills | Status: DC

## 2021-09-28 MED ORDER — CLOPIDOGREL BISULFATE 75 MG PO TABS: 75.0000 mg | ORAL_TABLET | Freq: Every day | ORAL | 0 refills | Status: DC

## 2021-09-28 NOTE — Telephone Encounter (Signed)
Pt has cpe sch for 10-08-2021 and would like refills on clopidogrel (PLAVIX) 75 MG tablet and rosuvastatin (CRESTOR) 20 MG tablet   [facility] - [place], [place] - [facility] AT Portal to [facility] Phone:  [phone]  Fax:  [phone]

## 2021-09-28 NOTE — Telephone Encounter (Signed)
Refills sent to [facility].

## 2021-10-05 ENCOUNTER — Encounter: Payer: Medicare HMO | Admitting: Family Medicine

## 2021-10-08 ENCOUNTER — Encounter: Payer: Self-pay | Admitting: Family Medicine

## 2021-10-08 ENCOUNTER — Ambulatory Visit (INDEPENDENT_AMBULATORY_CARE_PROVIDER_SITE_OTHER): Payer: Medicare HMO | Admitting: Family Medicine

## 2021-10-08 VITALS — BP 124/72 | HR 45 | Temp 97.6°F | Ht 70.0 in | Wt 179.0 lb

## 2021-10-08 DIAGNOSIS — N401 Enlarged prostate with lower urinary tract symptoms: Secondary | ICD-10-CM

## 2021-10-08 DIAGNOSIS — E785 Hyperlipidemia, unspecified: Secondary | ICD-10-CM

## 2021-10-08 DIAGNOSIS — I2510 Atherosclerotic heart disease of native coronary artery without angina pectoris: Secondary | ICD-10-CM | POA: Diagnosis not present

## 2021-10-08 DIAGNOSIS — R001 Bradycardia, unspecified: Secondary | ICD-10-CM | POA: Diagnosis not present

## 2021-10-08 DIAGNOSIS — N138 Other obstructive and reflux uropathy: Secondary | ICD-10-CM

## 2021-10-08 DIAGNOSIS — R739 Hyperglycemia, unspecified: Secondary | ICD-10-CM | POA: Diagnosis not present

## 2021-10-08 DIAGNOSIS — Z23 Encounter for immunization: Secondary | ICD-10-CM | POA: Diagnosis not present

## 2021-10-08 LAB — HEPATIC FUNCTION PANEL
ALT: 35 U/L (ref 0–53)
AST: 32 U/L (ref 0–37)
Albumin: 4.1 g/dL (ref 3.5–5.2)
Alkaline Phosphatase: 69 U/L (ref 39–117)
Bilirubin, Direct: 0.3 mg/dL (ref 0.0–0.3)
Total Bilirubin: 1 mg/dL (ref 0.2–1.2)
Total Protein: 6.7 g/dL (ref 6.0–8.3)

## 2021-10-08 LAB — CBC WITH DIFFERENTIAL/PLATELET
Basophils Absolute: 0 10*3/uL (ref 0.0–0.1)
Basophils Relative: 0.6 % (ref 0.0–3.0)
Eosinophils Absolute: 0.4 10*3/uL (ref 0.0–0.7)
Eosinophils Relative: 6.8 % — ABNORMAL HIGH (ref 0.0–5.0)
HCT: 41.6 % (ref 39.0–52.0)
Hemoglobin: 13.7 g/dL (ref 13.0–17.0)
Lymphocytes Relative: 23 % (ref 12.0–46.0)
Lymphs Abs: 1.4 10*3/uL (ref 0.7–4.0)
MCHC: 33.1 g/dL (ref 30.0–36.0)
MCV: 91 fl (ref 78.0–100.0)
Monocytes Absolute: 0.7 10*3/uL (ref 0.1–1.0)
Monocytes Relative: 11.6 % (ref 3.0–12.0)
Neutro Abs: 3.5 10*3/uL (ref 1.4–7.7)
Neutrophils Relative %: 58 % (ref 43.0–77.0)
Platelets: 195 10*3/uL (ref 150.0–400.0)
RBC: 4.57 Mil/uL (ref 4.22–5.81)
RDW: 14.3 % (ref 11.5–15.5)
WBC: 6 10*3/uL (ref 4.0–10.5)

## 2021-10-08 LAB — TSH: TSH: 1.65 u[IU]/mL (ref 0.35–5.50)

## 2021-10-08 LAB — LIPID PANEL
Cholesterol: 98 mg/dL (ref 0–200)
HDL: 42.8 mg/dL (ref >39.00)
LDL Cholesterol: 46 mg/dL (ref 0–99)
NonHDL: 54.97
Total CHOL/HDL Ratio: 2
Triglycerides: 47 mg/dL (ref 0.0–149.0)
VLDL: 9.4 mg/dL (ref 0.0–40.0)

## 2021-10-08 LAB — BASIC METABOLIC PANEL
BUN: 18 mg/dL (ref 6–23)
CO2: 30 mEq/L (ref 19–32)
Calcium: 9.1 mg/dL (ref 8.4–10.5)
Chloride: 103 mEq/L (ref 96–112)
Creatinine, Ser: 0.94 mg/dL (ref 0.40–1.50)
GFR: 81.05 mL/min (ref >60.00)
Glucose, Bld: 78 mg/dL (ref 70–99)
Potassium: 4.3 mEq/L (ref 3.5–5.1)
Sodium: 139 mEq/L (ref 135–145)

## 2021-10-08 LAB — PSA: PSA: 0.35 ng/mL (ref 0.10–4.00)

## 2021-10-08 LAB — HEMOGLOBIN A1C: Hgb A1c MFr Bld: 6.3 % (ref 4.6–6.5)

## 2021-10-08 MED ORDER — ROSUVASTATIN CALCIUM 20 MG PO TABS: 20.0000 mg | ORAL_TABLET | Freq: Every day | ORAL | 3 refills | Status: DC

## 2021-10-08 MED ORDER — EZETIMIBE 10 MG PO TABS: 10.0000 mg | ORAL_TABLET | Freq: Every day | ORAL | 3 refills | Status: DC

## 2021-10-08 MED ORDER — CLOPIDOGREL BISULFATE 75 MG PO TABS: 75.0000 mg | ORAL_TABLET | Freq: Every day | ORAL | 3 refills | Status: DC

## 2021-10-08 NOTE — Progress Notes (Signed)
Subjective:    Patient ID: Jeremy [name], male    DOB: [birth year], 71 y.o.   MRN: [id number]  HPI Here to follow up on issues. He feels fine. He sees Dr. Graciela Husbands yearly for CAD and bradycardia. JC denies any SOB, chest pain, or lightheadedness on exertion. He gets up once a night to urinate.    Review of Systems  Constitutional: Negative.   HENT: Negative.    Eyes: Negative.   Respiratory: Negative.    Cardiovascular: Negative.   Gastrointestinal: Negative.   Genitourinary: Negative.   Musculoskeletal: Negative.   Skin: Negative.   Neurological: Negative.   Psychiatric/Behavioral: Negative.         Objective:   Physical Exam Constitutional:      General: He is not in acute distress.    Appearance: Normal appearance. He is well-developed. He is not diaphoretic.  HENT:     Head: Normocephalic and atraumatic.     Right Ear: External ear normal.     Left Ear: External ear normal.     Nose: Nose normal.     Mouth/Throat:     Pharynx: No oropharyngeal exudate.  Eyes:     General: No scleral icterus.       Right eye: No discharge.        Left eye: No discharge.     Conjunctiva/sclera: Conjunctivae normal.     Pupils: Pupils are equal, round, and reactive to light.  Neck:     Thyroid: No thyromegaly.     Vascular: No JVD.     Trachea: No tracheal deviation.  Cardiovascular:     Rate and Rhythm: Normal rate and regular rhythm.     Heart sounds: Normal heart sounds. No murmur heard.    No friction rub. No gallop.  Pulmonary:     Effort: Pulmonary effort is normal. No respiratory distress.     Breath sounds: Normal breath sounds. No wheezing or rales.  Chest:     Chest wall: No tenderness.  Abdominal:     General: Bowel sounds are normal. There is no distension.     Palpations: Abdomen is soft. There is no mass.     Tenderness: There is no abdominal tenderness. There is no guarding or rebound.  Genitourinary:    Penis: Normal. No tenderness.      Testes: Normal.      Prostate: Normal.     Rectum: Normal. Guaiac result negative.  Musculoskeletal:        General: No tenderness. Normal range of motion.     Cervical back: Neck supple.  Lymphadenopathy:     Cervical: No cervical adenopathy.  Skin:    General: Skin is warm and dry.     Coloration: Skin is not pale.     Findings: No erythema or rash.  Neurological:     Mental Status: He is alert and oriented to person, place, and time.     Cranial Nerves: No cranial nerve deficit.     Motor: No abnormal muscle tone.     Coordination: Coordination normal.     Deep Tendon Reflexes: Reflexes are normal and symmetric. Reflexes normal.  Psychiatric:        Behavior: Behavior normal.        Thought Content: Thought content normal.        Judgment: Judgment normal.           Assessment & Plan:  His CAD and bradycardia are stable and he seems to be doing  well. He will see Cardiology within the next month or so. His BPH is stable. We will get fasting labs today to check lipids, etc. We spent a total of ( 31  ) minutes reviewing records and discussing these issues.  Gershon Crane, MD

## 2021-10-08 NOTE — Addendum Note (Signed)
Addended by: Carola Rhine on: 10/08/2021 09:31 AM   Modules accepted: Orders

## 2021-12-22 ENCOUNTER — Ambulatory Visit: Payer: Medicare HMO | Admitting: Internal Medicine

## 2022-01-17 ENCOUNTER — Telehealth (INDEPENDENT_AMBULATORY_CARE_PROVIDER_SITE_OTHER): Payer: Medicare HMO | Admitting: Family Medicine

## 2022-01-17 ENCOUNTER — Encounter: Payer: Self-pay | Admitting: Family Medicine

## 2022-01-17 VITALS — Ht 70.0 in | Wt 185.0 lb

## 2022-01-17 DIAGNOSIS — U071 COVID-19: Secondary | ICD-10-CM | POA: Diagnosis not present

## 2022-01-17 NOTE — Progress Notes (Signed)
Virtual Visit via Video Note  I connected with Jeremy [name] on 01/17/22 at  3:00 PM EST by a video enabled telemedicine application 2/2 COVID-19 pandemic and verified that I am speaking with the correct person using two identifiers.  Location patient: home Location provider:work or home office Persons participating in the virtual visit: patient, provider  I discussed the limitations of evaluation and management by telemedicine and the availability of in person appointments. The patient expressed understanding and agreed to proceed.  Chief Complaint  Patient presents with   Covid Positive    Pt reports sx of runny nose, sore throat, cough. Fever subsided. Some fatigue and denied bodyache. Sx started on Friday. Taking tylenol.     HPI: Pt is a 71 yo male followed by Dr. Clent Ridges who was seen for acute conern.  Pt tested positive for COVID on Friday with symptoms starting Fri.  Mild temp, rhinorrhea, ST, mild cough, post nasal drainage.  Feels better today.   Took allertec and Tylenol.    Pt had all of the COVID vaccines.     ROS: See pertinent positives and negatives per HPI.  Past Medical History:  Diagnosis Date   Colon polyp 07/05/2007   TUBULAR ADENOMA   Coronary artery disease    Cardiac catheterization 8/07: Proximal LAD 50-75%, mid LAD 60%, ostial septal perforator 80%, proximal RCA 50% and then 80-85%, distal RCA 80-90%, EF 55-60%.  PCI was performed at that time colon distal RCA treated with a Taxus drug-eluting stent.  The proximal RCA treated with a Taxus drug eluting stent.  The LAD was studied with IVUS and treated with a Taxus drug eluding stent.     Hyperlipidemia    Ill-defined closed fractures of upper limb    Sinus bradycardia    sees Dr. Graciela Husbands    Syncope     Past Surgical History:  Procedure Laterality Date   APPENDECTOMY     COLONOSCOPY  06-20-13   per Dr. Leone Payor, clear, repeat in 10 yrs    CORONARY ANGIOPLASTY      Family History  Problem Relation Age of  Onset   Prostate cancer Father    Cancer Father        prostate cancer w/ mets   Heart disease Father        CAD   Hyperlipidemia Father    Stroke Mother    Colon cancer Maternal Uncle 60   Heart attack Paternal Grandmother    COPD Neg Hx    Diabetes Neg Hx    Current Outpatient Medications:    aspirin 81 MG tablet, Take 81 mg by mouth daily., Disp: , Rfl:    clopidogrel (PLAVIX) 75 MG tablet, Take 1 tablet (75 mg total) by mouth daily., Disp: 90 tablet, Rfl: 3   ezetimibe (ZETIA) 10 MG tablet, Take 1 tablet (10 mg total) by mouth daily., Disp: 90 tablet, Rfl: 3   fluticasone (FLONASE) 50 MCG/ACT nasal spray, Place 2 sprays into the nose as directed., Disp: 48 g, Rfl: 3   rosuvastatin (CRESTOR) 20 MG tablet, Take 1 tablet (20 mg total) by mouth daily., Disp: 90 tablet, Rfl: 3   sildenafil (VIAGRA) 100 MG tablet, Take 1 tablet (100 mg total) by mouth as needed for erectile dysfunction., Disp: 10 tablet, Rfl: 5   COVID-19 mRNA bivalent vaccine, Pfizer, (PFIZER COVID-19 VAC BIVALENT) injection, Inject into the muscle., Disp: 0.3 mL, Rfl: 0  EXAM:  VITALS per patient if applicable:  RR between 12-20 bpm  GENERAL:  alert, oriented, appears well and in no acute distress  HEENT: atraumatic, conjunctiva clear, no obvious abnormalities on inspection of external nose and ears  NECK: normal movements of the head and neck  LUNGS: on inspection no signs of respiratory distress, breathing rate appears normal, no obvious gross SOB, gasping or wheezing  CV: no obvious cyanosis  MS: moves all visible extremities without noticeable abnormality  PSYCH/NEURO: pleasant and cooperative, no obvious depression or anxiety, speech and thought processing grossly intact  ASSESSMENT AND PLAN:  Discussed the following assessment and plan:  COVID-19 virus infection -improving -symptoms starting Friday 01/14/22 with positive test that day. -discussed r/b/a of antiviral meds.  Pt declines at this time  as symptoms are improving. -continue expectant management -given precautions  F/u prn  I discussed the assessment and treatment plan with the patient. The patient was provided an opportunity to ask questions and all were answered. The patient agreed with the plan and demonstrated an understanding of the instructions.   The patient was advised to call back or seek an in-person evaluation if the symptoms worsen or if the condition fails to improve as anticipated.   Deeann Saint, MD

## 2022-01-19 ENCOUNTER — Ambulatory Visit: Payer: Medicare HMO | Admitting: Internal Medicine

## 2022-02-14 ENCOUNTER — Ambulatory Visit: Payer: Medicare HMO | Admitting: Internal Medicine

## 2022-03-14 ENCOUNTER — Encounter: Payer: Self-pay | Admitting: Internal Medicine

## 2022-03-14 ENCOUNTER — Ambulatory Visit: Payer: Medicare HMO | Attending: Internal Medicine | Admitting: Internal Medicine

## 2022-03-14 ENCOUNTER — Encounter: Payer: Self-pay | Admitting: Family Medicine

## 2022-03-14 VITALS — BP 156/74 | HR 51 | Ht 70.0 in | Wt 183.0 lb

## 2022-03-14 DIAGNOSIS — Z79899 Other long term (current) drug therapy: Secondary | ICD-10-CM

## 2022-03-14 DIAGNOSIS — E785 Hyperlipidemia, unspecified: Secondary | ICD-10-CM | POA: Diagnosis not present

## 2022-03-14 DIAGNOSIS — R55 Syncope and collapse: Secondary | ICD-10-CM | POA: Diagnosis not present

## 2022-03-14 DIAGNOSIS — R001 Bradycardia, unspecified: Secondary | ICD-10-CM | POA: Diagnosis not present

## 2022-03-14 MED ORDER — HYDROCHLOROTHIAZIDE 12.5 MG PO CAPS: 12.5000 mg | ORAL_CAPSULE | Freq: Every day | ORAL | 3 refills | Status: DC

## 2022-03-14 MED ORDER — ROSUVASTATIN CALCIUM 10 MG PO TABS: 10.0000 mg | ORAL_TABLET | Freq: Every day | ORAL | 3 refills | Status: DC

## 2022-03-14 NOTE — Progress Notes (Signed)
Patient Care Team: Laurey Morale, MD as PCP - General (Family Medicine) Deboraha Sprang, MD as Consulting Physician (Cardiology)   HPI  Jeremy [name] is a 72 y.o. male Seen in followup for syncope.   History of coronary artery disease -- distal RCA   treated with an Taxus drug-eluting stent after his first syncopal episode.. Alos proximal RCA and of his LAD.  2007  Repeat LHC 2012 with mild ISR; EF 55% age at that time was about 68  I have reviewed his catheterization with Dr. Billee Cashing. The plan will be long term DAPT   The patient denies chest pain, shortness of breath, nocturnal dyspnea, orthopnea or peripheral edema.  There have been no palpitations, lightheadedness or syncope.    Walks 3+ days a week 3+ miles a time  Date Cr K LDL  11/18 0.91 4.9   3/20  0.92 4.4 74  7/21 0.87 4.6 ?,< 60   7/22   92  9/23 0.94 4.3 46     Past Medical History:  Diagnosis Date   Colon polyp 07/05/2007   TUBULAR ADENOMA   Coronary artery disease    Cardiac catheterization 8/07: Proximal LAD 50-75%, mid LAD 60%, ostial septal perforator 80%, proximal RCA 50% and then 80-85%, distal RCA 80-90%, EF 55-60%.  PCI was performed at that time colon distal RCA treated with a Taxus drug-eluting stent.  The proximal RCA treated with a Taxus drug eluting stent.  The LAD was studied with IVUS and treated with a Taxus drug eluding stent.     Hyperlipidemia    Ill-defined closed fractures of upper limb    Sinus bradycardia    sees Dr. Caryl Comes    Syncope     Past Surgical History:  Procedure Laterality Date   APPENDECTOMY     COLONOSCOPY  06-20-13   per Dr. Carlean Purl, clear, repeat in 10 yrs    CORONARY ANGIOPLASTY      Current Outpatient Medications  Medication Sig Dispense Refill   aspirin 81 MG tablet Take 81 mg by mouth daily.     clopidogrel (PLAVIX) 75 MG tablet Take 1 tablet (75 mg total) by mouth daily. 90 tablet 3   COVID-19 mRNA bivalent vaccine, Pfizer, (PFIZER COVID-19 VAC BIVALENT)  injection Inject into the muscle. 0.3 mL 0   ezetimibe (ZETIA) 10 MG tablet Take 1 tablet (10 mg total) by mouth daily. 90 tablet 3   fluticasone (FLONASE) 50 MCG/ACT nasal spray Place 2 sprays into the nose as directed. 48 g 3   rosuvastatin (CRESTOR) 20 MG tablet Take 1 tablet (20 mg total) by mouth daily. 90 tablet 3   sildenafil (VIAGRA) 100 MG tablet Take 1 tablet (100 mg total) by mouth as needed for erectile dysfunction. 10 tablet 5   No current facility-administered medications for this visit.    No Known Allergies  Review of Systems negative except from HPI and PMH  Physical Exam BP (!) 154/76   Pulse (!) 51   Ht 5' 10"$  (1.778 m)   Wt 183 lb (83 kg)   SpO2 96%   BMI 26.26 kg/m  Well developed and nourished in no acute distress HENT normal Neck supple with JVP-  flat   Clear Regular rate and rhythm, no murmurs or gallops Abd-soft with active BS No Clubbing cyanosis edema Skin-warm and dry A & Oriented  Grossly normal sensory and motor function  ECG sinus at 51 Interval 16/10/42  Assessment and  Plan  Syncope  Coronary artery disease status post Taxus stenting RCA/LAD 2007--discussion with [facility] long term DAPT  Hyperlipidemia  Sinus bradycardia  No interval syncope.  Bradycardic is asymptomatic.  LDL is at goal.  He would like to reduce the medicine.  Asked about stopping ezetimibe, I suggested no.  I did offer him cutting his rosuvastatin in half, he would like to do that.  We will do so and check a lipid profile in about 4 months.  Blood pressure is elevated.  Will start him on hydrochlorothiazide 12.5 mg.  Side effects reviewed and we will check a BMET in 2 weeks.

## 2022-03-14 NOTE — Patient Instructions (Signed)
Medication Instructions:  Your physician has recommended you make the following change in your medication:   ** Decrease Rosuvastatin to 73m - 1 tablet by mouth daily..  **  Begin HCTZ 12.542m- 1 capsule by mouth daily  *If you need a refill on your cardiac medications before your next appointment, please call your pharmacy*   Lab Work: BMET in 2 weeks  Lipid Panel in 4 months  If you have labs (blood work) drawn today and your tests are completely normal, you will receive your results only by: [facility] [place] you have MyChart) OR A paper copy in the mail If you have any lab test that is abnormal or we need to change your treatment, we will call you to review the results.   Testing/Procedures: None ordered.    Follow-Up: At [facility] and your [facility] needs are our priority.  As part of our continuing mission to provide you with exceptional heart care, we have created designated Provider Care Teams.  These Care Teams include your primary Cardiologist (physician) and Advanced Practice Providers (APPs -  Physician Assistants and Nurse Practitioners) who all work together to provide you with the care you need, when you need it.  We recommend signing up for the patient portal called "MyChart".  Sign up information is provided on this After Visit Summary.  MyChart is used to connect with patients for Virtual Visits (Telemedicine).  Patients are able to view lab/test results, encounter notes, upcoming appointments, etc.  Non-urgent messages can be sent to your provider as well.   To learn more about what you can do with MyChart, go to [email or web address]   Your next appointment:   12 months with Dr KlCaryl Comes

## 2022-03-15 ENCOUNTER — Telehealth: Payer: Self-pay | Admitting: Family Medicine

## 2022-03-15 NOTE — Telephone Encounter (Signed)
I believe this was meant for Dr. Caryl Comes

## 2022-03-15 NOTE — Telephone Encounter (Signed)
Spoke with patient to schedule awv  He wanted a call back 03/16/22  he wasn't around his calendar

## 2022-03-30 ENCOUNTER — Ambulatory Visit: Payer: Medicare HMO | Attending: Internal Medicine

## 2022-03-30 DIAGNOSIS — Z79899 Other long term (current) drug therapy: Secondary | ICD-10-CM | POA: Diagnosis not present

## 2022-03-30 DIAGNOSIS — E785 Hyperlipidemia, unspecified: Secondary | ICD-10-CM

## 2022-03-30 DIAGNOSIS — R55 Syncope and collapse: Secondary | ICD-10-CM | POA: Diagnosis not present

## 2022-03-30 DIAGNOSIS — R001 Bradycardia, unspecified: Secondary | ICD-10-CM

## 2022-03-31 LAB — BASIC METABOLIC PANEL
BUN/Creatinine Ratio: 23 (ref 10–24)
BUN: 19 mg/dL (ref 8–27)
CO2: 27 mmol/L (ref 20–29)
Calcium: 9.3 mg/dL (ref 8.6–10.2)
Chloride: 101 mmol/L (ref 96–106)
Creatinine, Ser: 0.83 mg/dL (ref 0.76–1.27)
Glucose: 101 mg/dL — ABNORMAL HIGH (ref 70–99)
Potassium: 4.9 mmol/L (ref 3.5–5.2)
Sodium: 142 mmol/L (ref 134–144)
eGFR: 93 mL/min/{1.73_m2} (ref >59)

## 2022-04-14 ENCOUNTER — Telehealth (INDEPENDENT_AMBULATORY_CARE_PROVIDER_SITE_OTHER): Payer: Medicare HMO | Admitting: Family Medicine

## 2022-04-14 DIAGNOSIS — Z0000 Encounter for general adult medical examination without abnormal findings: Secondary | ICD-10-CM | POA: Diagnosis not present

## 2022-04-14 NOTE — Patient Instructions (Signed)
I really enjoyed getting to talk with you today! I am available on Tuesdays and Thursdays for virtual visits if you have any questions or concerns, or if I can be of any further assistance.   CHECKLIST FROM ANNUAL WELLNESS VISIT:  -Follow up (please call to schedule if not scheduled after visit):   -yearly for annual wellness visit with primary care office  Here is a list of your preventive care/health maintenance measures and the plan for each if any are due:  Health Maintenance  Topic Date Due   Zoster Vaccines- Shingrix (1 of 2) Never done   Pneumonia Vaccine 32+ Years old (3 of 3 - PPSV23 or PCV20) 04/19/2018   DTaP/Tdap/Td (2 - Tdap) 03/04/2019   COVID-19 Vaccine (6 - 2023-24 season) 10/02/2022 (Originally 12/17/2021)   Medicare Annual Wellness ([place])  04/14/2023   COLONOSCOPY (Pts 45-69yr Insurance coverage will need to be confirmed)  06/21/2023   INFLUENZA VACCINE  Completed   Hepatitis C Screening  Completed   HPV VACCINES  Aged Out    -See a dentist at least yearly  -Get your eyes checked and then per your eye specialist's recommendations  -Other issues addressed today:  -I have included below further information regarding a healthy whole foods based diet, physical activity guidelines for adults, stress management and opportunities for social connections. I hope you find this information useful.   -----------------------------------------------------------------------------------------------------------------------------------------------------------------------------------------------------------------------------------------------------------  NUTRITION: -eat real food: lots of colorful vegetables (half the plate) and fruits -5-7 servings of vegetables and fruits per day (fresh or steamed is best), exp. 2 servings of vegetables with lunch and dinner and 2 servings of fruit per day. Berries and greens such as kale and collards are great choices.  -consume on a regular  basis: whole grains (make sure first ingredient on label contains the word "whole"), fresh fruits, fish, nuts, seeds, healthy oils (such as olive oil, avocado oil, grape seed oil) -may eat small amounts of dairy and lean meat on occasion, but avoid processed meats such as ham, bacon, lunch meat, etc. -drink water -try to avoid fast food and pre-packaged foods, processed meat -most experts advise limiting sodium to < '2300mg'$  per day, should limit further is any chronic conditions such as high blood pressure, heart disease, diabetes, etc. The American Heart Association advised that < '1500mg'$  is is ideal -try to avoid foods that contain any ingredients with names you do not recognize  -try to avoid sugar/sweets (except for the natural sugar that occurs in fresh fruit) -try to avoid sweet drinks -try to avoid white rice, white bread, pasta (unless whole grain), white or yellow potatoes  EXERCISE GUIDELINES FOR ADULTS: -if you wish to increase your physical activity, do so gradually and with the approval of your doctor -STOP and seek medical care immediately if you have any chest pain, chest discomfort or trouble breathing when starting or increasing exercise  -move and stretch your body, legs, feet and arms when sitting for long periods -Physical activity guidelines for optimal health in adults: -least 150 minutes per week of aerobic exercise (can talk, but not sing) once approved by your doctor, 20-30 minutes of sustained activity or two 10 minute episodes of sustained activity every day.  -resistance training at least 2 days per week if approved by your doctor -balance exercises 3+ days per week:   Stand somewhere where you have something sturdy to hold onto if you lose balance.    1) lift up on toes, start with 5x per day and work  up to 20x   2) stand and lift on leg straight out to the side so that foot is a few inches of the floor, start with 5x each side and work up to 20x each side   3) stand  on one foot, start with 5 seconds each side and work up to 20 seconds on each side  If you need ideas or help with getting more active:  -Silver sneakers https://tools.silversneakers.com  -Walk with a Doc: http://stephens-thompson.biz/  -try to include resistance (weight lifting/strength building) and balance exercises twice per week: or the following link for ideas: ChessContest.fr  UpdateClothing.com.cy  STRESS MANAGEMENT: -can try meditating, or just sitting quietly with deep breathing while intentionally relaxing all parts of your body for 5 minutes daily -if you need further help with stress, anxiety or depression please follow up with your primary doctor or contact the wonderful folks at [place]: [place]: -options in [place] if you wish to engage in more social and exercise related activities:  -Silver sneakers https://tools.silversneakers.com  -Walk with a Doc: http://stephens-thompson.biz/  -Check out the [place] 50+ section on the [place] [employer] (hiking clubs, book clubs, cards and games, chess, exercise classes, aquatic classes and much more) - see the website for details: https://www.[place].gov/departments/parks-recreation/active-adults50  -YouTube has lots of exercise videos for different ages and abilities as well  -[place] (a variety of indoor and outdoor inperson activities for adults). [phone]. [address]  -Virtual Online Classes (a variety of topics): see seniorplanet.org or call [phone]  -consider volunteering at a school, hospice center, church, senior center or elsewhere

## 2022-04-14 NOTE — Progress Notes (Signed)
PATIENT CHECK-IN and HEALTH RISK ASSESSMENT QUESTIONNAIRE:  -completed by phone/video for upcoming Medicare Preventive Visit  Pre-Visit Check-in: 1)Vitals (height, wt, BP, etc) - record in vitals section for visit on day of visit 2)Review and Update Medications, Allergies PMH, Surgeries, Social history in Epic 3)Hospitalizations in the last year with date/reason? No 4)Review and Update Care Team (patient's specialists) in Epic 5) Complete PHQ9 in Epic  6) Complete Fall Screening in Epic 7)Review all Health Maintenance Due and order under PCP if not done.  Medicare Wellness Patient Questionnaire:  Answer theses question about your habits: Do you drink alcohol? Yes  If yes, how many drinks do you have a day?few times per month Have you ever smoked?Yes   Quit date if applicable?  25 years  How many packs a day do/did you smoke? 2 packs per day Do you use smokeless tobacco?No Do you use an illicit drugs?No Do you exercises? Yes  IF so, what type and how many days/minutes per week?Walks on treadmill-45 mins 3 days per weeks.   Walk in park 2 days per week for 3 or 4 miles Are you sexually active? Yes Number of partners?1 Typical breakfast: Oatmeal, toast with ham Typical lunch: sandwich Typical dinner: protein, vegetable, sometime a starch Typical snacks:fruit  Beverages: water, coffee, soda  Answer theses question about you: Can you perform most household chores?Yes Do you find it hard to follow a conversation in a noisy room?No Do you often ask people to speak up or repeat themselves?No Do you feel that you have a problem with memory?No Do you balance your checkbook and or bank acounts?Yes Do you feel safe at home?Yes Last dentist visit?12 months ago Do you need assistance with any of the following: Please note if so   Driving?-No   Feeding yourself?-No  Getting from bed to chair?-No  Getting to the toilet?-No  Bathing or showering?-No  Dressing yourself?-No  Managing  money?-No  Climbing a flight of stairs-No  Preparing meals?-No    Do you have Advanced Directives in place (Living Will, Healthcare Power or Attorney)? Yes   Last eye Exam and location?1 1/2 year ago.  [place], at [facility] you currently use prescribed or non-prescribed narcotic or opioid pain medications?No  Do you have a history or close family history of breast, ovarian, tubal or peritoneal cancer or a family member with BRCA (breast cancer susceptibility 1 and 2) gene mutations?  Nurse/Assistant Credentials/time stamp:St   ----------------------------------------------------------------------------------------------------------------------------------------------------------------------------------------------------------------------    MEDICARE ANNUAL PREVENTIVE CARE VISIT WITH PROVIDER (Welcome to Medicare, initial annual wellness or annual wellness exam)  Virtual Visit via Video Note  I connected with Jeremy [name] on 04/14/22  by  a video enabled telemedicine application and verified that I am speaking with the correct person using two identifiers.  Location patient: home Location provider:work or home office Persons participating in the virtual visit: patient, provider  Concerns and/or follow up today: doing well, no concerns today   See HM section in Epic for other details of completed HM.    ROS: negative for report of fevers, unintentional weight loss, vision changes, vision loss, hearing loss or change, chest pain, sob, hemoptysis, melena, hematochezia, hematuria, falls, bleeding or bruising, loc, thoughts of suicide or self harm, memory loss  Patient-completed extensive health risk assessment - reviewed and discussed with the patient: See Health Risk Assessment completed with patient prior to the visit either above or in recent phone note. This was reviewed in detailed with the patient today  and appropriate recommendations, orders and referrals were  placed as needed per Summary below and patient instructions.   Review of Medical History: -PMH, PSH, Family History and current specialty and care providers reviewed and updated and listed below   Patient Care Team: Laurey Morale, MD as PCP - General (Family Medicine) Deboraha Sprang, MD as Consulting Physician (Cardiology)   Past Medical History:  Diagnosis Date   Colon polyp 07/05/2007   TUBULAR ADENOMA   Coronary artery disease    Cardiac catheterization 8/07: Proximal LAD 50-75%, mid LAD 60%, ostial septal perforator 80%, proximal RCA 50% and then 80-85%, distal RCA 80-90%, EF 55-60%.  PCI was performed at that time colon distal RCA treated with a Taxus drug-eluting stent.  The proximal RCA treated with a Taxus drug eluting stent.  The LAD was studied with IVUS and treated with a Taxus drug eluding stent.     Hyperlipidemia    Ill-defined closed fractures of upper limb    Sinus bradycardia    sees Dr. Caryl Comes    Syncope     Past Surgical History:  Procedure Laterality Date   APPENDECTOMY     COLONOSCOPY  06-20-13   per Dr. Carlean Purl, clear, repeat in 10 yrs    CORONARY ANGIOPLASTY      Social History   Socioeconomic History   Marital status: Married    Spouse name: Not on file   Number of children: 0   Years of education: 16   Highest education level: Not on file  Occupational History   Occupation: Market researcher: [employer]    Comment: Full time  Tobacco Use   Smoking status: Former   Smokeless tobacco: Never  Scientific laboratory technician Use: Never used  Substance and Sexual Activity   Alcohol use: Yes    Alcohol/week: 0.0 standard drinks of alcohol    Comment: occ   Drug use: No   Sexual activity: Yes    Partners: Female  Other Topics Concern   Not on file  Social History Narrative   HSG, [place]. Married ''89. No children. Occupation: Network engineer      04/02/2018: Lives with wife in 3 story house   Has family scattered  around the [place]    Works FT as Network engineer, enjoys working still   [employer] to gym 3days/week   Enjoys reading   Social Determinants of Radio broadcast assistant Strain: Low Risk  (04/12/2021)   Overall Financial Resource Strain (CARDIA)    Difficulty of Paying Living Expenses: Not hard at all  Food Insecurity: No Food Insecurity (04/12/2021)   Hunger Vital Sign    Worried About Running Out of Food in the Last Year: Never true    [place] in the Last Year: Never true  Transportation Needs: No Transportation Needs (04/12/2021)   PRAPARE - Hydrologist (Medical): No    Lack of Transportation (Non-Medical): No  Physical Activity: Sufficiently Active (04/12/2021)   Exercise Vital Sign    Days of Exercise per Week: 3 days    Minutes of Exercise per Session: 50 min  Stress: No Stress Concern Present (04/12/2021)   [place]    Feeling of Stress : Not at all  Social Connections: Moderately Integrated (05/07/2020)   Social Connection and Isolation Panel [NHANES]    Frequency of Communication with Friends and Family: More  than three times a week    Frequency of Social Gatherings with Friends and Family: More than three times a week    Attends Religious Services: Never    Marine scientist or Organizations: Yes    Attends Music therapist: More than 4 times per year    Marital Status: Married  Human resources officer Violence: Not At Risk (05/07/2020)   Humiliation, Afraid, Rape, and Kick questionnaire    Fear of Current or Ex-Partner: No    Emotionally Abused: No    Physically Abused: No    Sexually Abused: No    Family History  Problem Relation Age of Onset   Prostate cancer Father    Cancer Father        prostate cancer w/ mets   Heart disease Father        CAD   Hyperlipidemia Father    Stroke Mother    Colon cancer Maternal Uncle 47   Heart attack Paternal  Grandmother    COPD Neg Hx    Diabetes Neg Hx     Current Outpatient Medications on File Prior to Visit  Medication Sig Dispense Refill   aspirin 81 MG tablet Take 81 mg by mouth daily.     clopidogrel (PLAVIX) 75 MG tablet Take 1 tablet (75 mg total) by mouth daily. 90 tablet 3   COVID-19 mRNA bivalent vaccine, Pfizer, (PFIZER COVID-19 VAC BIVALENT) injection Inject into the muscle. 0.3 mL 0   ezetimibe (ZETIA) 10 MG tablet Take 1 tablet (10 mg total) by mouth daily. 90 tablet 3   fluticasone (FLONASE) 50 MCG/ACT nasal spray Place 2 sprays into the nose as directed. 48 g 3   hydrochlorothiazide (MICROZIDE) 12.5 MG capsule Take 1 capsule (12.5 mg total) by mouth daily. 90 capsule 3   rosuvastatin (CRESTOR) 10 MG tablet Take 1 tablet (10 mg total) by mouth daily. 90 tablet 3   sildenafil (VIAGRA) 100 MG tablet Take 1 tablet (100 mg total) by mouth as needed for erectile dysfunction. 10 tablet 5   No current facility-administered medications on file prior to visit.    No Known Allergies     Physical Exam There were no vitals filed for this visit. Estimated body mass index is 26.26 kg/m as calculated from the following:   Height as of 03/14/22: '5\' 10"'$  (1.778 m).   Weight as of 03/14/22: 183 lb (83 kg).  EKG (optional): deferred due to virtual visit  GENERAL: alert, oriented, no acute distress detected; full vision exam deferred due to pandemic and/or virtual encounter   HEENT: atraumatic, conjunttiva clear, no obvious abnormalities on inspection of external nose and ears  NECK: normal movements of the head and neck  LUNGS: on inspection no signs of respiratory distress, breathing rate appears normal, no obvious gross SOB, gasping or wheezing  CV: no obvious cyanosis  MS: moves all visible extremities without noticeable abnormality  PSYCH/NEURO: pleasant and cooperative, no obvious depression or anxiety, speech and thought processing grossly intact, Cognitive function grossly  intact  Flowsheet Row Video Visit from 04/14/2022 in [place] at [facility]  PHQ-9 Total Score 1           04/14/2022   10:04 AM 10/08/2021    8:19 AM 04/12/2021    4:08 PM 05/07/2020   11:22 AM 04/02/2018    2:30 PM  Depression screen PHQ 2/9  Decreased Interest 0 0 0 0 0  Down, Depressed, Hopeless 0 0 0 0 0  PHQ - 2 Score 0 0 0 0 0  Altered sleeping 1 2   0  Tired, decreased energy 0 0   0  Change in appetite 0 0   0  Feeling bad or failure about yourself  0 0   0  Trouble concentrating 0 0   0  Moving slowly or fidgety/restless 0 0   0  Suicidal thoughts 0 0   0  PHQ-9 Score 1 2   0  Difficult doing work/chores Not difficult at all Not difficult at all          08/25/2020    9:23 AM 04/12/2021    4:07 PM 08/27/2021   10:42 AM 10/08/2021    8:19 AM 04/14/2022   10:04 AM  Fall Risk  Falls in the past year? 0 0  1 0  Was there an injury with Fall?    0 0  Fall Risk Category Calculator    1 0  Fall Risk Category (Retired)    Low   (RETIRED) Patient Fall Risk Level  Low fall risk Low fall risk Low fall risk   Patient at Risk for Falls Due to  Medication side effect  No Fall Risks   Fall risk Follow up  Falls evaluation completed;Education provided;Falls prevention discussed  Falls evaluation completed      SUMMARY AND PLAN:  Encounter for Medicare annual wellness exam   Discussed applicable health maintenance/preventive health measures and advised and referred or ordered per patient preferences: Discussed all - he plans to get the vaccines at the pharmacy/and or office soon.  Health Maintenance  Topic Date Due   Zoster Vaccines- Shingrix (1 of 2) Never done   Pneumonia Vaccine 72+ Years old (3 of 3 - PPSV23 or PCV20) 04/19/2018   DTaP/Tdap/Td (2 - Tdap) 03/04/2019   COVID-19 Vaccine (6 - 2023-24 season) 10/02/2022 (Originally 12/17/2021)   Medicare Annual Wellness (AWV)  04/14/2023   COLONOSCOPY (Pts 45-50yr Insurance coverage will need to be  confirmed)  06/21/2023   INFLUENZA VACCINE  Completed   Hepatitis C Screening  Completed   HPV VACCINES  Aged ODu Pontand counseling on the following was provided based on the above review of health and a plan/checklist for the patient, along with additional information discussed, was provided for the patient in the patient instructions :   -Advised and counseled on maintaining healthy weight and healthy lifestyle. -Advised and counseled on a whole foods based healthy diet and regular exercise: discussed a heart healthy whole foods based diet at length. A summary of a healthy diet was provided in the Patient Instructions. -Recommended and congratulated on regular exercise and discussed exercise guidelines and options within the community. Encouraged to include some strength training a few days per week.  -Advise yearly dental visits at minimum and regular eye exams -Advised and counseled on alcohol use/limits.   Follow up: see patient instructions   Patient Instructions  I really enjoyed getting to talk with you today! I am available on Tuesdays and Thursdays for virtual visits if you have any questions or concerns, or if I can be of any further assistance.   CHECKLIST FROM ANNUAL WELLNESS VISIT:  -Follow up (please call to schedule if not scheduled after visit):   -yearly for annual wellness visit with primary care office  Here is a list of your preventive care/health maintenance measures and the plan for each if any are due:  Health Maintenance  Topic Date Due  Zoster Vaccines- Shingrix (1 of 2) Never done   Pneumonia Vaccine 35+ Years old (3 of 3 - PPSV23 or PCV20) 04/19/2018   DTaP/Tdap/Td (2 - Tdap) 03/04/2019   COVID-19 Vaccine (6 - 2023-24 season) 10/02/2022 (Originally 12/17/2021)   Medicare Annual Wellness (AWV)  04/14/2023   COLONOSCOPY (Pts 45-35yr Insurance coverage will need to be confirmed)  06/21/2023   INFLUENZA VACCINE  Completed   Hepatitis C Screening   Completed   HPV VACCINES  Aged Out    -See a dentist at least yearly  -Get your eyes checked and then per your eye specialist's recommendations  -Other issues addressed today:  -I have included below further information regarding a healthy whole foods based diet, physical activity guidelines for adults, stress management and opportunities for social connections. I hope you find this information useful.   -----------------------------------------------------------------------------------------------------------------------------------------------------------------------------------------------------------------------------------------------------------  NUTRITION: -eat real food: lots of colorful vegetables (half the plate) and fruits -5-7 servings of vegetables and fruits per day (fresh or steamed is best), exp. 2 servings of vegetables with lunch and dinner and 2 servings of fruit per day. Berries and greens such as kale and collards are great choices.  -consume on a regular basis: whole grains (make sure first ingredient on label contains the word "whole"), fresh fruits, fish, nuts, seeds, healthy oils (such as olive oil, avocado oil, grape seed oil) -may eat small amounts of dairy and lean meat on occasion, but avoid processed meats such as ham, bacon, lunch meat, etc. -drink water -try to avoid fast food and pre-packaged foods, processed meat -most experts advise limiting sodium to < '2300mg'$  per day, should limit further is any chronic conditions such as high blood pressure, heart disease, diabetes, etc. The American Heart Association advised that < '1500mg'$  is is ideal -try to avoid foods that contain any ingredients with names you do not recognize  -try to avoid sugar/sweets (except for the natural sugar that occurs in fresh fruit) -try to avoid sweet drinks -try to avoid white rice, white bread, pasta (unless whole grain), white or yellow potatoes  EXERCISE GUIDELINES FOR ADULTS: -if  you wish to increase your physical activity, do so gradually and with the approval of your doctor -STOP and seek medical care immediately if you have any chest pain, chest discomfort or trouble breathing when starting or increasing exercise  -move and stretch your body, legs, feet and arms when sitting for long periods -Physical activity guidelines for optimal health in adults: -least 150 minutes per week of aerobic exercise (can talk, but not sing) once approved by your doctor, 20-30 minutes of sustained activity or two 10 minute episodes of sustained activity every day.  -resistance training at least 2 days per week if approved by your doctor -balance exercises 3+ days per week:   Stand somewhere where you have something sturdy to hold onto if you lose balance.    1) lift up on toes, start with 5x per day and work up to 20x   2) stand and lift on leg straight out to the side so that foot is a few inches of the floor, start with 5x each side and work up to 20x each side   3) stand on one foot, start with 5 seconds each side and work up to 20 seconds on each side  If you need ideas or help with getting more active:  -Silver sneakers https://tools.silversneakers.com  -Walk with a Doc: Hhttp://stephens-thompson.biz/ -try to include resistance (weight lifting/strength building) and balance exercises twice  per week: or the following link for ideas: ChessContest.fr  UpdateClothing.com.cy  STRESS MANAGEMENT: -can try meditating, or just sitting quietly with deep breathing while intentionally relaxing all parts of your body for 5 minutes daily -if you need further help with stress, anxiety or depression please follow up with your primary doctor or contact the wonderful folks at [place]: [place]: -options in [place] if you wish to engage in more social and  exercise related activities:  -Silver sneakers https://tools.silversneakers.com  -Walk with a Doc: http://stephens-thompson.biz/  -Check out the [place] 50+ section on the [place] [employer] (hiking clubs, book clubs, cards and games, chess, exercise classes, aquatic classes and much more) - see the website for details: https://www.[place].gov/departments/parks-recreation/active-adults50  -YouTube has lots of exercise videos for different ages and abilities as well  -[place] (a variety of indoor and outdoor inperson activities for adults). (470)323-2375. [address].  -Virtual Online Classes (a variety of topics): see seniorplanet.org or call 559-035-7546  -consider volunteering at a school, hospice center, church, senior center or elsewhere           Lucretia Kern, DO

## 2022-05-31 DIAGNOSIS — Z0100 Encounter for examination of eyes and vision without abnormal findings: Secondary | ICD-10-CM | POA: Diagnosis not present

## 2022-05-31 DIAGNOSIS — H5212 Myopia, left eye: Secondary | ICD-10-CM | POA: Diagnosis not present

## 2022-07-13 ENCOUNTER — Ambulatory Visit: Payer: Medicare HMO | Attending: Internal Medicine

## 2022-07-13 DIAGNOSIS — R001 Bradycardia, unspecified: Secondary | ICD-10-CM | POA: Diagnosis not present

## 2022-07-13 DIAGNOSIS — R55 Syncope and collapse: Secondary | ICD-10-CM

## 2022-07-13 DIAGNOSIS — Z79899 Other long term (current) drug therapy: Secondary | ICD-10-CM | POA: Diagnosis not present

## 2022-07-13 DIAGNOSIS — E785 Hyperlipidemia, unspecified: Secondary | ICD-10-CM

## 2022-07-14 LAB — LIPID PANEL
Chol/HDL Ratio: 2.3 ratio (ref 0.0–5.0)
Cholesterol, Total: 107 mg/dL (ref 100–199)
HDL: 46 mg/dL (ref >39)
LDL Chol Calc (NIH): 46 mg/dL (ref 0–99)
Triglycerides: 68 mg/dL (ref 0–149)
VLDL Cholesterol Cal: 15 mg/dL (ref 5–40)

## 2022-09-06 ENCOUNTER — Encounter: Payer: Self-pay | Admitting: Family Medicine

## 2022-09-06 ENCOUNTER — Telehealth (INDEPENDENT_AMBULATORY_CARE_PROVIDER_SITE_OTHER): Payer: Medicare HMO | Admitting: Family Medicine

## 2022-09-06 VITALS — Wt 183.0 lb

## 2022-09-06 DIAGNOSIS — U071 COVID-19: Secondary | ICD-10-CM

## 2022-09-06 MED ORDER — NIRMATRELVIR/RITONAVIR (PAXLOVID)TABLET: 3.0000 | ORAL_TABLET | Freq: Two times a day (BID) | ORAL | 0 refills | Status: AC

## 2022-09-06 NOTE — Progress Notes (Signed)
Subjective:    Patient ID: Jeremy [name], male    DOB: 21-Aug-1950, 72 y.o.   MRN: [id number]  HPI Virtual Visit via Video Note  I connected with the patient on 09/06/22 at 11:00 AM EDT by a video enabled telemedicine application and verified that I am speaking with the correct person using two identifiers.  Location patient: home Location provider:work or home office Persons participating in the virtual visit: patient, provider  I discussed the limitations of evaluation and management by telemedicine and the availability of in person appointments. The patient expressed understanding and agreed to proceed.   HPI: Here for the onset yesterday of fever to 100.1 degrees, body aches, ST, and a dry cough. He feels mildly SOB at times. He tested positive for the Covid virus last night. Drinking fluids and taking Tylenol.    ROS: See pertinent positives and negatives per HPI.  Past Medical History:  Diagnosis Date   Colon polyp 07/05/2007   TUBULAR ADENOMA   Coronary artery disease    Cardiac catheterization 8/07: Proximal LAD 50-75%, mid LAD 60%, ostial septal perforator 80%, proximal RCA 50% and then 80-85%, distal RCA 80-90%, EF 55-60%.  PCI was performed at that time colon distal RCA treated with a Taxus drug-eluting stent.  The proximal RCA treated with a Taxus drug eluting stent.  The LAD was studied with IVUS and treated with a Taxus drug eluding stent.     Hyperlipidemia    Ill-defined closed fractures of upper limb    Sinus bradycardia    sees Dr. Graciela Husbands    Syncope     Past Surgical History:  Procedure Laterality Date   APPENDECTOMY     COLONOSCOPY  06-20-13   per Dr. Leone Payor, clear, repeat in 10 yrs    CORONARY ANGIOPLASTY      Family History  Problem Relation Age of Onset   Prostate cancer Father    Cancer Father        prostate cancer w/ mets   Heart disease Father        CAD   Hyperlipidemia Father    Stroke Mother    Colon cancer Maternal Uncle 60   Heart  attack Paternal Grandmother    COPD Neg Hx    Diabetes Neg Hx      Current Outpatient Medications:    aspirin 81 MG tablet, Take 81 mg by mouth daily., Disp: , Rfl:    clopidogrel (PLAVIX) 75 MG tablet, Take 1 tablet (75 mg total) by mouth daily., Disp: 90 tablet, Rfl: 3   COVID-19 mRNA bivalent vaccine, Pfizer, (PFIZER COVID-19 VAC BIVALENT) injection, Inject into the muscle., Disp: 0.3 mL, Rfl: 0   ezetimibe (ZETIA) 10 MG tablet, Take 1 tablet (10 mg total) by mouth daily., Disp: 90 tablet, Rfl: 3   fluticasone (FLONASE) 50 MCG/ACT nasal spray, Place 2 sprays into the nose as directed., Disp: 48 g, Rfl: 3   hydrochlorothiazide (MICROZIDE) 12.5 MG capsule, Take 1 capsule (12.5 mg total) by mouth daily., Disp: 90 capsule, Rfl: 3   rosuvastatin (CRESTOR) 10 MG tablet, Take 1 tablet (10 mg total) by mouth daily., Disp: 90 tablet, Rfl: 3   sildenafil (VIAGRA) 100 MG tablet, Take 1 tablet (100 mg total) by mouth as needed for erectile dysfunction., Disp: 10 tablet, Rfl: 5  EXAM:  VITALS per patient if applicable:  GENERAL: alert, oriented, appears well and in no acute distress  HEENT: atraumatic, conjunttiva clear, no obvious abnormalities on inspection of external nose  and ears  NECK: normal movements of the head and neck  LUNGS: on inspection no signs of respiratory distress, breathing rate appears normal, no obvious gross SOB, gasping or wheezing  CV: no obvious cyanosis  MS: moves all visible extremities without noticeable abnormality  PSYCH/NEURO: pleasant and cooperative, no obvious depression or anxiety, speech and thought processing grossly intact  ASSESSMENT AND PLAN: Covid infection, treat with 5 days of Paxlovid. Recheck as needed. Gershon Crane, MD  Discussed the following assessment and plan:  No diagnosis found.     I discussed the assessment and treatment plan with the patient. The patient was provided an opportunity to ask questions and all were answered. The  patient agreed with the plan and demonstrated an understanding of the instructions.   The patient was advised to call back or seek an in-person evaluation if the symptoms worsen or if the condition fails to improve as anticipated.      Review of Systems     Objective:   Physical Exam        Assessment & Plan:

## 2022-10-26 ENCOUNTER — Other Ambulatory Visit: Payer: Self-pay | Admitting: Family Medicine

## 2022-11-16 ENCOUNTER — Other Ambulatory Visit: Payer: Self-pay | Admitting: Family Medicine

## 2022-11-16 DIAGNOSIS — E785 Hyperlipidemia, unspecified: Secondary | ICD-10-CM

## 2022-12-13 ENCOUNTER — Other Ambulatory Visit: Payer: Self-pay | Admitting: Family Medicine

## 2022-12-16 ENCOUNTER — Telehealth: Payer: Self-pay | Admitting: Internal Medicine

## 2022-12-16 NOTE — Telephone Encounter (Signed)
Refused. Pt requested RX too soon

## 2022-12-16 NOTE — Telephone Encounter (Signed)
*  STAT* If patient is at the pharmacy, call can be transferred to refill team.   1. Which medications need to be refilled? (please list name of each medication and dose if known) rosuvastatin (CRESTOR) 10 MG tablet   2. Which pharmacy/location (including street and city if local pharmacy) is medication to be sent to?rosuvastatin (CRESTOR) 10 MG tablet   3. Do they need a 30 day or 90 day supply? 30  Pt only has 4 pills left so he wants a 30 day sent to local pharmacy, then sent a script to mail order.

## 2022-12-19 ENCOUNTER — Telehealth: Payer: Self-pay | Admitting: Internal Medicine

## 2022-12-19 MED ORDER — ROSUVASTATIN CALCIUM 10 MG PO TABS: 10.0000 mg | ORAL_TABLET | Freq: Every day | ORAL | 0 refills | Status: DC

## 2022-12-19 NOTE — Telephone Encounter (Signed)
 RX sent to requested Pharmacy

## 2022-12-19 NOTE — Telephone Encounter (Signed)
*  STAT* If patient is at the pharmacy, call can be transferred to refill team.   1. Which medications need to be refilled? (please list name of each medication and dose if known) rosuvastatin (CRESTOR) 10 MG tablet    2. Would you like to learn more about the convenience, safety, & potential cost savings by using the [facility]? No      3. Are you open to using the [facility] (Type [facility]. No  ).   4. Which pharmacy/location (including street and city if local pharmacy) is medication to be sent to? [id number] - [place], [place] - [address]    5. Do they need a 30 day or 90 day supply? 90

## 2022-12-20 ENCOUNTER — Telehealth: Payer: Self-pay | Admitting: Internal Medicine

## 2022-12-20 NOTE — Telephone Encounter (Signed)
Spoke with pt and advised he is to be taking Rosuvastatin 10mg  - 1 tablet by mouth daily according to Rx.  Pt advised [facility]  Lab work will need to be repeated at his February OV.  Pt verbalizes understanding and agrees with current plan.

## 2022-12-20 NOTE — Telephone Encounter (Signed)
Pt c/o medication issue:  1. Name of Medication:   rosuvastatin (CRESTOR) 10 MG tablet   2. How are you currently taking this medication (dosage and times per day)?   Patient stated he has been taking 20 mg daily  3. Are you having a reaction (difficulty breathing--STAT)?  No  4. What is your medication issue?   Patient stated he has been taking 20 mg daily and wants to confirm which dosage of this medication he should be taking.  Patient wants 90 day refill also sent to [facility].

## 2022-12-21 ENCOUNTER — Ambulatory Visit (HOSPITAL_COMMUNITY): Payer: Medicare HMO

## 2022-12-21 ENCOUNTER — Ambulatory Visit (HOSPITAL_COMMUNITY)
Admission: EM | Admit: 2022-12-21 | Discharge: 2022-12-21 | Disposition: A | Discharge status: Home / Self Care | Payer: Medicare HMO | Attending: Internal Medicine | Admitting: Internal Medicine

## 2022-12-21 ENCOUNTER — Encounter (HOSPITAL_COMMUNITY): Payer: Self-pay | Admitting: Emergency Medicine

## 2022-12-21 DIAGNOSIS — M25572 Pain in left ankle and joints of left foot: Secondary | ICD-10-CM | POA: Diagnosis not present

## 2022-12-21 DIAGNOSIS — M79672 Pain in left foot: Secondary | ICD-10-CM

## 2022-12-21 NOTE — ED Triage Notes (Signed)
Pt c/o left ankle injury after he twisted it 12/10/22 getting off a boat. States it was fine initially but a week later it began to swell and bruise. Denies having pain just stiffness.

## 2022-12-21 NOTE — ED Provider Notes (Signed)
[facility]    CSN: [id number] Arrival date & time: 12/21/22  1350      History   Chief Complaint Chief Complaint  Patient presents with   Ankle Pain    HPI Jeremy [name] is a 72 y.o. male.   Patient presents to clinic complaining of left ankle swelling and stiffness after an injury on 11/9.  He fell getting off of a boat, may have hit his foot on the dock, is unsure.  Initially the injury was fine and had no pain but a week later began to swell and bruise.  He is not having any current pain.  He is on Plavix.  He is ambulatory without pain.  The history is provided by the patient and medical records.  Ankle Pain   Past Medical History:  Diagnosis Date   Colon polyp 07/05/2007   TUBULAR ADENOMA   Coronary artery disease    Cardiac catheterization 8/07: Proximal LAD 50-75%, mid LAD 60%, ostial septal perforator 80%, proximal RCA 50% and then 80-85%, distal RCA 80-90%, EF 55-60%.  PCI was performed at that time colon distal RCA treated with a Taxus drug-eluting stent.  The proximal RCA treated with a Taxus drug eluting stent.  The LAD was studied with IVUS and treated with a Taxus drug eluding stent.     Hyperlipidemia    Ill-defined closed fractures of upper limb    Sinus bradycardia    sees Dr. Graciela Husbands    Syncope     Patient Active Problem List   Diagnosis Date Noted   BPH with urinary obstruction 08/16/2019   Acute left-sided low back pain with left-sided sciatica 02/10/2019   Long term current use of antithrombotics/antiplatelets - clopidogrel - cardiac stents 04/01/2013   Sinus bradycardia 05/25/2010   SYNCOPE 04/27/2009   History of colonic polyps 07/10/2007   Dyslipidemia 04/28/2007   coronary artery disease-tandem stenting DES-RCA; stent to LAD 2007 04/28/2007    Past Surgical History:  Procedure Laterality Date   APPENDECTOMY     COLONOSCOPY  06-20-13   per Dr. Leone Payor, clear, repeat in 10 yrs    CORONARY ANGIOPLASTY         Home  Medications    Prior to Admission medications   Medication Sig Start Date End Date Taking? Authorizing Provider  aspirin 81 MG tablet Take 81 mg by mouth daily.    [provider]  clopidogrel (PLAVIX) 75 MG tablet TAKE 1 TABLET DAILY 12/15/22   Nelwyn Salisbury, MD  COVID-19 mRNA bivalent vaccine, Pfizer, (PFIZER COVID-19 VAC BIVALENT) injection Inject into the muscle. 10/26/20   Judyann Munson, MD  ezetimibe (ZETIA) 10 MG tablet TAKE 1 TABLET DAILY 10/26/22   Nelwyn Salisbury, MD  fluticasone [facility]) 50 MCG/ACT nasal spray Place 2 sprays into the nose as directed. 04/23/12   Norins, Rosalyn Gess, MD  hydrochlorothiazide (MICROZIDE) 12.5 MG capsule Take 1 capsule (12.5 mg total) by mouth daily. 03/14/22   Duke Salvia, MD  rosuvastatin (CRESTOR) 10 MG tablet Take 1 tablet (10 mg total) by mouth daily. 12/19/22   Duke Salvia, MD  sildenafil (VIAGRA) 100 MG tablet Take 1 tablet (100 mg total) by mouth as needed for erectile dysfunction. 01/14/21   Nelwyn Salisbury, MD    Family History Family History  Problem Relation Age of Onset   Prostate cancer Father    Cancer Father        prostate cancer w/ mets   Heart disease Father  CAD   Hyperlipidemia Father    Stroke Mother    Colon cancer Maternal Uncle 60   Heart attack Paternal Grandmother    COPD Neg Hx    Diabetes Neg Hx     Social History Social History   Tobacco Use   Smoking status: Former   Smokeless tobacco: Never  Vaping Use   Vaping status: Never Used  Substance Use Topics   Alcohol use: Yes    Alcohol/week: 0.0 standard drinks of alcohol    Comment: occ   Drug use: No     Allergies   Patient has no known allergies.   Review of Systems Review of Systems  Per HPI   Physical Exam Triage Vital Signs ED Triage Vitals  Encounter Vitals Group     BP 12/21/22 1457 (!) 141/58     Systolic BP Percentile --      Diastolic BP Percentile --      Pulse Rate 12/21/22 1457 61     Resp 12/21/22 1457  18     Temp 12/21/22 1457 98.2 F (36.8 C)     Temp Source 12/21/22 1457 Oral     SpO2 12/21/22 1457 96 %     Weight --      Height --      Head Circumference --      Peak Flow --      Pain Score 12/21/22 1502 0     Pain Loc --      Pain Education --      Exclude from Growth Chart --    No data found.  Updated Vital Signs BP (!) 141/58 (BP Location: Right Arm)   Pulse 61   Temp 98.2 F (36.8 C) (Oral)   Resp 18   SpO2 96%   Visual Acuity Right Eye Distance:   Left Eye Distance:   Bilateral Distance:    Right Eye Near:   Left Eye Near:    Bilateral Near:     Physical Exam Vitals and nursing note reviewed.  Constitutional:      Appearance: Normal appearance.  HENT:     Head: Normocephalic and atraumatic.     Right Ear: External ear normal.     Left Ear: External ear normal.     Nose: Nose normal.     Mouth/Throat:     Mouth: Mucous membranes are moist.  Eyes:     Conjunctiva/sclera: Conjunctivae normal.  Cardiovascular:     Rate and Rhythm: Normal rate.     Pulses: Normal pulses.          Posterior tibial pulses are 2+ on the left side.  Pulmonary:     Effort: Pulmonary effort is normal. No respiratory distress.  Musculoskeletal:        General: Swelling present. No tenderness. Normal range of motion.  Feet:     Comments: Left ankle with nonpitting edema.  Bruising and discoloration in all toes and midfoot area.  Entire foot and ankle is nontender to palpation.  Range of motion intact. Skin:    General: Skin is warm and dry.     Capillary Refill: Capillary refill takes less than 2 seconds.     Findings: Bruising present.  Neurological:     General: No focal deficit present.     Mental Status: He is alert.  Psychiatric:        Mood and Affect: Mood normal.      UC Treatments / Results  Labs (all labs ordered  are listed, but only abnormal results are displayed) Labs Reviewed - No data to display  EKG   Radiology No results  found.  Procedures Procedures (including critical care time)  Medications Ordered in UC Medications - No data to display  Initial Impression / Assessment and Plan / UC Course  I have reviewed the triage vital signs and the nursing notes.  Pertinent labs & imaging results that were available during my care of the patient were reviewed by me and considered in my medical decision making (see chart for details).  Vitals and triage reviewed, patient is hemodynamically stable.  Left ankle and foot is swollen and bruised, range of motion and pulses intact.  No pain.  Did have trauma on 12/10/22. Ambulatory.  Imaging does not show any obvious acute deformities or fractures, awaiting official radiology overread advised elevation and Ace wrap.  Patient verbalized understanding, no questions at this time.     Final Clinical Impressions(s) / UC Diagnoses   Final diagnoses:  Left foot pain     Discharge Instructions      I do not see any obvious deformity or fractures on your imaging.  You likely have swelling and bruising from your anticoagulant.  You can compress the area and elevated taking Tylenol as needed if you develop any pain.  The bruising and swelling should gradually improve.  Return to clinic for any new or urgent symptoms.     ED Prescriptions   None    PDMP not reviewed this encounter.   Gurjot Brisco, [place], [place] 12/21/22 [phone]

## 2022-12-21 NOTE — Discharge Instructions (Addendum)
I do not see any obvious deformity or fractures on your imaging.  You likely have swelling and bruising from your anticoagulant.  You can compress the area and elevated taking Tylenol as needed if you develop any pain.  The bruising and swelling should gradually improve.  Return to clinic for any new or urgent symptoms.

## 2022-12-22 ENCOUNTER — Other Ambulatory Visit (HOSPITAL_BASED_OUTPATIENT_CLINIC_OR_DEPARTMENT_OTHER): Payer: Self-pay

## 2022-12-22 ENCOUNTER — Ambulatory Visit (HOSPITAL_COMMUNITY): Payer: Medicare HMO

## 2022-12-22 MED ORDER — INFLUENZA VAC A&B SURF ANT ADJ 0.5 ML IM SUSY
0.5000 mL | PREFILLED_SYRINGE | Freq: Once | INTRAMUSCULAR | 0 refills | Status: AC
  Filled 2022-12-22: qty 0.5, 1d supply, fill #0

## 2022-12-30 ENCOUNTER — Other Ambulatory Visit (HOSPITAL_BASED_OUTPATIENT_CLINIC_OR_DEPARTMENT_OTHER): Payer: Self-pay

## 2022-12-30 MED ORDER — COVID-19 MRNA VAC-TRIS(PFIZER) 30 MCG/0.3ML IM SUSY
0.3000 mL | PREFILLED_SYRINGE | Freq: Once | INTRAMUSCULAR | 0 refills | Status: AC
  Filled 2022-12-30: qty 0.3, 1d supply, fill #0

## 2023-02-24 ENCOUNTER — Other Ambulatory Visit: Payer: Self-pay | Admitting: Internal Medicine

## 2023-03-01 ENCOUNTER — Encounter: Payer: Self-pay | Admitting: Internal Medicine

## 2023-03-01 ENCOUNTER — Other Ambulatory Visit: Payer: Self-pay | Admitting: Internal Medicine

## 2023-03-06 ENCOUNTER — Other Ambulatory Visit: Payer: Self-pay

## 2023-03-09 ENCOUNTER — Telehealth: Payer: Medicare HMO | Admitting: Family Medicine

## 2023-03-09 ENCOUNTER — Telehealth: Payer: Self-pay | Admitting: *Deleted

## 2023-03-09 ENCOUNTER — Encounter: Payer: Self-pay | Admitting: Family Medicine

## 2023-03-09 DIAGNOSIS — R051 Acute cough: Secondary | ICD-10-CM

## 2023-03-09 DIAGNOSIS — R0981 Nasal congestion: Secondary | ICD-10-CM | POA: Diagnosis not present

## 2023-03-09 MED ORDER — BENZONATATE 100 MG PO CAPS: ORAL_CAPSULE | ORAL | 0 refills | Status: DC

## 2023-03-09 NOTE — Patient Instructions (Signed)
 INSTRUCTIONS FOR UPPER RESPIRATORY INFECTION:  -plenty of rest and fluids  -nasal saline wash 2-3 times daily (use prepackaged nasal saline or bottled/distilled water if making your own)   -in the winter time, using a humidifier at night is helpful (please follow cleaning instructions)  -if you are taking a cough medication - use only as directed, may also try a teaspoon of honey to coat the throat and throat lozenges. If given a cough medication with codeine or hydrocodone or other narcotic please be advised that this contains a strong and  potentially addicting medication. Please follow instructions carefully, take as little as possible and only use AS NEEDED for severe cough. Discuss potential side effects with your pharmacy. Please do not drive or operate machinery while taking these types of medications. Please do not take other sedating medications, drugs or alcohol while taking this medication without discussing with your doctor.  -for sore throat, salt water gargles can help  -follow up if you have fevers, facial pain, tooth pain, difficulty breathing or are worsening or symptoms persist longer then expected  Upper Respiratory Infection, Adult An upper respiratory infection (URI) is also known as the common cold. It is often caused by a type of germ (virus). Colds are easily spread (contagious). You can pass it to others by kissing, coughing, sneezing, or drinking out of the same glass. Usually, you get better in 1 to 3  weeks.  However, the cough can last for even longer. HOME CARE  Only take medicine as told by your doctor. Follow instructions provided above. Drink enough water and fluids to keep your pee (urine) clear or pale yellow. Get plenty of rest. Return to work when your temperature is < 100 for 24 hours or as told by your doctor. You may use a face mask and wash your hands to stop your cold from spreading. GET HELP RIGHT AWAY IF:  After the first few days, you feel you are  getting worse. You have questions about your medicine. You have chills, shortness of breath, or red spit (mucus). You have pain in the face for more then 1-2 days, especially when you bend forward. You have a fever, puffy (swollen) neck, pain when you swallow, or white spots in the back of your throat. You have a bad headache, ear pain, sinus pain, or chest pain. You have a high-pitched whistling sound when you breathe in and out (wheezing). You cough up blood. You have sore muscles or a stiff neck. MAKE SURE YOU:  Understand these instructions. Will watch your condition. Will get help right away if you are not doing well or get worse. Document Released: 07/06/2007 Document Revised: 04/11/2011 Document Reviewed: 04/24/2013 [facility] Patient Information 2015 [place], [place]. This information is not intended to replace advice given to you by your health care provider. Make sure you discuss any questions you have with your health care provider.

## 2023-03-09 NOTE — Telephone Encounter (Signed)
 Copied from CRM [id number]. Topic: Clinical - Medical Advice >> Mar 09, 2023  4:06 PM Thersia BROCKS wrote: Reason for CRM: Patient called in stated her would like to get that cough medicine prescribed after all , he would like for [facility] to be [facility] [place]

## 2023-03-09 NOTE — Progress Notes (Signed)
 Virtual Visit via Video Note  I connected with J.C.  on 03/09/23 at  3:40 PM EST by a video enabled telemedicine application and verified that I am speaking with the correct person using two identifiers.  Location patient: [place] Location provider:work or home office Persons participating in the virtual visit: patient, provider  I discussed the limitations and requested verbal permission for telemedicine visit. The patient expressed understanding and agreed to proceed.   HPI:  Acute telemedicine visit for : -Onset: 4-5 days ago -did test negative for covid on home test -Symptoms include: nasal congestion, cough, lower than usual energy, had low grade temp around 22 -wife now with the same symptoms -no known covid or flu exposure -Denies: NVD, CP, SOB, sig body aches -Has tried: tylenol -Pertinent past medical history: see below -Pertinent medication allergies:No Known Allergies -COVID-19 vaccine status:  Immunization History  Administered Date(s) Administered   Fluad  Quad(high Dose 65+) 11/11/2020, 10/08/2021   Fluad  Trivalent(High Dose 65+) 12/22/2022   Influenza Whole 03/03/2009   Influenza, High Dose Seasonal PF 10/08/2014, 10/29/2015, 11/14/2016   Influenza, Seasonal, Injecte, Preservative Fre 02/20/2012   Influenza,inj,Quad PF,6+ Mos 02/18/2014, 11/23/2017, 11/27/2018   PFIZER(Purple Top)SARS-COV-2 Vaccination 02/22/2019, 03/15/2019, 11/05/2019   Pfizer Covid-19 Vaccine Bivalent Booster 8yrs & up 10/26/2020   Pfizer(Comirnaty )Fall Seasonal Vaccine 12 years and older 10/22/2021, 12/30/2022   Pneumococcal Conjugate-13 04/18/2013   Pneumococcal Polysaccharide-23 04/20/2011   Td 03/03/2009   Zoster, Live 04/20/2011     ROS: See pertinent positives and negatives per HPI.  Past Medical History:  Diagnosis Date   Colon polyp 07/05/2007   TUBULAR ADENOMA   Coronary artery disease    Cardiac catheterization 8/07: Proximal LAD 50-75%, mid LAD 60%, ostial septal perforator 80%,  proximal RCA 50% and then 80-85%, distal RCA 80-90%, EF 55-60%.  PCI was performed at that time colon distal RCA treated with a Taxus drug-eluting stent.  The proximal RCA treated with a Taxus drug eluting stent.  The LAD was studied with IVUS and treated with a Taxus drug eluding stent.     Hyperlipidemia    Ill-defined closed fractures of upper limb    Sinus bradycardia    sees Dr. Fernande    Syncope     Past Surgical History:  Procedure Laterality Date   APPENDECTOMY     COLONOSCOPY  06-20-13   per Dr. Avram, clear, repeat in 10 yrs    CORONARY ANGIOPLASTY       Current Outpatient Medications:    aspirin 81 MG tablet, Take 81 mg by mouth daily., Disp: , Rfl:    clopidogrel  (PLAVIX ) 75 MG tablet, TAKE 1 TABLET DAILY, Disp: 90 tablet, Rfl: 3   COVID-19 mRNA bivalent vaccine, Pfizer, (PFIZER COVID-19 VAC BIVALENT) injection, Inject into the muscle., Disp: 0.3 mL, Rfl: 0   ezetimibe  (ZETIA ) 10 MG tablet, TAKE 1 TABLET DAILY, Disp: 90 tablet, Rfl: 3   fluticasone  (FLONASE ) 50 MCG/ACT nasal spray, Place 2 sprays into the nose as directed., Disp: 48 g, Rfl: 3   hydrochlorothiazide  (MICROZIDE ) 12.5 MG capsule, Take 1 capsule (12.5 mg total) by mouth daily. Please keep scheduled appointment for future refills. Thank you., Disp: 30 capsule, Rfl: 0   rosuvastatin  (CRESTOR ) 10 MG tablet, Take 1 tablet (10 mg total) by mouth daily., Disp: 90 tablet, Rfl: 0   sildenafil  (VIAGRA ) 100 MG tablet, Take 1 tablet (100 mg total) by mouth as needed for erectile dysfunction., Disp: 10 tablet, Rfl: 5  EXAM:  VITALS per patient if applicable:  GENERAL: alert, oriented, appears well and in no acute distress  HEENT: atraumatic, conjunttiva clear, no obvious abnormalities on inspection of external nose and ears  NECK: normal movements of the head and neck  LUNGS: on inspection no signs of respiratory distress, breathing rate appears normal, no obvious gross SOB, gasping or wheezing  CV: no obvious  cyanosis  MS: moves all visible extremities without noticeable abnormality  PSYCH/NEURO: pleasant and cooperative, no obvious depression or anxiety, speech and thought processing grossly intact  ASSESSMENT AND PLAN:  Discussed the following assessment and plan:  Nasal congestion  Acute cough  -we discussed possible serious and likely etiologies, options for evaluation and workup, limitations of telemedicine visit vs in person visit, treatment, treatment risks and precautions. Pt is agreeable to treatment via telemedicine at this moment. Suspect VURI, possible mild flu or covid vs other. He has opted to continue with symptomatic care, nasal saline, salt water gargles, staying hydrated and monitor. Declined rx for cough as feels is doing ok. Let him know about the otc covid/flu tests and tx window for each. Discussed signs/symptoms of potential complications.  Work/School slipped offered: declined. Advised to seek prompt virtual visit or in person care if worsening, new symptoms arise, or if is not improving with treatment as expected per our conversation of expected course. Discussed options for follow up care. Did let this patient know that I do telemedicine on Tuesdays and Thursdays for [place] and those are the days I am logged into the system. Advised to schedule follow up visit with PCP, [place] virtual visits or [facility] if any further questions or concerns to avoid delays in care.   I discussed the assessment and treatment plan with the patient. The patient was provided an opportunity to ask questions and all were answered. The patient agreed with the plan and demonstrated an understanding of the instructions.     Chiquita JONELLE Cramp, DO

## 2023-03-14 NOTE — Telephone Encounter (Signed)
Follow up with pt. Pt reports he has already pick it up the Rx. Needs no further action.

## 2023-03-17 ENCOUNTER — Other Ambulatory Visit: Payer: Self-pay | Admitting: Family Medicine

## 2023-03-17 ENCOUNTER — Telehealth: Payer: Self-pay | Admitting: Family Medicine

## 2023-03-17 ENCOUNTER — Other Ambulatory Visit: Payer: Self-pay | Admitting: Internal Medicine

## 2023-03-17 NOTE — Telephone Encounter (Signed)
Copied from CRM [id number]. Topic: Clinical - Medication Refill >> Mar 17, 2023  9:10 AM Marica Otter wrote: Most Recent Primary Care Visit:  Provider: Terressa Koyanagi  Department: [facility]  Visit Type: MYCHART VIDEO VISIT  Date: 03/09/2023  Medication: benzonatate (TESSALON PERLES) 100 MG capsule  Has the patient contacted their pharmacy? Yes, No refills patient still has slight cough (Agent: If no, request that the patient contact the pharmacy for the refill. If patient does not wish to contact the pharmacy document the reason why and proceed with request.) (Agent: If yes, when and what did the pharmacy advise?)  Is this the correct pharmacy for this prescription? Yes If no, delete pharmacy and type the correct one.  This is the patient's preferred pharmacy:    [id number] [place], [place] - [address] [place] [address] Phone: [phone] Fax: [phone]   Has the prescription been filled recently? Yes  Is the patient out of the medication? Yes  Has the patient been seen for an appointment in the last year OR does the patient have an upcoming appointment? Yes  Can we respond through MyChart? Yes  Agent: Please be advised that Rx refills may take up to 3 business days. We ask that you follow-up with your pharmacy.

## 2023-03-17 NOTE — Telephone Encounter (Signed)
Copied from CRM [id number]. Topic: Clinical - Medication Refill >> Mar 17, 2023  9:08 AM Marica Otter wrote: Most Recent Primary Care Visit:  Provider: Terressa Koyanagi  Department: [facility]  Visit Type: MYCHART VIDEO VISIT  Date: 03/09/2023  Medication: benzonatate (TESSALON PERLES) 100 MG capsule  Has the patient contacted their pharmacy? Yes, no refills but still has slight cough (Agent: If no, request that the patient contact the pharmacy for the refill. If patient does not wish to contact the pharmacy document the reason why and proceed with request.) (Agent: If yes, when and what did the pharmacy advise?)  Is this the correct pharmacy for this prescription? Yes If no, delete pharmacy and type the correct one.  This is the patient's preferred pharmacy:    [id number] [place], [place] - [address] [place] [address] Phone: [phone] Fax: [phone]   Has the prescription been filled recently? Yes  Is the patient out of the medication? Yes  Has the patient been seen for an appointment in the last year OR does the patient have an upcoming appointment? Yes  Can we respond through MyChart? Yes  Agent: Please be advised that Rx refills may take up to 3 business days. We ask that you follow-up with your pharmacy.

## 2023-03-22 ENCOUNTER — Encounter: Payer: Self-pay | Admitting: Internal Medicine

## 2023-03-22 ENCOUNTER — Ambulatory Visit: Payer: Medicare HMO | Attending: Internal Medicine | Admitting: Internal Medicine

## 2023-03-22 VITALS — BP 116/70 | HR 55 | Ht 70.0 in | Wt 178.4 lb

## 2023-03-22 DIAGNOSIS — R55 Syncope and collapse: Secondary | ICD-10-CM | POA: Diagnosis not present

## 2023-03-22 DIAGNOSIS — R001 Bradycardia, unspecified: Secondary | ICD-10-CM

## 2023-03-22 NOTE — Progress Notes (Signed)
Patient Care Team: Nelwyn Salisbury, MD as PCP - General (Family Medicine) Duke Salvia, MD as Consulting Physician (Cardiology)   HPI  Jeremy [name] is a 73 y.o. male Seen in followup for syncope.   History of coronary artery disease -- distal RCA   treated with an Taxus drug-eluting stent after his first syncopal episode.. Alos proximal RCA and of his LAD.  2007  Repeat LHC 2012 with mild ISR; EF 55% age at that time was about 70  I have reviewed his catheterization with Dr. Erlinda Hong. The plan will be long term DAPT    The patient denies chest pain, shortness of breath, nocturnal dyspnea, orthopnea or peripheral edema.  There have been no palpitations, lightheadedness or syncope.    Reitired 1/25   Date Cr K LDL  11/18 0.91 4.9   3/20  0.92 4.4 74  7/21 0.87 4.6 ?,< 60   7/22   92  9/23 0.94 4.3 46          Past Medical History:  Diagnosis Date   Colon polyp 07/05/2007   TUBULAR ADENOMA   Coronary artery disease    Cardiac catheterization 8/07: Proximal LAD 50-75%, mid LAD 60%, ostial septal perforator 80%, proximal RCA 50% and then 80-85%, distal RCA 80-90%, EF 55-60%.  PCI was performed at that time colon distal RCA treated with a Taxus drug-eluting stent.  The proximal RCA treated with a Taxus drug eluting stent.  The LAD was studied with IVUS and treated with a Taxus drug eluding stent.     Hyperlipidemia    Ill-defined closed fractures of upper limb    Sinus bradycardia    sees Dr. Graciela Husbands    Syncope     Past Surgical History:  Procedure Laterality Date   APPENDECTOMY     COLONOSCOPY  06-20-13   per Dr. Leone Payor, clear, repeat in 10 yrs    CORONARY ANGIOPLASTY      Current Outpatient Medications  Medication Sig Dispense Refill   aspirin 81 MG tablet Take 81 mg by mouth daily.     benzonatate (TESSALON PERLES) 100 MG capsule 1-2 capsules up to twice daily as needed for cough. 30 capsule 0   clopidogrel (PLAVIX) 75 MG tablet TAKE 1 TABLET DAILY 90 tablet 3    ezetimibe (ZETIA) 10 MG tablet TAKE 1 TABLET DAILY 90 tablet 3   fluticasone (FLONASE) 50 MCG/ACT nasal spray Place 2 sprays into the nose as directed. 48 g 3   hydrochlorothiazide (MICROZIDE) 12.5 MG capsule Take 1 capsule (12.5 mg total) by mouth daily. Please keep scheduled appointment for future refills. Thank you. 30 capsule 0   rosuvastatin (CRESTOR) 10 MG tablet TAKE 1 TABLET BY MOUTH EVERY DAY 90 tablet 0   sildenafil (VIAGRA) 100 MG tablet Take 1 tablet (100 mg total) by mouth as needed for erectile dysfunction. 10 tablet 5   No current facility-administered medications for this visit.    No Known Allergies  Review of Systems negative except from HPI and PMH  Physical Exam BP 116/70 Comment: standing @ 3 mins  Pulse (!) 55   Ht 5\' 10"  (1.778 m)   Wt 178 lb 6.4 oz (80.9 kg)   SpO2 96%   BMI 25.60 kg/m  Well developed and nourished in no acute distress HENT normal Neck supple with JVP-  flat  Clear Regular rate and rhythm, no murmurs or gallops Abd-soft with active BS No Clubbing cyanosis edema Skin-warm and dry  A & Oriented  Grossly normal sensory and motor function  ECG sinus at 50 Interval 17/09/43  Assessment and  Plan  Syncope  Coronary artery disease status post Taxus stenting RCA/LAD 2007--discussion with [facility] long term DAPT  Hyperlipidemia  Sinus bradycardia No interval syncope  No chest pain , LDL to target will continue the rosuvastatin.  Long-term DAPT per discussions with Dr. [facility]

## 2023-03-22 NOTE — Patient Instructions (Addendum)
Medication Instructions:  Your physician recommends that you continue on your current medications as directed. Please refer to the Current Medication list given to you today.  *If you need a refill on your cardiac medications before your next appointment, please call your pharmacy*   Lab Work: None ordered.  If you have labs (blood work) drawn today and your tests are completely normal, you will receive your results only by: MyChart Message (if you have MyChart) OR A paper copy in the mail If you have any lab test that is abnormal or we need to change your treatment, we will call you to review the results.   Testing/Procedures: None ordered.    Follow-Up: At [facility], you and your health needs are our priority.  As part of our continuing mission to provide you with exceptional heart care, we have created designated Provider Care Teams.  These Care Teams include your primary Cardiologist (physician) and Advanced Practice Providers (APPs -  Physician Assistants and Nurse Practitioners) who all work together to provide you with the care you need, when you need it.  We recommend signing up for the patient portal called "MyChart".  Sign up information is provided on this After Visit Summary.  MyChart is used to connect with patients for Virtual Visits (Telemedicine).  Patients are able to view lab/test results, encounter notes, upcoming appointments, etc.  Non-urgent messages can be sent to your provider as well.   To learn more about what you can do with MyChart, go to [email or web address].    Your next appointment:   12 months  with general cardiology  Establish with general cardiology

## 2023-03-23 ENCOUNTER — Other Ambulatory Visit: Payer: Self-pay

## 2023-03-23 MED ORDER — BENZONATATE 100 MG PO CAPS: ORAL_CAPSULE | ORAL | 0 refills | Status: DC

## 2023-03-23 NOTE — Telephone Encounter (Signed)
 Rx sent to pt pharmacy

## 2023-03-26 ENCOUNTER — Other Ambulatory Visit: Payer: Self-pay | Admitting: Internal Medicine

## 2023-04-06 ENCOUNTER — Encounter: Payer: Self-pay | Admitting: Family Medicine

## 2023-04-06 ENCOUNTER — Ambulatory Visit (INDEPENDENT_AMBULATORY_CARE_PROVIDER_SITE_OTHER): Payer: Medicare HMO | Admitting: Family Medicine

## 2023-04-06 VITALS — BP 110/60 | HR 50 | Temp 98.8°F | Ht 70.0 in | Wt 180.0 lb

## 2023-04-06 DIAGNOSIS — R739 Hyperglycemia, unspecified: Secondary | ICD-10-CM

## 2023-04-06 DIAGNOSIS — N138 Other obstructive and reflux uropathy: Secondary | ICD-10-CM | POA: Diagnosis not present

## 2023-04-06 DIAGNOSIS — E785 Hyperlipidemia, unspecified: Secondary | ICD-10-CM | POA: Diagnosis not present

## 2023-04-06 DIAGNOSIS — N401 Enlarged prostate with lower urinary tract symptoms: Secondary | ICD-10-CM

## 2023-04-06 DIAGNOSIS — R55 Syncope and collapse: Secondary | ICD-10-CM

## 2023-04-06 DIAGNOSIS — R001 Bradycardia, unspecified: Secondary | ICD-10-CM | POA: Diagnosis not present

## 2023-04-06 DIAGNOSIS — I2510 Atherosclerotic heart disease of native coronary artery without angina pectoris: Secondary | ICD-10-CM

## 2023-04-06 LAB — CBC WITH DIFFERENTIAL/PLATELET
Basophils Absolute: 0 10*3/uL (ref 0.0–0.1)
Basophils Relative: 0.8 % (ref 0.0–3.0)
Eosinophils Absolute: 0.5 10*3/uL (ref 0.0–0.7)
Eosinophils Relative: 9.2 % — ABNORMAL HIGH (ref 0.0–5.0)
HCT: 41.9 % (ref 39.0–52.0)
Hemoglobin: 13.9 g/dL (ref 13.0–17.0)
Lymphocytes Relative: 22.5 % (ref 12.0–46.0)
Lymphs Abs: 1.2 10*3/uL (ref 0.7–4.0)
MCHC: 33.2 g/dL (ref 30.0–36.0)
MCV: 90.9 fl (ref 78.0–100.0)
Monocytes Absolute: 0.7 10*3/uL (ref 0.1–1.0)
Monocytes Relative: 12.4 % — ABNORMAL HIGH (ref 3.0–12.0)
Neutro Abs: 3 10*3/uL (ref 1.4–7.7)
Neutrophils Relative %: 55.1 % (ref 43.0–77.0)
Platelets: 215 10*3/uL (ref 150.0–400.0)
RBC: 4.61 Mil/uL (ref 4.22–5.81)
RDW: 14.8 % (ref 11.5–15.5)
WBC: 5.5 10*3/uL (ref 4.0–10.5)

## 2023-04-06 LAB — BASIC METABOLIC PANEL
BUN: 17 mg/dL (ref 6–23)
CO2: 30 meq/L (ref 19–32)
Calcium: 9.2 mg/dL (ref 8.4–10.5)
Chloride: 99 meq/L (ref 96–112)
Creatinine, Ser: 0.78 mg/dL (ref 0.40–1.50)
GFR: 88.23 mL/min (ref >60.00)
Glucose, Bld: 87 mg/dL (ref 70–99)
Potassium: 4.1 meq/L (ref 3.5–5.1)
Sodium: 136 meq/L (ref 135–145)

## 2023-04-06 LAB — HEPATIC FUNCTION PANEL
ALT: 24 U/L (ref 0–53)
AST: 23 U/L (ref 0–37)
Albumin: 4.2 g/dL (ref 3.5–5.2)
Alkaline Phosphatase: 71 U/L (ref 39–117)
Bilirubin, Direct: 0.2 mg/dL (ref 0.0–0.3)
Total Bilirubin: 0.8 mg/dL (ref 0.2–1.2)
Total Protein: 6.8 g/dL (ref 6.0–8.3)

## 2023-04-06 LAB — HEMOGLOBIN A1C: Hgb A1c MFr Bld: 6.2 % (ref 4.6–6.5)

## 2023-04-06 LAB — LIPID PANEL
Cholesterol: 110 mg/dL (ref 0–200)
HDL: 43.1 mg/dL (ref >39.00)
LDL Cholesterol: 53 mg/dL (ref 0–99)
NonHDL: 67.3
Total CHOL/HDL Ratio: 3
Triglycerides: 70 mg/dL (ref 0.0–149.0)
VLDL: 14 mg/dL (ref 0.0–40.0)

## 2023-04-06 LAB — PSA: PSA: 0.46 ng/mL (ref 0.10–4.00)

## 2023-04-06 LAB — TSH: TSH: 1.73 u[IU]/mL (ref 0.35–5.50)

## 2023-04-06 NOTE — Progress Notes (Signed)
 Subjective:    Patient ID: Jeremy [name], male    DOB: Dec 24, 1950, 73 y.o.   MRN: [id number]  HPI Here to follow up on issues. He feels well in general. He saw Dr. Graciela Husbands on 03-22-23, and he seemed to be doing well from a cardiologic perspective. He remains active. He has been urinating freely and only gets up once or twice a night to urinate. He has not had a syncopal spell in quite a while.    Review of Systems  Constitutional: Negative.   HENT: Negative.    Eyes: Negative.   Respiratory: Negative.    Cardiovascular: Negative.   Gastrointestinal: Negative.   Genitourinary: Negative.   Musculoskeletal: Negative.   Skin: Negative.   Neurological: Negative.   Psychiatric/Behavioral: Negative.         Objective:   Physical Exam Constitutional:      General: He is not in acute distress.    Appearance: Normal appearance. He is well-developed. He is not diaphoretic.  HENT:     Head: Normocephalic and atraumatic.     Right Ear: External ear normal.     Left Ear: External ear normal.     Nose: Nose normal.     Mouth/Throat:     Pharynx: No oropharyngeal exudate.  Eyes:     General: No scleral icterus.       Right eye: No discharge.        Left eye: No discharge.     Conjunctiva/sclera: Conjunctivae normal.     Pupils: Pupils are equal, round, and reactive to light.  Neck:     Thyroid: No thyromegaly.     Vascular: No JVD.     Trachea: No tracheal deviation.  Cardiovascular:     Rate and Rhythm: Normal rate and regular rhythm.     Pulses: Normal pulses.     Heart sounds: Normal heart sounds. No murmur heard.    No friction rub. No gallop.  Pulmonary:     Effort: Pulmonary effort is normal. No respiratory distress.     Breath sounds: Normal breath sounds. No wheezing or rales.  Chest:     Chest wall: No tenderness.  Abdominal:     General: Bowel sounds are normal. There is no distension.     Palpations: Abdomen is soft. There is no mass.     Tenderness: There is no  abdominal tenderness. There is no guarding or rebound.  Genitourinary:    Penis: Normal. No tenderness.      Testes: Normal.     Prostate: Normal.     Rectum: Normal. Guaiac result negative.  Musculoskeletal:        General: No tenderness. Normal range of motion.     Cervical back: Neck supple.  Lymphadenopathy:     Cervical: No cervical adenopathy.  Skin:    General: Skin is warm and dry.     Coloration: Skin is not pale.     Findings: No erythema or rash.  Neurological:     General: No focal deficit present.     Mental Status: He is alert and oriented to person, place, and time.     Cranial Nerves: No cranial nerve deficit.     Motor: No abnormal muscle tone.     Coordination: Coordination normal.     Deep Tendon Reflexes: Reflexes are normal and symmetric. Reflexes normal.  Psychiatric:        Mood and Affect: Mood normal.        Behavior:  Behavior normal.        Thought Content: Thought content normal.        Judgment: Judgment normal.           Assessment & Plan:  He is doing well in general. His CAD is stable. No recent syncopal episodes. His BPH is stable. Get fasting labs to check lipids, etc. We spent a total of ( 33  ) minutes reviewing records and discussing these issues.  Gershon Crane, MD

## 2023-04-10 ENCOUNTER — Encounter: Payer: Self-pay | Admitting: *Deleted

## 2023-04-18 ENCOUNTER — Ambulatory Visit: Payer: Medicare HMO | Admitting: Family Medicine

## 2023-05-18 ENCOUNTER — Encounter: Payer: Self-pay | Admitting: Gastroenterology

## 2023-05-22 DIAGNOSIS — H5212 Myopia, left eye: Secondary | ICD-10-CM | POA: Diagnosis not present

## 2023-05-22 DIAGNOSIS — Z0100 Encounter for examination of eyes and vision without abnormal findings: Secondary | ICD-10-CM | POA: Diagnosis not present

## 2023-06-12 ENCOUNTER — Telehealth: Payer: Self-pay | Admitting: Internal Medicine

## 2023-06-12 MED ORDER — ROSUVASTATIN CALCIUM 10 MG PO TABS: 10.0000 mg | ORAL_TABLET | Freq: Every day | ORAL | 3 refills | Status: DC

## 2023-06-12 NOTE — Telephone Encounter (Signed)
 *  STAT* If patient is at the pharmacy, call can be transferred to refill team.   1. Which medications need to be refilled? (please list name of each medication and dose if known)   rosuvastatin  (CRESTOR ) 10 MG tablet     2. Would you like to learn more about the convenience, safety, & potential cost savings by using the [facility]? No      3. Are you open to using the [facility] (Type [facility]. ). No    4. Which pharmacy/location (including street and city if local pharmacy) is medication to be sent to?[id number] - [place], [place] - [address]    5. Do they need a 30 day or 90 day supply? 90 days

## 2023-06-12 NOTE — Telephone Encounter (Signed)
 Pt's medication was sent to pt's pharmacy as requested. Confirmation received.

## 2023-07-13 ENCOUNTER — Encounter: Payer: Self-pay | Admitting: Gastroenterology

## 2023-07-13 ENCOUNTER — Ambulatory Visit: Admitting: Gastroenterology

## 2023-07-13 VITALS — BP 120/62 | HR 52 | Ht 70.0 in | Wt 177.0 lb

## 2023-07-13 DIAGNOSIS — Z01818 Encounter for other preprocedural examination: Secondary | ICD-10-CM

## 2023-07-13 DIAGNOSIS — Z7902 Long term (current) use of antithrombotics/antiplatelets: Secondary | ICD-10-CM

## 2023-07-13 DIAGNOSIS — Z1211 Encounter for screening for malignant neoplasm of colon: Secondary | ICD-10-CM

## 2023-07-13 MED ORDER — NA SULFATE-K SULFATE-MG SULF 17.5-3.13-1.6 GM/177ML PO SOLN: 1.0000 | Freq: Once | ORAL | 0 refills | Status: AC

## 2023-07-13 NOTE — Patient Instructions (Signed)
 You will be contacted by our office prior to your procedure for directions on holding your Plavix .  If you do not hear from our office 1 week prior to your scheduled procedure, please call [phone] to discuss.   You have been scheduled for a colonoscopy. Please follow written instructions given to you at your visit today.   If you use inhalers (even only as needed), please bring them with you on the day of your procedure.  DO NOT TAKE 7 DAYS PRIOR TO TEST- Trulicity (dulaglutide) Ozempic, Wegovy (semaglutide) Mounjaro (tirzepatide) Bydureon Bcise (exanatide extended release)  DO NOT TAKE 1 DAY PRIOR TO YOUR TEST Rybelsus (semaglutide) Adlyxin (lixisenatide) Victoza (liraglutide) Byetta (exanatide) _____________________________________________________________________  _______________________________________________________  If your blood pressure at your visit was 140/90 or greater, please contact your primary care physician to follow up on this.  _______________________________________________________  If you are age 67 or older, your body mass index should be between 23-30. Your Body mass index is 25.4 kg/m. If this is out of the aforementioned range listed, please consider follow up with your Primary Care Provider.  If you are age 71 or younger, your body mass index should be between 19-25. Your Body mass index is 25.4 kg/m. If this is out of the aformentioned range listed, please consider follow up with your Primary Care Provider.   ________________________________________________________  The [facility] providers would like to encourage you to use MYCHART to communicate with providers for non-urgent requests or questions.  Due to long hold times on the telephone, sending your provider a message by [facility] may be a faster and more efficient way to get a response.  Please allow 48 business hours for a response.  Please remember that this is for non-urgent requests.   _______________________________________________________,

## 2023-07-13 NOTE — Progress Notes (Signed)
 07/13/2023 Jeremy [name] [id number] 03/23/1950   HISTORY OF PRESENT ILLNESS: This is a 73 year old male who is a patient of Dr. Jadene Maxwell, last seen in 2015 when he had his last colonoscopy.  He is due for repeat.  He has no GI complaints.  He is on Plavix , which he has been on for several years.  Just saw cardiology in February with no new issues.  His Plavix  is actually is prescribed by his PCP, Dr. Alyne Babinski.  Past Medical History:  Diagnosis Date   Colon polyp 07/05/2007   TUBULAR ADENOMA   Coronary artery disease    Cardiac catheterization 8/07: Proximal LAD 50-75%, mid LAD 60%, ostial septal perforator 80%, proximal RCA 50% and then 80-85%, distal RCA 80-90%, EF 55-60%.  PCI was performed at that time colon distal RCA treated with a Taxus drug-eluting stent.  The proximal RCA treated with a Taxus drug eluting stent.  The LAD was studied with IVUS and treated with a Taxus drug eluding stent.     Hyperlipidemia    Ill-defined closed fractures of upper limb    Sinus bradycardia    sees Dr. Rodolfo Clan    Syncope    Past Surgical History:  Procedure Laterality Date   APPENDECTOMY     COLONOSCOPY  06-20-13   per Dr. Willy Harvest, clear, repeat in 10 yrs    CORONARY ANGIOPLASTY      reports that he has quit smoking. He has never used smokeless tobacco. He reports current alcohol use. He reports that he does not use drugs. family history includes Cancer in his father; Colon cancer (age of onset: 29) in his maternal uncle; Heart attack in his paternal grandmother; Heart disease in his father; Hyperlipidemia in his father; Prostate cancer in his father; Stroke in his mother. No Known Allergies    Outpatient Encounter Medications as of 07/13/2023  Medication Sig   aspirin 81 MG tablet Take 81 mg by mouth daily.   clopidogrel  (PLAVIX ) 75 MG tablet TAKE 1 TABLET DAILY   ezetimibe  (ZETIA ) 10 MG tablet TAKE 1 TABLET DAILY   fluticasone  (FLONASE ) 50 MCG/ACT nasal spray Place 2 sprays into the nose as  directed.   hydrochlorothiazide  (MICROZIDE ) 12.5 MG capsule TAKE 1 CAPSULE DAILY   rosuvastatin  (CRESTOR ) 10 MG tablet Take 1 tablet (10 mg total) by mouth daily.   sildenafil  (VIAGRA ) 100 MG tablet Take 1 tablet (100 mg total) by mouth as needed for erectile dysfunction.   No facility-administered encounter medications on file as of 07/13/2023.    REVIEW OF SYSTEMS  : All other systems reviewed and negative except where noted in the History of Present Illness.   PHYSICAL EXAM: BP 120/62   Pulse (!) 52   Ht 5' 10 (1.778 m)   Wt 177 lb (80.3 kg)   BMI 25.40 kg/m  General: Well developed male in no acute distress Head: Normocephalic and atraumatic Eyes:  Sclerae anicteric, conjunctiva pink. Ears: Normal auditory acuity Lungs: Clear throughout to auscultation; no W/R/R. Heart: Regular rate and rhythm; no M/R/G. Musculoskeletal: Symmetrical with no gross deformities  Skin: No lesions on visible extremities Extremities: No edema  Neurological: Alert oriented x 4, grossly non-focal Psychological:  Alert and cooperative. Normal mood and affect  ASSESSMENT AND PLAN: *CRC screening:  Last colonoscopy 2015.  No complaints.  Will schedule with Dr. Willy Harvest. *Chronic antiplatelet use with Plavix :  Hold Plavix  for 5 days before procedure - will instruct when and how to resume after procedure. Risks and  benefits of procedure including bleeding, perforation, infection, missed lesions, medication reactions and possible hospitalization or surgery if complications occur explained. Additional rare but real risk of cardiovascular event such as heart attack or ischemia/infarct of other organs off of Plavix  explained and need to seek urgent help if this occurs. Will communicate by phone or EMR with patient's prescribing provider to confirm that holding Plavix  is reasonable in this case.     CC:  Donley Furth, MD

## 2023-07-25 ENCOUNTER — Encounter: Payer: Self-pay | Admitting: Family Medicine

## 2023-07-25 NOTE — Telephone Encounter (Signed)
 I think the ZZQuil is perfectly fine for him to take, but I suggest he try it before he leaves for the trip

## 2023-08-22 ENCOUNTER — Telehealth: Payer: Self-pay | Admitting: *Deleted

## 2023-08-22 NOTE — Telephone Encounter (Signed)
Sent letter via Epic  

## 2023-08-23 NOTE — Telephone Encounter (Signed)
 Jeremy [name] April 06, 1950 [id number]  08/22/23    Dear Dr. Johnny:  We have scheduled the above named patient for a(n) colonoscopy procedure. Our records show that (s)he is on anticoagulation therapy.  Please advise as to whether the patient may come off their therapy of Plavix  5 days prior to their procedure which is scheduled for 09/05/23.  Please route your response to Powell Misty, CMA or fax response to [phone].  Sincerely,   Powell Misty, [facility]

## 2023-08-24 NOTE — Telephone Encounter (Signed)
 Left message for patient to call office.

## 2023-08-25 ENCOUNTER — Encounter: Payer: Self-pay | Admitting: Internal Medicine

## 2023-08-25 NOTE — Telephone Encounter (Signed)
 Patient informed he may hold Plavix. Patient voiced understanding.

## 2023-09-04 NOTE — Progress Notes (Unsigned)
 [facility] History and Physical   Primary Care Physician:  Johnny Garnette LABOR, MD   Reason for Procedure:  History of colon adenoma  Plan:    Colonoscopy     HPI: Jeremy [name] is a 73 y.o. male status post removal of an adenomatous colon polyp in 2009.  He did not have any polyps in 2015.  Her surveillance exam.  He takes clopidogrel  which has been held.   Past Medical History:  Diagnosis Date   Colon polyp 07/05/2007   TUBULAR ADENOMA   Coronary artery disease    Cardiac catheterization 8/07: Proximal LAD 50-75%, mid LAD 60%, ostial septal perforator 80%, proximal RCA 50% and then 80-85%, distal RCA 80-90%, EF 55-60%.  PCI was performed at that time colon distal RCA treated with a Taxus drug-eluting stent.  The proximal RCA treated with a Taxus drug eluting stent.  The LAD was studied with IVUS and treated with a Taxus drug eluding stent.     Hyperlipidemia    Ill-defined closed fractures of upper limb    Sinus bradycardia    sees Dr. Fernande    Syncope     Past Surgical History:  Procedure Laterality Date   APPENDECTOMY     COLONOSCOPY  06-20-13   per Dr. Avram, clear, repeat in 10 yrs    CORONARY ANGIOPLASTY       Current Outpatient Medications  Medication Sig Dispense Refill   aspirin 81 MG tablet Take 81 mg by mouth daily.     ezetimibe  (ZETIA ) 10 MG tablet TAKE 1 TABLET DAILY 90 tablet 3   fluticasone  (FLONASE ) 50 MCG/ACT nasal spray Place 2 sprays into the nose as directed. 48 g 3   hydrochlorothiazide  (MICROZIDE ) 12.5 MG capsule TAKE 1 CAPSULE DAILY 90 capsule 3   rosuvastatin  (CRESTOR ) 10 MG tablet Take 1 tablet (10 mg total) by mouth daily. 90 tablet 3   clopidogrel  (PLAVIX ) 75 MG tablet TAKE 1 TABLET DAILY 90 tablet 3   sildenafil  (VIAGRA ) 100 MG tablet Take 1 tablet (100 mg total) by mouth as needed for erectile dysfunction. 10 tablet 5   Current Facility-Administered Medications  Medication Dose Route Frequency Provider Last Rate Last Admin   0.9  %  sodium chloride  infusion  500 mL Intravenous Once Avram Lupita BRAVO, MD        Allergies as of 09/05/2023   (No Known Allergies)    Family History  Problem Relation Age of Onset   Prostate cancer Father    Cancer Father        prostate cancer w/ mets   Heart disease Father        CAD   Hyperlipidemia Father    Stroke Mother    Colon cancer Maternal Uncle 60   Heart attack Paternal Grandmother    COPD Neg Hx    Diabetes Neg Hx     Social History   Socioeconomic History   Marital status: Married    Spouse name: Not on file   Number of children: 0   Years of education: 16   Highest education level: Some college, no degree  Occupational History   Occupation: Therapist, sports: [employer]    Comment: Full time   Occupation: retired  Tobacco Use   Smoking status: Former   Smokeless tobacco: Never  Advertising account planner   Vaping status: Never Used  Substance and Sexual Activity   Alcohol use: Yes    Alcohol/week: 0.0 standard drinks of alcohol  Comment: occ   Drug use: No   Sexual activity: Yes    Partners: Female  Other Topics Concern   Not on file  Social History Narrative   HSG, [employer]. Married ''89. No children. Occupation: Forensic scientist      04/02/2018: Lives with wife in 3 story house   Has family scattered around the [place]     Works FT as Forensic scientist, enjoys working still   Agricultural engineer to gym 3days/week   Enjoys reading   Social Drivers of Corporate investment banker Strain: Low Risk  (04/02/2023)   Overall Financial Resource Strain (CARDIA)    Difficulty of Paying Living Expenses: Not hard at all  Food Insecurity: No Food Insecurity (04/02/2023)   Hunger Vital Sign    Worried About Running Out of Food in the Last Year: Never true    Ran Out of Food in the Last Year: Never true  Transportation Needs: No Transportation Needs (04/02/2023)   PRAPARE - Administrator, Civil Service (Medical): No    Lack of  Transportation (Non-Medical): No  Physical Activity: Sufficiently Active (04/02/2023)   Exercise Vital Sign    Days of Exercise per Week: 3 days    Minutes of Exercise per Session: 90 min  Stress: No Stress Concern Present (04/02/2023)   [employer] of Occupational Health - Occupational Stress Questionnaire    Feeling of Stress : Not at all  Social Connections: Moderately Integrated (04/02/2023)   Social Connection and Isolation Panel    Frequency of Communication with Friends and Family: More than three times a week    Frequency of Social Gatherings with Friends and Family: Once a week    Attends Religious Services: Never    Database administrator or Organizations: Yes    Attends Engineer, structural: More than 4 times per year    Marital Status: Married  Catering manager Violence: Not At Risk (05/07/2020)   Humiliation, Afraid, Rape, and Kick questionnaire    Fear of Current or Ex-Partner: No    Emotionally Abused: No    Physically Abused: No    Sexually Abused: No    Review of Systems:  All other review of systems negative except as mentioned in the HPI.  Physical Exam: Vital signs BP 135/70   Pulse (!) 45   Temp (!) 97.3 F (36.3 C)   Ht 5' 10 (1.778 m)   Wt 177 lb (80.3 kg)   SpO2 96%   BMI 25.40 kg/m   General:   Alert,  Well-developed, well-nourished, pleasant and cooperative in NAD Lungs:  Clear throughout to auscultation.   Heart:  Regular rate and rhythm; no murmurs, clicks, rubs,  or gallops. Abdomen:  Soft, nontender and nondistended. Normal bowel sounds.   Neuro/Psych:  Alert and cooperative. Normal mood and affect. A and O x 3   @Adeli Frost  CHARLENA Commander, MD, [facility] [phone] (pager) 09/05/2023 8:48 AM@

## 2023-09-05 ENCOUNTER — Encounter: Payer: Self-pay | Admitting: Internal Medicine

## 2023-09-05 ENCOUNTER — Ambulatory Visit (AMBULATORY_SURGERY_CENTER): Admitting: Internal Medicine

## 2023-09-05 VITALS — BP 122/59 | HR 44 | Temp 97.3°F | Resp 21 | Ht 70.0 in | Wt 177.0 lb

## 2023-09-05 DIAGNOSIS — Z860101 Personal history of adenomatous and serrated colon polyps: Secondary | ICD-10-CM

## 2023-09-05 DIAGNOSIS — Z1211 Encounter for screening for malignant neoplasm of colon: Secondary | ICD-10-CM

## 2023-09-05 DIAGNOSIS — K5730 Diverticulosis of large intestine without perforation or abscess without bleeding: Secondary | ICD-10-CM | POA: Diagnosis not present

## 2023-09-05 DIAGNOSIS — Z860100 Personal history of colon polyps, unspecified: Secondary | ICD-10-CM

## 2023-09-05 DIAGNOSIS — N400 Enlarged prostate without lower urinary tract symptoms: Secondary | ICD-10-CM

## 2023-09-05 DIAGNOSIS — E785 Hyperlipidemia, unspecified: Secondary | ICD-10-CM | POA: Diagnosis not present

## 2023-09-05 MED ORDER — SODIUM CHLORIDE 0.9 % IV SOLN: 500.0000 mL | Freq: Once | INTRAVENOUS | Status: DC

## 2023-09-05 NOTE — Progress Notes (Signed)
 Vss nad trans to pacu

## 2023-09-05 NOTE — Patient Instructions (Addendum)
 I am pleased to tell you that there were no polyps or cancer seen.  You do have diverticulosis - thickened muscle rings and pouches in the colon wall. Please read the handout about this condition.  No routine repeat colonoscopy is needed.  Resume clopidogrel  today.  I appreciate the opportunity to care for you. Lupita CHARLENA Commander, MD, [facility]  Resume previous diet. Continue present medications.  Resume Plavix  at prior dose today. Handout provided on diverticulosis.  YOU HAD AN ENDOSCOPIC PROCEDURE TODAY AT THE [facility]:   Refer to the procedure report that was given to you for any specific questions about what was found during the examination.  If the procedure report does not answer your questions, please call your gastroenterologist to clarify.  If you requested that your care partner not be given the details of your procedure findings, then the procedure report has been included in a sealed envelope for you to review at your convenience later.  YOU SHOULD EXPECT: Some feelings of bloating in the abdomen. Passage of more gas than usual.  Walking can help get rid of the air that was put into your GI tract during the procedure and reduce the bloating. If you had a lower endoscopy (such as a colonoscopy or flexible sigmoidoscopy) you may notice spotting of blood in your stool or on the toilet paper. If you underwent a bowel prep for your procedure, you may not have a normal bowel movement for a few days.  Please Note:  You might notice some irritation and congestion in your nose or some drainage.  This is from the oxygen used during your procedure.  There is no need for concern and it should clear up in a day or so.  SYMPTOMS TO REPORT IMMEDIATELY:  Following lower endoscopy (colonoscopy or flexible sigmoidoscopy):  Excessive amounts of blood in the stool  Significant tenderness or worsening of abdominal pains  Swelling of the abdomen that is new, acute  Fever of 100F or  higher  For urgent or emergent issues, a gastroenterologist can be reached at any hour by calling [phone]. Do not use MyChart messaging for urgent concerns.    DIET:  We do recommend a small meal at first, but then you may proceed to your regular diet.  Drink plenty of fluids but you should avoid alcoholic beverages for 24 hours.  ACTIVITY:  You should plan to take it easy for the rest of today and you should NOT DRIVE or use heavy machinery until tomorrow (because of the sedation medicines used during the test).    FOLLOW UP: Our staff will call the number listed on your records the next business day following your procedure.  We will call around 7:15- 8:00 am to check on you and address any questions or concerns that you may have regarding the information given to you following your procedure. If we do not reach you, we will leave a message.     If any biopsies were taken you will be contacted by phone or by letter within the next 1-3 weeks.  Please call us  at [phone] if you have not heard about the biopsies in 3 weeks.    SIGNATURES/CONFIDENTIALITY: You and/or your care partner have signed paperwork which will be entered into your electronic medical record.  These signatures attest to the fact that that the information above on your After Visit Summary has been reviewed and is understood.  Full responsibility of the confidentiality of this discharge information lies  with you and/or your care-partner.

## 2023-09-05 NOTE — Progress Notes (Signed)
 Pt's states no medical or surgical changes since previsit or office visit.

## 2023-09-05 NOTE — Op Note (Signed)
 [facility] Patient Name: Jeremy [name] Procedure Date: 09/05/2023 8:50 AM MRN: [id number] Endoscopist: Lupita FORBES Commander , MD, [id number] Age: 73 Referring MD:  Date of Birth: 1950/12/14 Gender: Male Account #: [id number] Procedure:                Colonoscopy Indications:              Screening for colorectal malignant neoplasm, Last                            colonoscopy: 2015 Medicines:                Monitored Anesthesia Care Procedure:                Pre-Anesthesia Assessment:                           - Prior to the procedure, a History and Physical                            was performed, and patient medications and                            allergies were reviewed. The patient's tolerance of                            previous anesthesia was also reviewed. The risks                            and benefits of the procedure and the sedation                            options and risks were discussed with the patient.                            All questions were answered, and informed consent                            was obtained. Prior Anticoagulants: The patient                            last took Plavix  (clopidogrel ) 5 days prior to the                            procedure. ASA Grade Assessment: III - A patient                            with severe systemic disease. After reviewing the                            risks and benefits, the patient was deemed in                            satisfactory condition to undergo the procedure.  After obtaining informed consent, the colonoscope                            was passed under direct vision. Throughout the                            procedure, the patient's blood pressure, pulse, and                            oxygen saturations were monitored continuously. The                            Olympus Scope [id number] was introduced through the                            anus and advanced to the the  cecum, identified by                            appendiceal orifice and ileocecal valve. The                            colonoscopy was performed without difficulty. The                            patient tolerated the procedure well. The quality                            of the bowel preparation was good. The ileocecal                            valve, appendiceal orifice, and rectum were                            photographed. The bowel preparation used was SUPREP                            via split dose instruction. Scope In: 8:54:19 AM Scope Out: 9:09:00 AM Scope Withdrawal Time: 0 hours 0 minutes 8 seconds  Total Procedure Duration: 0 hours 14 minutes 41 seconds  Findings:                 The digital rectal exam findings include enlarged                            prostate. Pertinent negatives include no palpable                            rectal lesions.                           Multiple medium-mouthed and small-mouthed                            diverticula were found in the sigmoid colon.  The exam was otherwise without abnormality on                            direct and retroflexion views. Complications:            No immediate complications. Estimated Blood Loss:     Estimated blood loss: none. Impression:               - Enlarged prostate found on digital rectal exam.                           - Diverticulosis in the sigmoid colon.                           - The examination was otherwise normal on direct                            and retroflexion views.                           - No specimens collected. Recommendation:           - Patient has a contact number available for                            emergencies. The signs and symptoms of potential                            delayed complications were discussed with the                            patient. Return to normal activities tomorrow.                            Written discharge  instructions were provided to the                            patient.                           - Resume previous diet.                           - Continue present medications.                           - No repeat colonoscopy due to current age (27                            years or older) and the absence of colonic polyps.                           - Resume Plavix  (clopidogrel ) at prior dose today. Lupita FORBES Commander, MD 09/05/2023 9:20:37 AM This report has been signed electronically.

## 2023-09-06 ENCOUNTER — Telehealth: Payer: Self-pay

## 2023-09-06 NOTE — Telephone Encounter (Signed)
 Follow up call to pt, no answer.

## 2023-10-14 ENCOUNTER — Other Ambulatory Visit: Payer: Self-pay | Admitting: Family Medicine

## 2023-10-23 ENCOUNTER — Other Ambulatory Visit (HOSPITAL_BASED_OUTPATIENT_CLINIC_OR_DEPARTMENT_OTHER): Payer: Self-pay

## 2023-10-23 MED ORDER — COMIRNATY 30 MCG/0.3ML IM SUSY
0.3000 mL | PREFILLED_SYRINGE | Freq: Once | INTRAMUSCULAR | 0 refills | Status: AC
  Filled 2023-10-23: qty 0.3, 1d supply, fill #0

## 2023-11-09 ENCOUNTER — Other Ambulatory Visit (HOSPITAL_BASED_OUTPATIENT_CLINIC_OR_DEPARTMENT_OTHER): Payer: Self-pay

## 2023-11-09 MED ORDER — FLUZONE HIGH-DOSE 0.5 ML IM SUSY
0.5000 mL | PREFILLED_SYRINGE | Freq: Once | INTRAMUSCULAR | 0 refills | Status: AC
  Filled 2023-11-09: qty 0.5, 1d supply, fill #0

## 2023-11-24 ENCOUNTER — Other Ambulatory Visit (HOSPITAL_BASED_OUTPATIENT_CLINIC_OR_DEPARTMENT_OTHER): Payer: Self-pay

## 2023-11-24 MED ORDER — SHINGRIX 50 MCG/0.5ML IM SUSR
0.5000 mL | Freq: Once | INTRAMUSCULAR | 0 refills | Status: AC
  Filled 2023-11-24: qty 0.5, 1d supply, fill #0

## 2023-12-01 ENCOUNTER — Other Ambulatory Visit: Payer: Self-pay | Admitting: Pharmacist Clinician (PhC)/ Clinical Pharmacy Specialist

## 2023-12-01 ENCOUNTER — Other Ambulatory Visit: Payer: Self-pay | Admitting: Family Medicine

## 2023-12-01 MED ORDER — ROSUVASTATIN CALCIUM 10 MG PO TABS: 10.0000 mg | ORAL_TABLET | Freq: Every day | ORAL | 1 refills | Status: DC

## 2024-01-02 ENCOUNTER — Ambulatory Visit: Admitting: Family Medicine

## 2024-01-02 DIAGNOSIS — Z0000 Encounter for general adult medical examination without abnormal findings: Secondary | ICD-10-CM

## 2024-01-02 NOTE — Patient Instructions (Addendum)
 I really enjoyed getting to talk with you today! I am available on Tuesdays and Thursdays for virtual visits if you have any questions or concerns, or if I can be of any further assistance.   CHECKLIST FROM ANNUAL WELLNESS VISIT:  -Follow up (please call to schedule if not scheduled after visit):   -yearly for annual wellness visit with primary care office  Here is a list of your preventive care/health maintenance measures and the plan for each if any are due:  PLAN For any measures below that may be due:    1. Can get the tetanus and prevnar 20 at the pharmacy if not already done. Can get 2nd does of shingles when due. Please let us  know if you do so that we can update your record.   Health Maintenance  Topic Date Due   DTaP/Tdap/Td (2 - Tdap) 01/31/2024 (Originally 03/04/2019)   Pneumococcal Vaccine: 50+ Years (3 of 3 - PCV20 or PCV21) 04/30/2024 (Originally 04/19/2018)   Zoster Vaccines- Shingrix  (2 of 2) 01/19/2024   COVID-19 Vaccine (8 - Pfizer risk 2025-26 season) 04/21/2024   Medicare Annual Wellness (AWV)  01/01/2025   Influenza Vaccine  Completed   Hepatitis C Screening  Completed   Meningococcal B Vaccine  Aged Out   Colonoscopy  Discontinued    -See a dentist at least yearly  -Get your eyes checked and then per your eye specialist's recommendations  -Other issues addressed today:   1. Consider adding in strength training 2 days per week.    -I have included below further information regarding a healthy whole foods based diet, physical activity guidelines for adults, stress management and opportunities for social connections. I hope you find this information useful.   -----------------------------------------------------------------------------------------------------------------------------------------------------------------------------------------------------------------------------------------------------------    NUTRITION: -eat real food: lots of colorful  vegetables (half the plate) and fruits -5-7 servings of vegetables and fruits per day (fresh or steamed is best), exp. 2 servings of vegetables with lunch and dinner and 2 servings of fruit per day. Berries and greens such as kale and collards are great choices.  -consume on a regular basis:  fresh fruits, fresh veggies, fish, nuts, seeds, healthy oils (such as olive oil, avocado oil), whole grains (make sure for bread/pasta/crackers/etc., that the first ingredient on label contains the word whole), legumes. -can eat small amounts of dairy and lean meat (no larger than the palm of your hand), but avoid processed meats such as ham, bacon, lunch meat, etc. -drink water -try to avoid fast food and pre-packaged foods, processed meat, ultra processed foods/beverages (donuts, candy, etc.) -most experts advise limiting sodium to < 2300mg  per day, should limit further is any chronic conditions such as high blood pressure, heart disease, diabetes, etc. The American Heart Association advised that < 1500mg  is is ideal -try to avoid foods/beverages that contain any ingredients with names you do not recognize  -try to avoid foods/beverages  with added sugar or sweeteners/sweets  -try to avoid sweet drinks (including diet drinks): soda, juice, Gatorade, sweet tea, power drinks, diet drinks -try to avoid white rice, white bread, pasta (unless whole grain)  EXERCISE GUIDELINES FOR ADULTS: -if you wish to increase your physical activity, do so gradually and with the approval of your doctor -STOP and seek medical care immediately if you have any chest pain, chest discomfort or trouble breathing when starting or increasing exercise  -move and stretch your body, legs, feet and arms when sitting for long periods -Physical activity guidelines for optimal health in adults: -get  at least 150 minutes per week of moderate exercise (can talk, but not sing); this is about 20-30 minutes of sustained activity 5-7 days per week  or two 10-15 minute episodes of sustained activity 5-7 days per week -do some muscle building/resistance training/strength training at least 2 days per week  -balance exercises 3+ days per week:   Stand somewhere where you have something sturdy to hold onto if you lose balance    1) lift up on toes, then back down, start with 5x per day and work up to 20x   2) stand and lift one leg straight out to the side so that foot is a few inches of the floor, start with 5x each side and work up to 20x each side   3) stand on one foot, start with 5 seconds each side and work up to 20 seconds on each side  If you need ideas or help with getting more active:  -Silver sneakers https://tools.silversneakers.com  -Walk with a Doc: Http://www.duncan-williams.com/  -try to include resistance (weight lifting/strength building) and balance exercises twice per week: or the following link for ideas: http://castillo-powell.com/  buyducts.dk  STRESS MANAGEMENT: -can try meditating, or just sitting quietly with deep breathing while intentionally relaxing all parts of your body for 5 minutes daily -if you need further help with stress, anxiety or depression please follow up with your primary doctor or contact the wonderful folks at [facility]: [phone]  SOCIAL CONNECTIONS: -options in [place] if you wish to engage in more social and exercise related activities:  -Silver sneakers https://tools.silversneakers.com  -Walk with a Doc: Http://www.duncan-williams.com/  -Check out the [facility] Active Adults 50+ section on the [place] [employer] (hiking clubs, book clubs, cards and games, chess, exercise classes, aquatic classes and much more) - see the website for details: https://www.[place].gov/departments/parks-recreation/active-adults50  -YouTube has lots of exercise videos for different ages and  abilities as well  -[facility] (a variety of indoor and outdoor inperson activities for adults). ([phone]. [address].  -Virtual Online Classes (a variety of topics): see seniorplanet.org or call [phone]  -consider volunteering at a school, hospice center, church, senior center or elsewhere

## 2024-01-02 NOTE — Progress Notes (Signed)
 To check-in Nursing staff: Please review Meds, Allergies, PMH, and care teams and update Please request wt, home BP, etc and update vitals if able Please complete the following flowsheets under the Medicare Visits Tab:  Medicare Wellness  Stress  PHQ-2-9  Exercise  Social Connections  Method of visit: Not In Person  ----------------------------------------------------------------------------------------------------------------------------------------------------------------------------------------------------------------------  Because this visit was a virtual/telehealth visit, some criteria may be missing or patient reported. Any vitals not documented were not able to be obtained and vitals that have been documented are patient reported.    MEDICARE ANNUAL PREVENTIVE CARE VISIT WITH PROVIDER (Welcome to Medicare, initial annual wellness or annual wellness exam)  Virtual Visit via Video Note  I connected with Saiquan C Coyne on 01/02/24  by a video enabled telemedicine application and verified that I am speaking with the correct person using two identifiers.  Location patient: home Location provider:work or home office Persons participating in the virtual visit: patient, provider  Concerns and/or follow up today: detailed intake and health/risks assessment completed on flow sheets and below- please see for details.   How often do you have a drink containing alcohol?1 glass of wine per month How many drinks containing alcohol do you have on a typical day when you are drinking?na How often do you have six or more drinks on one occasion?na Have you ever smoked?y Quit date if applicable? 1990  How many packs a day do/did you smoke? na Do you use smokeless tobacco?na Do you use an illicit drugs?n Do you feel safe at home?y Last dentist visit?goes on a regular basis Last eye Exam and location?Dr. Abigail, on a regular basis   See HM section in Epic for other details of completed HM.     ROS: negative for report of fevers, unintentional weight loss, vision changes, vision loss, hearing loss or change, chest pain, sob, hemoptysis, melena, hematochezia, hematuria,bleeding or bruising  Patient-completed extensive health risk assessment - reviewed and discussed with the patient: See Health Risk Assessment completed with patient prior to the visit either above or in recent phone note. This was reviewed in detailed with the patient today and appropriate recommendations, orders and referrals were placed as needed per Summary below and patient instructions.   Review of Medical History: -PMH, PSH, Family History and current specialty and care providers reviewed and updated and listed below   Patient Care Team: Johnny Garnette LABOR, MD as PCP - General (Family Medicine) Fernande Elspeth BROCKS, MD (Inactive) as Consulting Physician (Cardiology)   Past Medical History:  Diagnosis Date   Colon polyp 07/05/2007   TUBULAR ADENOMA   Coronary artery disease    Cardiac catheterization 8/07: Proximal LAD 50-75%, mid LAD 60%, ostial septal perforator 80%, proximal RCA 50% and then 80-85%, distal RCA 80-90%, EF 55-60%.  PCI was performed at that time colon distal RCA treated with a Taxus drug-eluting stent.  The proximal RCA treated with a Taxus drug eluting stent.  The LAD was studied with IVUS and treated with a Taxus drug eluding stent.     Hyperlipidemia    Ill-defined closed fractures of upper limb    Sinus bradycardia    sees Dr. Fernande    Syncope     Past Surgical History:  Procedure Laterality Date   APPENDECTOMY     COLONOSCOPY  06-20-13   per Dr. Avram, clear, repeat in 10 yrs    CORONARY ANGIOPLASTY      Social History   Socioeconomic History   Marital status: Married  Spouse name: Not on file   Number of children: 0   Years of education: 35   Highest education level: Some college, no degree  Occupational History   Occupation: Therapist, Sports: [employer]     Comment: Full time   Occupation: retired  Tobacco Use   Smoking status: Former   Smokeless tobacco: Never  Advertising Account Planner   Vaping status: Never Used  Substance and Sexual Activity   Alcohol use: Yes    Alcohol/week: 0.0 standard drinks of alcohol    Comment: occ   Drug use: No   Sexual activity: Yes    Partners: Female  Other Topics Concern   Not on file  Social History Narrative   HS[employer], [employer] [place]. Married ''89. No children. Occupation: forensic scientist      04/02/2018: Lives with wife in 3 story house   Has family scattered around the [place]     Works FT as forensic scientist, enjoys working still   [employer] to gym 3days/week   Enjoys reading   Social Drivers of Corporate Investment Banker Strain: Low Risk  (12/29/2023)   Overall Financial Resource Strain (CARDIA)    Difficulty of Paying Living Expenses: Not hard at all  Food Insecurity: Unknown (12/29/2023)   Hunger Vital Sign    Worried About Running Out of Food in the Last Year: Never true    Ran Out of Food in the Last Year: Not on file  Transportation Needs: No Transportation Needs (12/29/2023)   PRAPARE - Administrator, Civil Service (Medical): No    Lack of Transportation (Non-Medical): No  Physical Activity: Sufficiently Active (12/29/2023)   Exercise Vital Sign    Days of Exercise per Week: 3 days    Minutes of Exercise per Session: 120 min  Stress: No Stress Concern Present (12/29/2023)   [employer] of Occupational Health - Occupational Stress Questionnaire    Feeling of Stress: Not at all  Social Connections: Moderately Integrated (12/29/2023)   Social Connection and Isolation Panel    Frequency of Communication with Friends and Family: Three times a week    Frequency of Social Gatherings with Friends and Family: Once a week    Attends Religious Services: Never    Database Administrator or Organizations: Yes    Attends Engineer, Structural: More than 4 times per  year    Marital Status: Married  Catering Manager Violence: Not At Risk (05/07/2020)   Humiliation, Afraid, Rape, and Kick questionnaire    Fear of Current or Ex-Partner: No    Emotionally Abused: No    Physically Abused: No    Sexually Abused: No    Family History  Problem Relation Age of Onset   Prostate cancer Father    Cancer Father        prostate cancer w/ mets   Heart disease Father        CAD   Hyperlipidemia Father    Stroke Mother    Colon cancer Maternal Uncle 60   Heart attack Paternal Grandmother    COPD Neg Hx    Diabetes Neg Hx     Current Outpatient Medications on File Prior to Visit  Medication Sig Dispense Refill   aspirin 81 MG tablet Take 81 mg by mouth daily.     clopidogrel  (PLAVIX ) 75 MG tablet TAKE 1 TABLET DAILY 90 tablet 3   ezetimibe  (ZETIA ) 10 MG tablet TAKE 1 TABLET DAILY 90  tablet 1   fluticasone  (FLONASE ) 50 MCG/ACT nasal spray Place 2 sprays into the nose as directed. 48 g 3   hydrochlorothiazide  (MICROZIDE ) 12.5 MG capsule TAKE 1 CAPSULE DAILY 90 capsule 3   rosuvastatin  (CRESTOR ) 10 MG tablet Take 1 tablet (10 mg total) by mouth daily. 90 tablet 1   sildenafil  (VIAGRA ) 100 MG tablet Take 1 tablet (100 mg total) by mouth as needed for erectile dysfunction. 10 tablet 5   No current facility-administered medications on file prior to visit.    No Known Allergies     Physical Exam Vitals requested from patient and listed below if patient had equipment and was able to obtain at home for this virtual visit: There were no vitals filed for this visit. Estimated body mass index is 25.4 kg/m as calculated from the following:   Height as of 09/05/23: 5' 10 (1.778 m).   Weight as of 09/05/23: 177 lb (80.3 kg).  EKG (optional): deferred due to virtual visit  GENERAL: alert, oriented, no acute distress detected; full vision exam deferred due to pandemic and/or virtual encounter   HEENT: atraumatic, conjunttiva clear, no obvious abnormalities on  inspection of external nose and ears  NECK: normal movements of the head and neck  LUNGS: on inspection no signs of respiratory distress, breathing rate appears normal, no obvious gross SOB, gasping or wheezing  CV: no obvious cyanosis  MS: moves all visible extremities without noticeable abnormality  PSYCH/NEURO: pleasant and cooperative, no obvious depression or anxiety, speech and thought processing grossly intact, Cognitive function grossly intact  Flowsheet Row Video Visit from 04/14/2022 in [facility]  PHQ-9 Total Score 1        01/02/2024   11:51 AM 04/14/2022   10:04 AM 10/08/2021    8:19 AM 04/12/2021    4:08 PM 05/07/2020   11:22 AM  Depression screen PHQ 2/9  Decreased Interest 0 0 0 0 0  Down, Depressed, Hopeless 0 0 0 0 0  PHQ - 2 Score 0 0 0 0 0  Altered sleeping  1 2    Tired, decreased energy  0 0    Change in appetite  0 0    Feeling bad or failure about yourself   0 0    Trouble concentrating  0 0    Moving slowly or fidgety/restless  0 0    Suicidal thoughts  0 0    PHQ-9 Score  1  2     Difficult doing work/chores  Not difficult at all Not difficult at all       Data saved with a previous flowsheet row definition       10/08/2021    8:19 AM 04/14/2022   10:04 AM 04/06/2023    8:55 AM 12/29/2023   11:10 AM 01/02/2024   11:46 AM  Fall Risk  Falls in the past year? 1 0 0 0   Was there an injury with Fall? 0  0  0   0  Fall Risk Category Calculator 1 0 0    Fall Risk Category (Retired) Low       (RETIRED) Patient Fall Risk Level Low fall risk       Patient at Risk for Falls Due to No Fall Risks  No Fall Risks  No Fall Risks  Fall risk Follow up Falls evaluation completed   Falls evaluation completed       Data saved with a previous flowsheet row definition  SUMMARY AND PLAN:  Encounter for Medicare annual wellness exam   Discussed applicable health maintenance/preventive health measures and advised and referred or  ordered per patient preferences: -discussed vaccines due - he plans to check with pharmacy and get if not already done Health Maintenance  Topic Date Due   DTaP/Tdap/Td (2 - Tdap) 01/31/2024 (Originally 03/04/2019)   Pneumococcal Vaccine: 50+ Years (3 of 3 - PCV20 or PCV21) 04/30/2024 (Originally 04/19/2018)   Zoster Vaccines- Shingrix  (2 of 2) 01/19/2024   COVID-19 Vaccine (8 - Pfizer risk 2025-26 season) 04/21/2024   Medicare Annual Wellness (AWV)  01/01/2025   Influenza Vaccine  Completed   Hepatitis C Screening  Completed   Meningococcal B Vaccine  Aged Out   Colonoscopy  Discontinued     Education and counseling on the following was provided based on the above review of health and a plan/checklist for the patient, along with additional information discussed, was provided for the patient in the patient instructions :    -Advised and counseled on a healthy lifestyle - including the importance of a healthy diet, regular physical activity, social connections and stress management. -Reviewed patient's current diet. Advised and counseled on a whole foods based healthy diet. A summary of a healthy diet was provided in the Patient Instructions.  -reviewed patient's current physical activity level and discussed exercise guidelines for adults. Encouragedto add in strength training 2 days per week. Discussed  ideas for safe exercise at home to assist in meeting exercise guideline and provided further info and community resources in patient instructions.  -Advise yearly dental visits at minimum and regular eye exams   Follow up: see patient instructions   Patient Instructions  I really enjoyed getting to talk with you today! I am available on Tuesdays and Thursdays for virtual visits if you have any questions or concerns, or if I can be of any further assistance.   CHECKLIST FROM ANNUAL WELLNESS VISIT:  -Follow up (please call to schedule if not scheduled after visit):   -yearly for annual  wellness visit with primary care office  Here is a list of your preventive care/health maintenance measures and the plan for each if any are due:  PLAN For any measures below that may be due:    1. Can get the tetanus and prevnar 20 at the pharmacy if not already done. Can get 2nd does of shingles when due. Please let us  know if you do so that we can update your record.   Health Maintenance  Topic Date Due   DTaP/Tdap/Td (2 - Tdap) 01/31/2024 (Originally 03/04/2019)   Pneumococcal Vaccine: 50+ Years (3 of 3 - PCV20 or PCV21) 04/30/2024 (Originally 04/19/2018)   Zoster Vaccines- Shingrix  (2 of 2) 01/19/2024   COVID-19 Vaccine (8 - Pfizer risk 2025-26 season) 04/21/2024   Medicare Annual Wellness (AWV)  01/01/2025   Influenza Vaccine  Completed   Hepatitis C Screening  Completed   Meningococcal B Vaccine  Aged Out   Colonoscopy  Discontinued    -See a dentist at least yearly  -Get your eyes checked and then per your eye specialist's recommendations  -Other issues addressed today:   1. Consider adding in strength training 2 days per week.    -I have included below further information regarding a healthy whole foods based diet, physical activity guidelines for adults, stress management and opportunities for social connections. I hope you find this information useful.   -----------------------------------------------------------------------------------------------------------------------------------------------------------------------------------------------------------------------------------------------------------    NUTRITION: -eat real food: lots of colorful vegetables (  half the plate) and fruits -5-7 servings of vegetables and fruits per day (fresh or steamed is best), exp. 2 servings of vegetables with lunch and dinner and 2 servings of fruit per day. Berries and greens such as kale and collards are great choices.  -consume on a regular basis:  fresh fruits, fresh veggies, fish,  nuts, seeds, healthy oils (such as olive oil, avocado oil), whole grains (make sure for bread/pasta/crackers/etc., that the first ingredient on label contains the word whole), legumes. -can eat small amounts of dairy and lean meat (no larger than the palm of your hand), but avoid processed meats such as ham, bacon, lunch meat, etc. -drink water -try to avoid fast food and pre-packaged foods, processed meat, ultra processed foods/beverages (donuts, candy, etc.) -most experts advise limiting sodium to < 2300mg  per day, should limit further is any chronic conditions such as high blood pressure, heart disease, diabetes, etc. The American Heart Association advised that < 1500mg  is is ideal -try to avoid foods/beverages that contain any ingredients with names you do not recognize  -try to avoid foods/beverages  with added sugar or sweeteners/sweets  -try to avoid sweet drinks (including diet drinks): soda, juice, Gatorade, sweet tea, power drinks, diet drinks -try to avoid white rice, white bread, pasta (unless whole grain)  EXERCISE GUIDELINES FOR ADULTS: -if you wish to increase your physical activity, do so gradually and with the approval of your doctor -STOP and seek medical care immediately if you have any chest pain, chest discomfort or trouble breathing when starting or increasing exercise  -move and stretch your body, legs, feet and arms when sitting for long periods -Physical activity guidelines for optimal health in adults: -get at least 150 minutes per week of moderate exercise (can talk, but not sing); this is about 20-30 minutes of sustained activity 5-7 days per week or two 10-15 minute episodes of sustained activity 5-7 days per week -do some muscle building/resistance training/strength training at least 2 days per week  -balance exercises 3+ days per week:   Stand somewhere where you have something sturdy to hold onto if you lose balance    1) lift up on toes, then back down, start  with 5x per day and work up to 20x   2) stand and lift one leg straight out to the side so that foot is a few inches of the floor, start with 5x each side and work up to 20x each side   3) stand on one foot, start with 5 seconds each side and work up to 20 seconds on each side  If you need ideas or help with getting more active:  -Silver sneakers https://tools.silversneakers.com  -Walk with a Doc: Http://www.duncan-williams.com/  -try to include resistance (weight lifting/strength building) and balance exercises twice per week: or the following link for ideas: http://castillo-powell.com/  buyducts.dk  STRESS MANAGEMENT: -can try meditating, or just sitting quietly with deep breathing while intentionally relaxing all parts of your body for 5 minutes daily -if you need further help with stress, anxiety or depression please follow up with your primary doctor or contact the wonderful folks at [facility]: [phone]  SOCIAL CONNECTIONS: -options in [place] if you wish to engage in more social and exercise related activities:  -Silver sneakers https://tools.silversneakers.com  -Walk with a Doc: Http://www.duncan-williams.com/  -Check out the [facility] Active Adults 50+ section on the [place] of [employer] (hiking clubs, book clubs, cards and games, chess, exercise classes, aquatic classes and much more) - see the  website for details: https://www.[place].gov/departments/parks-recreation/active-adults50  -YouTube has lots of exercise videos for different ages and abilities as well  -[facility] (a variety of indoor and outdoor inperson activities for adults). [phone]. [address].  -Virtual Online Classes (a variety of topics): see seniorplanet.org or call [phone]  -consider volunteering at a school, hospice center, church, senior center or  elsewhere            Chiquita JONELLE Cramp, DO

## 2024-01-10 ENCOUNTER — Ambulatory Visit: Payer: Self-pay

## 2024-01-10 NOTE — Telephone Encounter (Signed)
 FYI Only or Action Required?: FYI only for provider: appointment scheduled on 01/12/24.  Patient was last seen in primary care on 01/02/2024 by Jeremy Chiquita SAUNDERS, DO.  Called Nurse Triage reporting Shortness of Breath.  Symptoms began several weeks ago.  Interventions attempted: Prescription medications: inhaler.  Symptoms are: unchanged.  Triage Disposition: See PCP Within 2 Weeks  Patient/caregiver understands and will follow disposition?:    Copied from CRM #[id number]. Topic: Clinical - Red Word Triage >> Jan 10, 2024  4:34 PM Drema MATSU wrote: Red Word that prompted transfer to Nurse Triage: Patient is experiencing shortness of breath when he exercise. He stated that he gets short winded easily.   Reason for Disposition  [1] MILD longstanding difficulty breathing (e.g., minimal/no SOB at rest, SOB with walking, pulse < 100) AND [2] SAME as normal  Answer Assessment - Initial Assessment Questions Pt called in requesting an appt with PCP. States that he is active and is experiencing worsening SOB with exertion, no longer just with exercise. Pt states he has a hx of exercise induced asthma, he confirms he does have an inhaler. Pt denies chest pain or tightening, no other symptoms associated with SOB. Appointment scheduled for evaluation. Patient agrees with plan of care, and will call back if anything changes, or if symptoms worsen.    1. RESPIRATORY STATUS: Describe your breathing? (e.g., wheezing, shortness of breath, unable to speak, severe coughing)      SOB with exercise and exertion  2. ONSET: When did this breathing problem begin?      Hx of exercise induced asthma but reports SOB seems with any exertion   3. PATTERN Does the difficult breathing come and go, or has it been constant since it started?      Comes and goes; only with exertion   4. SEVERITY: How bad is your breathing? (e.g., mild, moderate, severe)      Mild   5. RECURRENT SYMPTOM: Have you had  difficulty breathing before? If Yes, ask: When was the last time? and What happened that time?      Yes; hx of exercise induced asthma   9. OTHER SYMPTOMS: Do you have any other symptoms? (e.g., chest pain, cough, dizziness, fever, runny nose)     No; denies chest pain or tightening  Protocols used: Breathing Difficulty-A-AH

## 2024-01-10 NOTE — Telephone Encounter (Signed)
 Appt scheduled with PCP on 12/12.

## 2024-01-12 ENCOUNTER — Ambulatory Visit (INDEPENDENT_AMBULATORY_CARE_PROVIDER_SITE_OTHER): Admitting: Family Medicine

## 2024-01-12 ENCOUNTER — Ambulatory Visit

## 2024-01-12 ENCOUNTER — Encounter: Payer: Self-pay | Admitting: Family Medicine

## 2024-01-12 VITALS — BP 136/76 | HR 56 | Temp 98.1°F | Ht 70.0 in | Wt 180.6 lb

## 2024-01-12 DIAGNOSIS — R001 Bradycardia, unspecified: Secondary | ICD-10-CM

## 2024-01-12 DIAGNOSIS — R0602 Shortness of breath: Secondary | ICD-10-CM | POA: Diagnosis not present

## 2024-01-12 NOTE — Progress Notes (Signed)
° °  Subjective:    Patient ID: Jeremy [name], male    DOB: [birth year], 73 y.o.   MRN: [id number]  HPI Here for 6 weeks of SOB when he exercises. He typically walks with his wife for 3.5 miles around his neighborhood several days a week. During the past 6 weeks however he has had frequent spells of SOB and fatigue, causing him to stop and rest briefly. He describes tightness in the chest during these spells, but he denies chest pain. He does not feel lightheaded during these times. He has no coughing or wheezing. No ankle swelling. His BP has been stable. His pulse is always low however. He keeps track of this with his Apple watch. His DR at rest is usually in the 40's or 50's. When he exercises it never gets above the 80's. His last EKG in March showed only sinus bradycardia. He has never had any type of stress testing.    Review of Systems  Constitutional: Negative.   Respiratory:  Positive for shortness of breath. Negative for cough and wheezing.   Cardiovascular: Negative.   Neurological: Negative.        Objective:   Physical Exam Constitutional:      Appearance: Normal appearance. He is well-developed.  Cardiovascular:     Rate and Rhythm: Regular rhythm. Bradycardia present.     Pulses: Normal pulses.     Heart sounds: Normal heart sounds.  Pulmonary:     Effort: Pulmonary effort is normal.     Breath sounds: Normal breath sounds.  Musculoskeletal:     Right lower leg: No edema.     Left lower leg: No edema.  Neurological:     General: No focal deficit present.     Mental Status: He is alert and oriented to person, place, and time.           Assessment & Plan:  He is having SOB with exertion. This could certainly be the result of cardiac ischemia, but I think the most likely etiology is the bradycardia. We will check labs and a CXR today. We will arrange for him to see Cardiology asap.  Garnette Olmsted, MD

## 2024-01-13 LAB — CBC WITH DIFFERENTIAL/PLATELET
Absolute Lymphocytes: 1498 {cells}/uL (ref 850–3900)
Absolute Monocytes: 634 {cells}/uL (ref 200–950)
Basophils Absolute: 53 {cells}/uL (ref 0–200)
Basophils Relative: 0.8 %
Eosinophils Absolute: 436 {cells}/uL (ref 15–500)
Eosinophils Relative: 6.6 %
HCT: 47.9 % (ref 39.4–51.1)
Hemoglobin: 15.6 g/dL (ref 13.2–17.1)
MCH: 29.8 pg (ref 27.0–33.0)
MCHC: 32.6 g/dL (ref 31.6–35.4)
MCV: 91.4 fL (ref 81.4–101.7)
MPV: 9.9 fL (ref 7.5–12.5)
Monocytes Relative: 9.6 %
Neutro Abs: 3980 {cells}/uL (ref 1500–7800)
Neutrophils Relative %: 60.3 %
Platelets: 254 Thousand/uL (ref 140–400)
RBC: 5.24 Million/uL (ref 4.20–5.80)
RDW: 13 % (ref 11.0–15.0)
Total Lymphocyte: 22.7 %
WBC: 6.6 Thousand/uL (ref 3.8–10.8)

## 2024-01-13 LAB — TSH: TSH: 1.64 m[IU]/L (ref 0.40–4.50)

## 2024-01-13 LAB — BASIC METABOLIC PANEL WITH GFR
BUN: 20 mg/dL (ref 7–25)
CO2: 30 mmol/L (ref 20–32)
Calcium: 9.4 mg/dL (ref 8.6–10.3)
Chloride: 97 mmol/L — ABNORMAL LOW (ref 98–110)
Creat: 0.86 mg/dL (ref 0.70–1.28)
Glucose, Bld: 84 mg/dL (ref 65–99)
Potassium: 4.2 mmol/L (ref 3.5–5.3)
Sodium: 138 mmol/L (ref 135–146)
eGFR: 91 mL/min/1.73m2 (ref >=60)

## 2024-01-15 ENCOUNTER — Ambulatory Visit: Admitting: Cardiology

## 2024-01-16 ENCOUNTER — Ambulatory Visit: Payer: Self-pay | Admitting: Family Medicine

## 2024-01-18 NOTE — Progress Notes (Unsigned)
°  °  Cardiology Office Note Date:  01/18/2024  ID:  Jeremy [name], DOB [birth year], MRN [id number] PCP:  Johnny Garnette LABOR, MD  Cardiologist:   Joelle VEAR Ren Donley, MD  No chief complaint on file.     Problems CAD s/p PCI prox-distal RCA and mid LAD (2007) Sinus bradycardia M: ASA81, CL75, EE10, HTZ12.5, RN10 L: L 3/25 53  Visits  12/25: Reported dyspnea with exertion to PCP    History of Present Illness: Jeremy [name] is a 73 y.o. male who presents for new visit.   ROS: Please see the history of present illness. All other systems are reviewed and negative.   Past Medical History:  Diagnosis Date   Colon polyp 07/05/2007   TUBULAR ADENOMA   Coronary artery disease    Cardiac catheterization 8/07: Proximal LAD 50-75%, mid LAD 60%, ostial septal perforator 80%, proximal RCA 50% and then 80-85%, distal RCA 80-90%, EF 55-60%.  PCI was performed at that time colon distal RCA treated with a Taxus drug-eluting stent.  The proximal RCA treated with a Taxus drug eluting stent.  The LAD was studied with IVUS and treated with a Taxus drug eluding stent.     Hyperlipidemia    Ill-defined closed fractures of upper limb    Sinus bradycardia    sees Dr. Fernande    Syncope     Past Surgical History:  Procedure Laterality Date   APPENDECTOMY     COLONOSCOPY  06-20-13   per Dr. Avram, clear, repeat in 10 yrs    CORONARY ANGIOPLASTY      Current Outpatient Medications  Medication Sig Dispense Refill   aspirin 81 MG tablet Take 81 mg by mouth daily.     clopidogrel  (PLAVIX ) 75 MG tablet TAKE 1 TABLET DAILY 90 tablet 3   EPINEPHrine (PRIMATENE MIST IN) Inhale into the lungs as needed.     ezetimibe  (ZETIA ) 10 MG tablet TAKE 1 TABLET DAILY 90 tablet 1   fluticasone  (FLONASE ) 50 MCG/ACT nasal spray Place 2 sprays into the nose as directed. 48 g 3   hydrochlorothiazide  (MICROZIDE ) 12.5 MG capsule TAKE 1 CAPSULE DAILY 90 capsule 3   rosuvastatin  (CRESTOR ) 10 MG tablet Take 1 tablet (10 mg  total) by mouth daily. 90 tablet 1   sildenafil  (VIAGRA ) 100 MG tablet Take 1 tablet (100 mg total) by mouth as needed for erectile dysfunction. 10 tablet 5   No current facility-administered medications for this visit.    Allergies:   Patient has no known allergies.   Social History:  see above  Family History:  see above  PHYSICAL EXAM: VS:  There were no vitals taken for this visit. , BMI There is no height or weight on file to calculate BMI. GEN: Well nourished, well developed, in no acute distress HEENT: normal Neck: no JVD, carotid bruits, or masses Cardiac: ***RRR; no murmurs, rubs, or gallops,no edema  Respiratory:  CTAB bilaterally, normal work of breathing GI: soft, nontender, nondistended, + BS Extremities: No LE edema Skin: warm and dry, no rash Neuro:  Strength and sensation are intact  EKG: ***  Recent Labs: Reviewed  Studies: Reviewed  ASSESSMENT AND PLAN: Jeremy [name] is a 73 y.o. male who presents for new visit.  - *** - *** - *** - ***   Signed, Joelle VEAR Ren Donley, MD  01/18/2024 9:41 AM     [facility]

## 2024-01-19 ENCOUNTER — Telehealth: Payer: Self-pay

## 2024-01-19 ENCOUNTER — Ambulatory Visit

## 2024-01-19 VITALS — BP 123/50 | HR 58 | Ht 70.0 in | Wt 182.6 lb

## 2024-01-19 DIAGNOSIS — I2510 Atherosclerotic heart disease of native coronary artery without angina pectoris: Secondary | ICD-10-CM

## 2024-01-19 DIAGNOSIS — I10 Essential (primary) hypertension: Secondary | ICD-10-CM

## 2024-01-19 DIAGNOSIS — E785 Hyperlipidemia, unspecified: Secondary | ICD-10-CM | POA: Diagnosis not present

## 2024-01-19 DIAGNOSIS — R0609 Other forms of dyspnea: Secondary | ICD-10-CM | POA: Diagnosis not present

## 2024-01-19 DIAGNOSIS — R001 Bradycardia, unspecified: Secondary | ICD-10-CM

## 2024-01-19 MED ORDER — NITROGLYCERIN 0.4 MG SL SUBL: 0.4000 mg | SUBLINGUAL_TABLET | SUBLINGUAL | 3 refills | Status: DC | PRN

## 2024-01-19 MED ORDER — ISOSORBIDE MONONITRATE ER 30 MG PO TB24: 30.0000 mg | ORAL_TABLET | Freq: Every day | ORAL | 3 refills | Status: DC

## 2024-01-19 MED ORDER — NITROGLYCERIN 0.4 MG SL SUBL: SUBLINGUAL_TABLET | SUBLINGUAL | 3 refills | Status: AC

## 2024-01-19 MED ORDER — ISOSORBIDE MONONITRATE ER 30 MG PO TB24: 30.0000 mg | ORAL_TABLET | Freq: Every day | ORAL | 0 refills | Status: AC

## 2024-01-19 NOTE — Telephone Encounter (Signed)
" °*  STAT* If patient is at the pharmacy, call can be transferred to refill team.   1. Which medications need to be refilled? (please list name of each medication and dose if known)   isosorbide  mononitrate (IMDUR ) 30 MG 24 hr tablet  nitroGLYCERIN  (NITROSTAT ) 0.4 MG SL tablet   2. Would you like to learn more about the convenience, safety, & potential cost savings by using the [facility]?   3. Are you open to using the [facility] (Type [facility]. ).  4. Which pharmacy/location (including street and city if local pharmacy) is medication to be sent to?  [id number] - [place], [place] - [address]   5. Do they need a 30 day or 90 day supply?   Patient stated these are new prescriptions and wants these medications sent to his local pharmacy - [id number] - [place], [place] - [address] not [facility].  "

## 2024-01-19 NOTE — Telephone Encounter (Signed)
 Called patient back about message. Patient needs a 2 week supply since this is a new start while he waits for his medications through the mail order. Informed patient that Viagra  and Imdur cannot be taken together.

## 2024-01-19 NOTE — Patient Instructions (Addendum)
 Medication Instructions:  START Isosorbide  Mononitrate (Imdur ) 30 mg. Take one (1) tablet by mouth once daily.   START Nitroglycerin  0.4mg  Take 1 as needed for emergency chest pain. Take first dose for emergency chest pain; WAIT 5 minutes and then take 2nd dose. IF still having pain if still having chest pain CALL 911 wait an additional 5 minutes before taking final dose. Do not take more than 3 doses in a day. DO NOT TAKE 72 HOURS PRIOR TO OR 72 HOURS AFTER TAKING ERECTILE DYSFUNCTION MEDIATIONS.  *If you need a refill on your cardiac medications before your next appointment, please call your pharmacy*  Lab Work: None ordered If you have labs (blood work) drawn today and your tests are completely normal, you will receive your results only by: MyChart Message (if you have MyChart) OR A paper copy in the mail If you have any lab test that is abnormal or we need to change your treatment, we will call you to review the results.  Testing/Procedures: Echocardiogram  Exercise Nuclear Stress Test  Follow-Up: At [facility], you and your health needs are our priority.  As part of our continuing mission to provide you with exceptional heart care, our providers are all part of one team.  This team includes your primary Cardiologist (physician) and Advanced Practice Providers or APPs (Physician Assistants and Nurse Practitioners) who all work together to provide you with the care you need, when you need it.  Your next appointment:   2 month(s)   Provider:   Joelle VEAR Ren Donley, MD    We recommend signing up for the patient portal called MyChart.  Sign up information is provided on this After Visit Summary.  MyChart is used to connect with patients for Virtual Visits (Telemedicine).  Patients are able to view lab/test results, encounter notes, upcoming appointments, etc.  Non-urgent messages can be sent to your provider as well.   To learn more about what you can do with MyChart, go  to [email or web address].   Other Instructions Your physician has requested that you have an echocardiogram. Echocardiography is a painless test that uses sound waves to create images of your heart. It provides your doctor with information about the size and shape of your heart and how well your hearts chambers and valves are working. This procedure takes approximately one hour. There are no restrictions for this procedure. Please do NOT wear cologne, perfume, aftershave, or lotions (deodorant is allowed). Please arrive 15 minutes prior to your appointment time.  Please note: We ask at that you not bring children with you during ultrasound (echo/ vascular) testing. Due to room size and safety concerns, children are not allowed in the ultrasound rooms during exams. Our front office staff cannot provide observation of children in our lobby area while testing is being conducted. An adult accompanying a patient to their appointment will only be allowed in the ultrasound room at the discretion of the ultrasound technician under special circumstances. We apologize for any inconvenience.   Exercise/Lexiscan Myoview (Stress Test) Instructions  Please arrive 15 minutes prior to your appointment time for registration and insurance purposes.   The test will take approximately 3 to 4 hours to complete; you may bring reading material.  If someone comes with you to your appointment, they will need to remain in the main lobby due to limited space in the testing area. **If you are pregnant or breastfeeding, please notify the nuclear lab prior to your appointment**   How to prepare  for your Myocardial Perfusion Test: Do not eat or drink 3 hours prior to your test, except you may have water. Do not consume products containing caffeine (regular or decaffeinated) 12 hours prior to your test. (ex: coffee, chocolate, sodas, tea). Do bring a list of your current medications with you.  If not listed below, you may take  your medications as normal. Do wear comfortable clothes (no dresses or overalls) and walking shoes, tennis shoes preferred (No heels or open toe shoes are allowed). Do NOT wear cologne, perfume, aftershave, or lotions (deodorant is allowed). If these instructions are not followed, your test will have to be rescheduled.   Please report to [address] [place], [place] [address] for your test.  If you have questions or concerns about your appointment, you can call the Nuclear Lab at [phone].   If you cannot keep your appointment, please provide 24 hours notification to the Nuclear Lab, to avoid a possible $50 charge to your account.

## 2024-02-06 ENCOUNTER — Other Ambulatory Visit (HOSPITAL_BASED_OUTPATIENT_CLINIC_OR_DEPARTMENT_OTHER): Payer: Self-pay

## 2024-02-06 MED ORDER — SHINGRIX 50 MCG/0.5ML IM SUSR
INTRAMUSCULAR | 0 refills | Status: DC
  Filled 2024-02-06: qty 1, 1d supply, fill #0

## 2024-02-16 ENCOUNTER — Telehealth (HOSPITAL_COMMUNITY): Payer: Self-pay | Admitting: *Deleted

## 2024-02-16 NOTE — Telephone Encounter (Signed)
 Left a detailed message on cell voicemail as a reminder about a STRESS TEST on 02/23/24 at 12:30.

## 2024-02-21 ENCOUNTER — Other Ambulatory Visit: Payer: Self-pay

## 2024-02-21 DIAGNOSIS — R0609 Other forms of dyspnea: Secondary | ICD-10-CM

## 2024-02-23 ENCOUNTER — Ambulatory Visit (HOSPITAL_COMMUNITY): Admission: RE | Admit: 2024-02-23 | Discharge: 2024-02-23 | Disposition: A | Source: Ambulatory Visit

## 2024-02-23 DIAGNOSIS — R0602 Shortness of breath: Secondary | ICD-10-CM

## 2024-02-23 DIAGNOSIS — R0609 Other forms of dyspnea: Secondary | ICD-10-CM | POA: Insufficient documentation

## 2024-02-23 LAB — MYOCARDIAL PERFUSION IMAGING
Angina Index: 2
Duke Treadmill Score: -22
Estimated workload: 7
Exercise duration (min): 6 min
Exercise duration (sec): 0 s
LV dias vol: 136 mL (ref 62–150)
LV sys vol: 91 mL
MPHR: 146 {beats}/min
Nuc Stress EF: 33 %
Peak HR: 116 {beats}/min
Percent HR: 79 %
Rest HR: 51 {beats}/min
Rest Nuclear Isotope Dose: 11 mCi
SDS: 14
SRS: 0
SSS: 14
ST Depression (mm): 4 mm
Stress Nuclear Isotope Dose: 32.8 mCi
TID: 1.39

## 2024-02-23 LAB — ECHOCARDIOGRAM COMPLETE
AR max vel: 1.67 cm2
AV Area VTI: 1.61 cm2
AV Area mean vel: 1.69 cm2
AV Mean grad: 9 mmHg
AV Peak grad: 17 mmHg
Ao pk vel: 2.06 m/s
Area-P 1/2: 2.99 cm2
S' Lateral: 2.55 cm

## 2024-02-23 MED ORDER — TECHNETIUM TC 99M TETROFOSMIN IV KIT
32.8000 | PACK | Freq: Once | INTRAVENOUS | Status: AC | PRN
  Administered 2024-02-23: 32.8 via INTRAVENOUS

## 2024-02-23 MED ORDER — TECHNETIUM TC 99M TETROFOSMIN IV KIT
11.0000 | PACK | Freq: Once | INTRAVENOUS | Status: AC | PRN
  Administered 2024-02-23: 11 via INTRAVENOUS

## 2024-02-26 ENCOUNTER — Ambulatory Visit: Payer: Self-pay

## 2024-02-26 NOTE — Telephone Encounter (Signed)
"   I called the patient to discuss the results of his ultrasound and stress test.  I explained that his ultrasound showed normal heart function with no abnormal valves.  Unfortunately, the stress test did show high risk features suggestive of multivessel disease.  I let the patient know that I will be reaching out to the scheduling team to get him in as soon as possible to discuss coronary angiogram.  Joelle DEL. Ren Ny, MD [facility]  "

## 2024-02-27 NOTE — Progress Notes (Signed)
" °   °  °  Cardiology Office Note Date:  03/01/2024  ID:  ONIE HAYASHI, DOB Aug 27, 1950, MRN [id number] PCP:  Johnny Garnette LABOR, MD  Cardiologist:  Joelle VEAR Ren Donley, MD  No chief complaint on file.     Problems CAD s/p PCI prox-distal RCA and mid LAD (2007) TTE 1/26: 60-65% NST 1/26: high risk w/ reversible ischemia and EF reduced to 33% w/ stress Sinus bradycardia M: ASA81, CL75, EE10, HTZ12.5, RN10 Visits  12/25: Reported dyspnea with exertion to PCP 12/25: Imdur  30, SLN, TTE, exercise stress test 1/26: LP, LHC     ROS: Otherwise negative Discussed the use of AI scribe software for clinical note transcription with the patient, who gave verbal consent to proceed.  History of Present Illness Jeremy [name]. is a 74 year old male with coronary artery disease who presents for evaluation following an abnormal stress test. He underwent a stress test where he experienced mild discomfort, rating it as 'two and a half or two' on a scale of ten. No chest pain or shortness of breath at home, except occasionally when running up and down stairs. He has a history of coronary artery disease with stenting performed in 2007. In 2012, he fainted during a trip to [place] , which led to an angiogram. No additional stenting was required at that time. He recalls the procedure was performed through the groin, similar to the one in 2007. He is currently on aspirin  and Plavix . His last cholesterol check was in March of the previous year, and he recalls it being satisfactory at that time.   Physical Exam VS:  BP 138/74 (BP Location: Left Arm, Patient Position: Sitting, Cuff Size: Normal)   Pulse 62   Ht 5' 10 (1.778 m)   Wt 182 lb 6.4 oz (82.7 kg)   SpO2 96%   BMI 26.17 kg/m  , BMI Body mass index is 26.17 kg/m. GEN: Well nourished, well developed, in no acute distress HEENT: normal Neck: no JVD, carotid bruits, or masses Cardiac: RRR; no murmurs, rubs, or gallops,no edema  Respiratory:  CTAB  bilaterally, normal work of breathing GI: soft, nontender, nondistended, + BS Extremities: No LE edema Skin: warm and dry, no rash Neuro:  Strength and sensation are intact  Recent Labs: Reviewed  Assessment & Plan  Coronary artery disease status post percutaneous coronary intervention Recent stress test showed decreased heart function post-exercise, indicating possible new blockage or stent issue. Differential includes new blockage or stent-related issues. Procedure via wrist access planned to minimize bleeding risk. Surgical intervention may be necessary if wire cannot pass blockage. - Scheduled angiogram with Dr. Wonda to assess for new blockage or stent issues. - Continue aspirin  and clopidogrel . - Discuss potential need for stronger antiplatelet therapy post-procedure based on findings.  Hyperlipidemia Cholesterol levels last checked in March of the previous year were satisfactory. - Ordered cholesterol check.  Informed Consent   Shared Decision Making/Informed Consent The risks [stroke (1 in 1000), death (1 in 1000), kidney failure [usually temporary] (1 in 500), bleeding (1 in 200), allergic reaction [possibly serious] (1 in 200)], benefits (diagnostic support and management of coronary artery disease) and alternatives of a cardiac catheterization were discussed in detail with Mr. Mcclaine and he is willing to proceed.      Signed, Joelle VEAR Ren Donley, MD  03/01/2024 9:57 AM    [facility] "

## 2024-03-01 ENCOUNTER — Ambulatory Visit

## 2024-03-01 VITALS — BP 138/74 | HR 62 | Ht 70.0 in | Wt 182.4 lb

## 2024-03-01 DIAGNOSIS — R0609 Other forms of dyspnea: Secondary | ICD-10-CM | POA: Diagnosis not present

## 2024-03-01 DIAGNOSIS — E785 Hyperlipidemia, unspecified: Secondary | ICD-10-CM

## 2024-03-01 DIAGNOSIS — I2510 Atherosclerotic heart disease of native coronary artery without angina pectoris: Secondary | ICD-10-CM

## 2024-03-01 DIAGNOSIS — I10 Essential (primary) hypertension: Secondary | ICD-10-CM | POA: Diagnosis not present

## 2024-03-01 LAB — COMPREHENSIVE METABOLIC PANEL WITH GFR
ALT: 23 [IU]/L (ref 0–44)
AST: 27 [IU]/L (ref 0–40)
Albumin: 4.5 g/dL (ref 3.8–4.8)
Alkaline Phosphatase: 83 [IU]/L (ref 47–123)
BUN/Creatinine Ratio: 18 (ref 10–24)
BUN: 17 mg/dL (ref 8–27)
Bilirubin Total: 0.8 mg/dL (ref 0.0–1.2)
CO2: 25 mmol/L (ref 20–29)
Calcium: 9.1 mg/dL (ref 8.6–10.2)
Chloride: 98 mmol/L (ref 96–106)
Creatinine, Ser: 0.94 mg/dL (ref 0.76–1.27)
Globulin, Total: 2.4 g/dL (ref 1.5–4.5)
Glucose: 69 mg/dL — ABNORMAL LOW (ref 70–99)
Potassium: 4.7 mmol/L (ref 3.5–5.2)
Sodium: 138 mmol/L (ref 134–144)
Total Protein: 6.9 g/dL (ref 6.0–8.5)
eGFR: 85 mL/min/{1.73_m2}

## 2024-03-01 LAB — LIPID PANEL
Chol/HDL Ratio: 2.7 ratio (ref 0.0–5.0)
Cholesterol, Total: 112 mg/dL (ref 100–199)
HDL: 42 mg/dL
LDL Chol Calc (NIH): 55 mg/dL (ref 0–99)
Triglycerides: 69 mg/dL (ref 0–149)
VLDL Cholesterol Cal: 15 mg/dL (ref 5–40)

## 2024-03-01 LAB — CBC
Hematocrit: 44.7 % (ref 37.5–51.0)
Hemoglobin: 14.8 g/dL (ref 13.0–17.7)
MCH: 31 pg (ref 26.6–33.0)
MCHC: 33.1 g/dL (ref 31.5–35.7)
MCV: 94 fL (ref 79–97)
Platelets: 245 10*3/uL (ref 150–450)
RBC: 4.77 x10E6/uL (ref 4.14–5.80)
RDW: 13.1 % (ref 11.6–15.4)
WBC: 6.5 10*3/uL (ref 3.4–10.8)

## 2024-03-01 NOTE — Patient Instructions (Signed)
 Lab Work: PLEASE GET LIPIDS, CBC, CMP DRAWN TODAY. If you have labs (blood work) drawn today and your tests are completely normal, you will receive your results only by: MyChart Message (if you have MyChart) OR A paper copy in the mail If you have any lab test that is abnormal or we need to change your treatment, we will call you to review the results.  Testing/Procedures: Your physician has requested that you have a cardiac catheterization. Cardiac catheterization is used to diagnose and/or treat various heart conditions. Doctors may recommend this procedure for a number of different reasons. The most common reason is to evaluate chest pain. Chest pain can be a symptom of coronary artery disease (CAD), and cardiac catheterization can show whether plaque is narrowing or blocking your hearts arteries. This procedure is also used to evaluate the valves, as well as measure the blood flow and oxygen levels in different parts of your heart. For further information please visit [email or web address] Please follow instruction sheet, as given.   Follow-Up: At [facility], you and your health needs are our priority.  As part of our continuing mission to provide you with exceptional heart care, our providers are all part of one team.  This team includes your primary Cardiologist (physician) and Advanced Practice Providers or APPs (Physician Assistants and Nurse Practitioners) who all work together to provide you with the care you need, when you need it.  Your next appointment:   3 month(s)  Provider:   Joelle VEAR Ren Donley, MD    Other Instructions   HEARTCARE A DEPT OF [facility]CARE AT [address] A DEPT OF THE [place] [address] [place] [address] Dept: [phone] Loc: [phone]  TABER SWEETSER  03/01/2024  You are scheduled for a Cardiac Angiogram on Wednesday, February 4 with Dr. Newman Lawrence.  1. Please arrive  at the [facility] (Main Entrance A) at [facility]: [address] [place], [place] [address] at 11:00 AM (This time is 2 hour(s) before your procedure to ensure your preparation).   Free valet parking service is available. You will check in at ADMITTING. The support person will be asked to wait in the waiting room.  It is OK to have someone drop you off and come back when you are ready to be discharged.    Special note: Every effort is made to have your procedure done on time. Please understand that emergencies sometimes delay scheduled procedures.  2. Diet: Light meals may be eaten up to 6 hours before scheduled procedures from 12N and after; please stop eating at 6:00 AM  Light meal consist of plain toast, fruit, light soups, crackers.   3. Hydration: You need to be well hydrated before your procedure. On February 4, you may drink approved liquids (see below) until 2 hours before the procedure, with 16 oz of water  as your last intake.   List of approved liquids water , clear juice, clear tea, black coffee, fruit juices, non-citric and without pulp, carbonated beverages, Gatorade, Kool -Aid, plain Jello-O and plain ice popsicles.  4. Labs: You will need to have blood drawn on Friday, January 30 at [facility] (1st Floor), [address], [place], [place] [address]. You do not need to be fasting.  5. Medication instructions in preparation for your procedure:   Contrast Allergy: No   PLEASE HOLD hydrochlorothiazide  for 24 hours.  On the morning of your procedure, take your Aspirin  81 mg and Plavix /Clopidogrel  and any morning medicines NOT listed above.  You may use sips of water .  6. Plan to go home the same day, you will only stay overnight if medically necessary. 7. Bring a current list of your medications and current insurance cards. 8. You MUST have a responsible person to drive you home. 9. Someone MUST be with you the first 24  hours after you arrive home or your discharge will be delayed. 10. Please wear clothes that are easy to get on and off and wear slip-on shoes.  Thank you for allowing us  to care for you!   -- [facility]

## 2024-03-04 ENCOUNTER — Telehealth: Payer: Self-pay

## 2024-03-04 NOTE — Telephone Encounter (Signed)
 Patient called concerned about possibly not being able to attend his cath appointment on Wednesday due to weather. He would like to keep the appointment but asked if he wakes up and the roads are unsafe, whether he can reschedule and if there would be a penalty or delay.  Informed patient that there is no penalty for inclement weather as we want him to be safe. Advised him to call Wednesday morning to reschedule if he cannot travel safely. Patient verbalizes understanding.

## 2024-03-04 NOTE — Telephone Encounter (Signed)
 Patient needs to r/s his surgery for 2/4. Please advise

## 2024-03-06 ENCOUNTER — Inpatient Hospital Stay (HOSPITAL_COMMUNITY): Admission: RE | Admit: 2024-03-06 | Source: Home / Self Care | Admitting: Cardiology

## 2024-03-06 ENCOUNTER — Other Ambulatory Visit: Payer: Self-pay

## 2024-03-06 ENCOUNTER — Other Ambulatory Visit (HOSPITAL_COMMUNITY): Payer: Self-pay

## 2024-03-06 ENCOUNTER — Ambulatory Visit (HOSPITAL_COMMUNITY)

## 2024-03-06 ENCOUNTER — Encounter (HOSPITAL_COMMUNITY): Admission: RE | Payer: Self-pay | Source: Home / Self Care

## 2024-03-06 DIAGNOSIS — I2510 Atherosclerotic heart disease of native coronary artery without angina pectoris: Principal | ICD-10-CM | POA: Diagnosis present

## 2024-03-06 DIAGNOSIS — Z01810 Encounter for preprocedural cardiovascular examination: Secondary | ICD-10-CM

## 2024-03-06 MED ORDER — ONDANSETRON HCL 4 MG/2ML IJ SOLN: 4.0000 mg | Freq: Four times a day (QID) | INTRAMUSCULAR | Status: AC | PRN

## 2024-03-06 MED ORDER — ROSUVASTATIN CALCIUM 10 MG PO TABS: 20.0000 mg | ORAL_TABLET | Freq: Every day | ORAL | Status: AC

## 2024-03-06 MED ORDER — LIDOCAINE HCL (PF) 1 % IJ SOLN
INTRAMUSCULAR | Status: AC
  Filled 2024-03-06: qty 30

## 2024-03-06 MED ORDER — MIDAZOLAM HCL (PF) 2 MG/2ML IJ SOLN
INTRAMUSCULAR | Status: DC | PRN
  Administered 2024-03-06: 1 mg via INTRAVENOUS

## 2024-03-06 MED ORDER — FENTANYL CITRATE (PF) 100 MCG/2ML IJ SOLN
INTRAMUSCULAR | Status: AC
  Filled 2024-03-06: qty 2

## 2024-03-06 MED ORDER — SODIUM CHLORIDE 0.9 % IV SOLN: 250.0000 mL | INTRAVENOUS | Status: AC | PRN

## 2024-03-06 MED ORDER — ACETAMINOPHEN 325 MG PO TABS: 650.0000 mg | ORAL_TABLET | ORAL | Status: AC | PRN

## 2024-03-06 MED ORDER — SODIUM CHLORIDE 0.9% FLUSH
3.0000 mL | Freq: Two times a day (BID) | INTRAVENOUS | Status: AC
  Administered 2024-03-07 – 2024-03-08 (×5): 3 mL via INTRAVENOUS

## 2024-03-06 MED ORDER — FENTANYL CITRATE (PF) 100 MCG/2ML IJ SOLN
INTRAMUSCULAR | Status: DC | PRN
  Administered 2024-03-06: 25 ug via INTRAVENOUS

## 2024-03-06 MED ORDER — FREE WATER: 500.0000 mL | Freq: Once | Status: DC

## 2024-03-06 MED ORDER — FUROSEMIDE 40 MG PO TABS
40.0000 mg | ORAL_TABLET | Freq: Every day | ORAL | 0 refills | Status: AC
  Filled 2024-03-06: qty 30, 30d supply, fill #0

## 2024-03-06 MED ORDER — ASPIRIN 81 MG PO CHEW
CHEWABLE_TABLET | ORAL | Status: AC
  Filled 2024-03-06: qty 1

## 2024-03-06 MED ORDER — SODIUM CHLORIDE 0.9 % IV SOLN: 250.0000 mL | INTRAVENOUS | Status: DC | PRN

## 2024-03-06 MED ORDER — HEPARIN SODIUM (PORCINE) 1000 UNIT/ML IJ SOLN
INTRAMUSCULAR | Status: DC | PRN
  Administered 2024-03-06: 4500 [IU] via INTRAVENOUS

## 2024-03-06 MED ORDER — VERAPAMIL HCL 2.5 MG/ML IV SOLN
INTRAVENOUS | Status: DC | PRN
  Administered 2024-03-06: 10 mL via INTRA_ARTERIAL

## 2024-03-06 MED ORDER — FREE WATER: 250.0000 mL | Freq: Once | Status: AC

## 2024-03-06 MED ORDER — VERAPAMIL HCL 2.5 MG/ML IV SOLN
INTRAVENOUS | Status: AC
  Filled 2024-03-06: qty 2

## 2024-03-06 MED ORDER — MIDAZOLAM HCL 2 MG/2ML IJ SOLN
INTRAMUSCULAR | Status: AC
  Filled 2024-03-06: qty 2

## 2024-03-06 MED ORDER — ASPIRIN 81 MG PO CHEW: 81.0000 mg | CHEWABLE_TABLET | ORAL | Status: DC

## 2024-03-06 MED ORDER — IOHEXOL 350 MG/ML SOLN
INTRAVENOUS | Status: DC | PRN
  Administered 2024-03-06: 30 mL

## 2024-03-06 MED ORDER — LIDOCAINE HCL (PF) 1 % IJ SOLN
INTRAMUSCULAR | Status: DC | PRN
  Administered 2024-03-06: 2 mL via INTRADERMAL

## 2024-03-06 MED ORDER — HEPARIN SODIUM (PORCINE) 1000 UNIT/ML IJ SOLN
INTRAMUSCULAR | Status: AC
  Filled 2024-03-06: qty 10

## 2024-03-06 MED ORDER — LABETALOL HCL 5 MG/ML IV SOLN: 10.0000 mg | INTRAVENOUS | Status: AC | PRN

## 2024-03-06 MED ORDER — SODIUM CHLORIDE 0.9% FLUSH: 3.0000 mL | INTRAVENOUS | Status: AC | PRN

## 2024-03-06 MED ORDER — HEPARIN (PORCINE) IN NACL 1000-0.9 UT/500ML-% IV SOLN
INTRAVENOUS | Status: DC | PRN
  Administered 2024-03-06 (×2): 500 mL

## 2024-03-06 MED ORDER — SODIUM CHLORIDE 0.9% FLUSH: 3.0000 mL | INTRAVENOUS | Status: DC | PRN

## 2024-03-06 MED ORDER — HYDRALAZINE HCL 20 MG/ML IJ SOLN: 10.0000 mg | INTRAMUSCULAR | Status: AC | PRN

## 2024-03-06 MED ORDER — SODIUM CHLORIDE 0.9% FLUSH: 3.0000 mL | Freq: Two times a day (BID) | INTRAVENOUS | Status: DC

## 2024-03-06 NOTE — Progress Notes (Signed)
 Pre-CABG doppler has been completed.  Results can be found in chart review under CV Proc.  03/06/2024 6:57 PM  Jeury Mcnab [employer], RVT.

## 2024-03-06 NOTE — Consult Note (Addendum)
 "     [address]       [place] [address]             [phone]        [name] [facility] Medical Record #[id number] Date of Birth: 1951-01-16  Referring: Dr. Elmira Primary Care: Jeremy [name] LABOR, MD Primary Cardiologist:Jeremy H Ren Ny, MD  Chief Complaint:   Consultation for CABG surgery candidacy   History of Present Illness:     Jeremy [name] is a 74 year old male with a past medical history of coronary artery disease, hypertension and a remote 2-3 pack smoker but quit sometime in the 90s. He has a history of PCI with DES to the LAD and 2 DES to the proximal and mid RCA in 2007, he also underwent cardiac catheterization following a syncopal episode in [place]  in 2012 but did not require additional stenting at the time. He remains on ASA and Plavix . He was found to have an abnormal stress test on 1/25 with results consistent with ischemia. He was brought to [name] for outpatient cardiac catheterization today (2/04) and was found to have 95% distal stenosis, 95% ostial stenosis, 60 and 70% mid focal stenosis, and proximal circumflex with 60% stenosis. Proximal LAD stent shows no restenosis, proximal RCA stent shows 30% late lumen loss and mid-distal RCA stent shows 10% late lumen loss with right to left collaterals from RPLA to LAD/septal. Echocardiogram 1/23 showed LVEF 60-65%, trivial mitral valve regurgitation and mild aortic valve stenosis.   The patient reports he has had significant shortness of breath and mild chest tightness with walks, especially during 3 mile hikes. At times he has had to stop and have his wife pick him up in the car. Chest tightness occurs intermittently but improves with a few minutes of rest, he has never used nitroglycerin . He denies palpitations, radiation of chest pain, diaphoresis, nausea, vomiting, and syncope. He denies current chest pain. Last dose of Plavix  was this morning 2/04. He lives in a 3 story home  with his wife and they are both active individuals.   Current Activity/ Functional Status: Patient is independent with mobility/ambulation, transfers, ADL's, IADL's.   Zubrod Score: At the time of surgery this patients most appropriate activity status/level should be described as: []     0    Normal activity, no symptoms [x]     1    Restricted in physical strenuous activity but ambulatory, able to do out light work []     2    Ambulatory and capable of self care, unable to do work activities, up and about                 more than 50%  Of the time                            []     3    Only limited self care, in bed greater than 50% of waking hours []     4    Completely disabled, no self care, confined to bed or chair []     5    Moribund  Past Medical History:  Diagnosis Date   Colon polyp 07/05/2007   TUBULAR ADENOMA   Coronary artery disease    Cardiac catheterization 8/07: Proximal LAD 50-75%, mid LAD 60%, ostial septal perforator 80%, proximal RCA 50% and then 80-85%, distal RCA 80-90%, EF 55-60%.  PCI was  performed at that time colon distal RCA treated with a Taxus drug-eluting stent.  The proximal RCA treated with a Taxus drug eluting stent.  The LAD was studied with IVUS and treated with a Taxus drug eluding stent.     Hyperlipidemia    Ill-defined closed fractures of upper limb    Sinus bradycardia    sees Dr. Fernande    Syncope     Past Surgical History:  Procedure Laterality Date   APPENDECTOMY     COLONOSCOPY  06-20-13   per Dr. Avram, clear, repeat in 10 yrs    CORONARY ANGIOPLASTY      Tobacco Use History[1]  Social History   Substance and Sexual Activity  Alcohol Use Yes   Alcohol/week: 0.0 standard drinks of alcohol   Comment: occ     Allergies[2]  Current Facility-Administered Medications  Medication Dose Route Frequency Provider Last Rate Last Admin   0.9 %  sodium chloride  infusion  250 mL Intravenous PRN Jeremy [name], Jeremy DEL, MD       0.9 %  sodium  chloride infusion  250 mL Intravenous PRN Jeremy [name], Jeremy J, MD       acetaminophen  (TYLENOL ) tablet 650 mg  650 mg Oral Q4H PRN Jeremy [name], Jeremy J, MD       aspirin  chewable tablet 81 mg  81 mg Oral Pre-Cath Jeremy [name], Jeremy DEL, MD       fentaNYL  (SUBLIMAZE ) injection    PRN Jeremy [name] Jeremy PARAS, MD   25 mcg at 03/06/24 1411   free water  250 mL  250 mL Oral Once Jeremy [name], Jeremy J, MD       free water  500 mL  500 mL Oral Once Jeremy [name], Jeremy DEL, MD       Heparin  (Porcine) in NaCl 1000-0.9 UT/500ML-% SOLN    PRN Jeremy [name], Jeremy PARAS, MD   500 mL at 03/06/24 1357   heparin  sodium (porcine) injection    PRN Jeremy [name] Jeremy PARAS, MD   4,500 Units at 03/06/24 1412   hydrALAZINE  (APRESOLINE ) injection 10 mg  10 mg Intravenous Q20 Min PRN Jeremy [name], Jeremy J, MD       iohexol  (OMNIPAQUE ) 350 MG/ML injection    PRN Jeremy [name], Jeremy J, MD   30 mL at 03/06/24 1422   labetalol  (NORMODYNE ) injection 10 mg  10 mg Intravenous Q10 min PRN Jeremy [name], Jeremy J, MD       lidocaine  (PF) (XYLOCAINE ) 1 % injection    PRN Jeremy [name], Jeremy J, MD   2 mL at 03/06/24 1411   midazolam  PF (VERSED ) injection    PRN Jeremy [name], Jeremy PARAS, MD   1 mg at 03/06/24 1410   ondansetron  (ZOFRAN ) injection 4 mg  4 mg Intravenous Q6H PRN Jeremy [name], Jeremy J, MD       Radial Cocktail/Verapamil  only    PRN Jeremy [name], Jeremy J, MD   10 mL at 03/06/24 1411   sodium chloride  flush (NS) 0.9 % injection 3 mL  3 mL Intravenous Q12H Jeremy [name], Jeremy DEL, MD       sodium chloride  flush (NS) 0.9 % injection 3 mL  3 mL Intravenous PRN Jeremy [name], Jeremy DEL, MD       sodium chloride  flush (NS) 0.9 % injection 3 mL  3 mL Intravenous Q12H Jeremy [name], Jeremy J, MD       sodium chloride  flush (NS) 0.9 % injection 3 mL  3 mL Intravenous PRN Jeremy [name], Jeremy PARAS, MD        Medications Prior  to Admission  Medication Sig Dispense Refill Last Dose/Taking   acetaminophen  (TYLENOL ) 500 MG tablet Take 500-1,000 mg by mouth every  6 (six) hours as needed (pain.).   Past Month   aspirin  EC 81 MG tablet Take 81 mg by mouth at bedtime.   03/06/2024 at  7:00 AM   BIOTIN PO Take 1 tablet by mouth every evening.   Taking   clopidogrel  (PLAVIX ) 75 MG tablet TAKE 1 TABLET DAILY (Patient taking differently: Take 75 mg by mouth at bedtime.) 90 tablet 3 03/06/2024 at  7:00 AM   ezetimibe  (ZETIA ) 10 MG tablet TAKE 1 TABLET DAILY 90 tablet 1 03/05/2024   fluticasone  (FLONASE ) 50 MCG/ACT nasal spray Place 2 sprays into the nose as directed. (Patient taking differently: Place 2 sprays into the nose in the morning.) 48 g 3 03/06/2024   hydrochlorothiazide  (MICROZIDE ) 12.5 MG capsule TAKE 1 CAPSULE DAILY 90 capsule 3 03/05/2024   isosorbide  mononitrate (IMDUR ) 30 MG 24 hr tablet Take 1 tablet (30 mg total) by mouth daily. (Patient taking differently: Take 30 mg by mouth at bedtime.) 14 tablet 0 03/05/2024   nitroGLYCERIN  (NITROSTAT ) 0.4 MG SL tablet Take 1 tab, under your tongue, while sitting for chest pain.  If no relief of pain may repeat one tab every 5 minutes up to 3 tablets total over 15 minutes. If no relief CALL 911. 25 tablet 3 Taking   sildenafil  (VIAGRA ) 100 MG tablet Take 1 tablet (100 mg total) by mouth as needed for erectile dysfunction. 10 tablet 5 03/05/2024   [DISCONTINUED] rosuvastatin  (CRESTOR ) 10 MG tablet Take 1 tablet (10 mg total) by mouth daily. (Patient taking differently: Take 10 mg by mouth at bedtime.) 90 tablet 1 03/05/2024    Family History  Problem Relation Age of Onset   Prostate cancer Father    Cancer Father        prostate cancer w/ mets   Heart disease Father        CAD   Hyperlipidemia Father    Stroke Mother    Colon cancer Maternal Uncle 60   Heart attack Paternal Grandmother    COPD Neg Hx    Diabetes Neg Hx      Review of Systems:   Review of Systems  Constitutional:  Negative for diaphoresis, malaise/fatigue and weight loss.  HENT:  Negative for hearing loss.   Eyes:  Negative for blurred vision.   Respiratory:  Positive for shortness of breath. Negative for cough and wheezing.   Cardiovascular:  Negative for palpitations, orthopnea and leg swelling.       Chest tightness  Gastrointestinal:  Negative for constipation, diarrhea, nausea and vomiting.  Genitourinary:  Negative for dysuria.  Musculoskeletal:  Negative for falls.  Neurological:  Negative for dizziness, loss of consciousness and weakness.  Endo/Heme/Allergies:  Bruises/bleeds easily.  Psychiatric/Behavioral:  Negative for depression. The patient is not nervous/anxious.    Physical Exam: BP (!) 119/59   Pulse (!) 51   Temp 98.5 F (36.9 C)   Resp 18   Ht 5' 10 (1.778 m)   Wt 81.6 kg   SpO2 99%   BMI 25.83 kg/m   General appearance: alert, cooperative, and no distress Head: Normocephalic, without obvious abnormality, atraumatic Neck: no adenopathy, no carotid bruit, no JVD, supple, symmetrical, trachea midline, and thyroid  not enlarged, symmetric, no tenderness/mass/nodules Lymph nodes: Cervical, supraclavicular, and axillary nodes normal. Resp: slight wheezes at the bases Cardio: regular rate and rhythm, S1, S2 normal, no murmur, click,  rub or gallop GI: soft, non-tender; bowel sounds normal; no masses,  no organomegaly Extremities: edema trace BLE, 2+ DP/PT pulses bilaterally, no varicose or spider veins Neurologic: Grossly normal      Recent Radiology Findings:   CARDIAC CATHETERIZATION Result Date: 03/06/2024 Images from the original result were not included. Coronary angiography 03/06/2024: LM: Distal 95% stenosis LAD: Ostial 95%, mid focal 70% and 60% stenoses          Prox LAD stent with no restenosis/late lumen loss Lcx: Proximal 60% stenosis RCA: Dominant vessel           Prox stent with 30% late lumen loss           Mid-distal stent with 10% late lumen loss           Right-to left collaterals from RPLA to LAD/septal LVEDP 29 mmHg Conclusion: Severe left main stenosis Rest mild to moderate coronary artery  disease Normal rest LVEF (Echocardiogram 02/2024) Recommendation: Urgent outpatient CVTS consultation for two vessel CABG Will add lasix  40 mg daily for optimization of filling pressures Jeremy JINNY Lawrence, MD   ECHOCARDIOGRAM REPORT       Patient Name:   Jeremy [name] Date of Exam: 02/23/2024  Medical Rec #:  [id number]     Height:       70.1 in  Accession #:    [id number]    Weight:       182.5 lb  Date of Birth:  May 27, 1950     BSA:          2.009 m  Patient Age:    73 years      BP:           123/50 mmHg  Patient Gender: M             HR:           54 bpm.  Exam Location:  Outpatient   Procedure: 2D Echo, 3D Echo, Cardiac Doppler, Color Doppler and Strain  Analysis            (Both Spectral and Color Flow Doppler were utilized during             procedure).   Indications:    Shortness of breath    History:        Patient has prior history of Echocardiogram examinations,  most                 recent 10/04/2010. CAD, Arrythmias:Bradycardia,                  Signs/Symptoms:Syncope and Shortness of Breath; Risk                  Factors:Dyslipidemia, Former Smoker and Hypertension.    Sonographer:    Orvil Holmes RDCS  Referring Phys: [id number] [name]   IMPRESSIONS     1. Left ventricular ejection fraction, by estimation, is 60 to 65%. Left  ventricular ejection fraction by 3D volume is 67 %. The left ventricle has  normal function. The left ventricle has no regional wall motion  abnormalities. Left ventricular diastolic   parameters were normal. The average left ventricular global longitudinal  strain is -20.2 %. The global longitudinal strain is normal.   2. Right ventricular systolic function is normal. The right ventricular  size is normal. Tricuspid regurgitation signal is inadequate for assessing  PA pressure.   3. The mitral valve is normal in structure. Trivial mitral valve  regurgitation. No evidence of mitral stenosis.   4. The aortic valve is  tricuspid. There is mild calcification of the  aortic valve. Aortic valve regurgitation is not visualized. Mild aortic  valve stenosis. Aortic valve mean gradient measures 9.0 mmHg. Aortic valve  Vmax measures 2.06 m/s.   5. The inferior vena cava is normal in size with greater than 50%  respiratory variability, suggesting right atrial pressure of 3 mmHg.   FINDINGS   Left Ventricle: Left ventricular ejection fraction, by estimation, is 60  to 65%. Left ventricular ejection fraction by 3D volume is 67 %. The left  ventricle has normal function. The left ventricle has no regional wall  motion abnormalities. The average  left ventricular global longitudinal strain is -20.2 %. Strain was  performed and the global longitudinal strain is normal. The left  ventricular internal cavity size was normal in size. There is no left  ventricular hypertrophy. Left ventricular diastolic  parameters were normal.   Right Ventricle: The right ventricular size is normal. No increase in  right ventricular wall thickness. Right ventricular systolic function is  normal. Tricuspid regurgitation signal is inadequate for assessing PA  pressure.   Left Atrium: Left atrial size was normal in size.   Right Atrium: Right atrial size was normal in size.   Pericardium: There is no evidence of pericardial effusion.   Mitral Valve: The mitral valve is normal in structure. Trivial mitral  valve regurgitation. No evidence of mitral valve stenosis.   Tricuspid Valve: The tricuspid valve is normal in structure. Tricuspid  valve regurgitation is mild . No evidence of tricuspid stenosis.   Aortic Valve: The aortic valve is tricuspid. There is mild calcification  of the aortic valve. Aortic valve regurgitation is not visualized. Mild  aortic stenosis is present. Aortic valve mean gradient measures 9.0 mmHg.  Aortic valve peak gradient measures   17.0 mmHg. Aortic valve area, by VTI measures 1.61 cm.   Pulmonic  Valve: The pulmonic valve was normal in structure. Pulmonic valve  regurgitation is not visualized. No evidence of pulmonic stenosis.   Aorta: The aortic root and ascending aorta are structurally normal, with  no evidence of dilitation.   Venous: The inferior vena cava is normal in size with greater than 50%  respiratory variability, suggesting right atrial pressure of 3 mmHg.   IAS/Shunts: No atrial level shunt detected by color flow Doppler.   Additional Comments: 3D was performed not requiring image post processing  on an independent workstation and was normal.     LEFT VENTRICLE  PLAX 2D  LVIDd:         4.82 cm         Diastology  LVIDs:         2.54 cm         LV e' medial:    8.16 cm/s  LV PW:         0.78 cm         LV E/e' medial:  9.3  LV IVS:        0.83 cm         LV e' lateral:   12.60 cm/s  LVOT diam:     2.06 cm         LV E/e' lateral: 6.0  LV SV:         81  LV SV Index:   40              2D Longitudinal  LVOT  Area:     3.33 cm        Strain                                 2D Strain GLS   -20.3 %                                 (A4C):                                 2D Strain GLS   -19.4 %                                 (A3C):                                 2D Strain GLS   -20.9 %                                 (A2C):                                 2D Strain GLS   -20.2 %                                 Avg:                                   3D Volume EF                                 LV 3D EF:    Left                                              ventricul                                              ar                                              ejection                                              fraction  by 3D                                              volume is                                              67 %.                                   3D Volume EF:                                 3D EF:         67 %                                 LV EDV:       150 ml                                 LV ESV:       50 ml                                 LV SV:        101 ml   RIGHT VENTRICLE  RV Basal diam:  3.88 cm     PULMONARY VEINS  RV Mid diam:    2.68 cm     A Reversal Velocity: 28.20 cm/s  RV S prime:     14.60 cm/s  Diastolic Velocity:  53.30 cm/s  TAPSE (M-mode): 2.9 cm      S/D Velocity:        1.00                              Systolic Velocity:   51.10 cm/s   LEFT ATRIUM             Index        RIGHT ATRIUM           Index  LA diam:        3.58 cm 1.78 cm/m   RA Area:     18.10 cm  LA Vol (A2C):   37.5 ml 18.66 ml/m  RA Volume:   52.00 ml  25.88 ml/m  LA Vol (A4C):   30.2 ml 15.03 ml/m  LA Biplane Vol: 33.8 ml 16.82 ml/m   AORTIC VALVE  AV Area (Vmax):    1.67 cm  AV Area (Vmean):   1.69 cm  AV Area (VTI):     1.61 cm  AV Vmax:           206.00 cm/s  AV Vmean:          133.000 cm/s  AV VTI:            0.506 m  AV Peak Grad:      17.0 mmHg  AV Mean Grad:  9.0 mmHg  LVOT Vmax:         103.00 cm/s  LVOT Vmean:        67.600 cm/s  LVOT VTI:          0.244 m  LVOT/AV VTI ratio: 0.48    AORTA  Ao Root diam: 3.04 cm  Ao Asc diam:  3.61 cm   MITRAL VALVE  MV Area (PHT): 2.99 cm    SHUNTS  MV Decel Time: 254 msec    Systemic VTI:  0.24 m  MV E velocity: 75.50 cm/s  Systemic Diam: 2.06 cm  MV A velocity: 67.60 cm/s  MV E/A ratio:  1.12   Mihai Croitoru MD  Electronically signed by Jerel Balding MD  Signature Date/Time: 02/23/2024/4:04:25 PM        Final      Recent Lab Findings: Lab Results  Component Value Date   WBC 6.5 03/01/2024   HGB 14.8 03/01/2024   HCT 44.7 03/01/2024   PLT 245 03/01/2024   GLUCOSE 69 (L) 03/01/2024   CHOL 112 03/01/2024   TRIG 69 03/01/2024   HDL 42 03/01/2024   LDLDIRECT 79.0 07/17/2014   LDLCALC 55 03/01/2024   ALT 23 03/01/2024   AST 27 03/01/2024   NA 138 03/01/2024   K 4.7 03/01/2024   CL 98 03/01/2024    CREATININE 0.94 03/01/2024   BUN 17 03/01/2024   CO2 25 03/01/2024   TSH 1.64 01/12/2024   INR 1.1 (H) 10/06/2010   HGBA1C 6.2 04/06/2023   Assessment / Plan:   Coronary artery disease: Severe left main stenosis found on cardiac catheterization. He has had previous stents placed in 2007 on ASA and Plavix , last dose of plavix  was this morning 2/04. He seems to be a good candidate for CABG surgery. He will need 5 day plavix  washout and our first OR availability is likely 02/11. Per cardiology they plan to discharge home and he will come back for outpatient CABG. Will leave that decision up to cardiology. But may need to stay in hospital due to 95% left main. Surgeon to ultimately determine CABG candidacy and timing.   Care per primary team: Hypertension  I  spent 25 minutes counseling the patient face to face.  Con GORMAN Bend, PA-C 03/06/2024 3:34 PM  Mr. Lopata is a pleasant 74 year old man who presented for outpatient LHC and was found to have severe LM disease.  He has a history of CAD s/p PCI with DES to the LAD and DES to prox, mid RCA in 2007.  He is on Plavix  and took it this morning.  He has been having shortness of breath and angina with exertion (mostly walking and hiking).  This prompted a stress test which showed ischemia, so he underwent LHC cath.  He has never had chest surgery and denies chest pain  currently.  He lives in a house with his wife and is completely independent in his ADLs. His most recent ECHO showed normal biventricular function and no significant valvular disease.  Given the severity of his coronary disease, I recommended admission for Plavix  washout and heparin  gtt.  He and his wife are amenable to this.  He is otherwise a good candidate for surgery.  I discussed the general nature of the procedure, including the need for general anesthesia, the incisions to be used, the use of cardiopulmonary bypass, and the use of temporary pacemaker wires and drainage tubes  postoperatively with him and his wife.  We discussed  the expected hospital stay, overall recovery and short and long term outcomes. I informed them of the indications, risks, benefits and alternatives.   He understands the risks include, but are not limited to death, stroke, MI, DVT/PE, bleeding, possible need for transfusion, infections, cardiac arrhythmias, as well as other organ system dysfunction including respiratory (eg: prolonged ventilation), renal, or GI complications.   Tentatively planning for surgery on 03/13/24 which is the next OR availability.  In the meantime, I encouraged him to eat a high protein diet and to walk frequently in the hallways while awaiting surgery.  Con Clunes, MD Cardiothoracic Surgery Pager: [phone]     [1]  Social History Tobacco Use  Smoking Status Former  Smokeless Tobacco Never  [2] No Known Allergies  "

## 2024-03-06 NOTE — Progress Notes (Signed)
 Pt transferred to [facility], report called to RN, radial site is clean dry intact, site is soft, no signs of bleeding.

## 2024-03-06 NOTE — H&P (Signed)
 OV 03/01/2024 copied for documentation        Cardiology Office Note Date:  03/06/2024  ID:  MARQUARIUS LOFTON, DOB November 13, 1950, MRN [id number] PCP:  Johnny Garnette LABOR, MD  Cardiologist:  Newman JINNY Lawrence, MD  No chief complaint on file.     Problems CAD s/p PCI prox-distal RCA and mid LAD (2007) TTE 1/26: 60-65% NST 1/26: high risk w/ reversible ischemia and EF reduced to 33% w/ stress Sinus bradycardia M: ASA81, CL75, EE10, HTZ12.5, RN10 Visits  12/25: Reported dyspnea with exertion to PCP 12/25: Imdur  30, SLN, TTE, exercise stress test 1/26: LP, LHC     ROS: Otherwise negative Discussed the use of AI scribe software for clinical note transcription with the patient, who gave verbal consent to proceed.  History of Present Illness Jeremy [name]. is a 74 year old male with coronary artery disease who presents for evaluation following an abnormal stress test. He underwent a stress test where he experienced mild discomfort, rating it as 'two and a half or two' on a scale of ten. No chest pain or shortness of breath at home, except occasionally when running up and down stairs. He has a history of coronary artery disease with stenting performed in 2007. In 2012, he fainted during a trip to [place] , which led to an angiogram. No additional stenting was required at that time. He recalls the procedure was performed through the groin, similar to the one in 2007. He is currently on aspirin  and Plavix . His last cholesterol check was in March of the previous year, and he recalls it being satisfactory at that time.   Physical Exam VS:  BP (!) 149/87   Pulse (!) 53   Temp 98.5 F (36.9 C)   Ht 5' 10 (1.778 m)   Wt 81.6 kg   SpO2 96%   BMI 25.83 kg/m  , BMI Body mass index is 25.83 kg/m. GEN: Well nourished, well developed, in no acute distress HEENT: normal Neck: no JVD, carotid bruits, or masses Cardiac: RRR; no murmurs, rubs, or gallops,no edema  Respiratory:  CTAB bilaterally,  normal work of breathing GI: soft, nontender, nondistended, + BS Extremities: No LE edema Skin: warm and dry, no rash Neuro:  Strength and sensation are intact  Recent Labs: Reviewed  Assessment & Plan  Coronary artery disease status post percutaneous coronary intervention Recent stress test showed decreased heart function post-exercise, indicating possible new blockage or stent issue. Differential includes new blockage or stent-related issues. Procedure via wrist access planned to minimize bleeding risk. Surgical intervention may be necessary if wire cannot pass blockage. - Scheduled angiogram with Dr. Wonda to assess for new blockage or stent issues. - Continue aspirin  and clopidogrel . - Discuss potential need for stronger antiplatelet therapy post-procedure based on findings.  Hyperlipidemia Cholesterol levels last checked in March of the previous year were satisfactory. - Ordered cholesterol check.  Informed Consent   Shared Decision Making/Informed Consent The risks [stroke (1 in 1000), death (1 in 1000), kidney failure [usually temporary] (1 in 500), bleeding (1 in 200), allergic reaction [possibly serious] (1 in 200)], benefits (diagnostic support and management of coronary artery disease) and alternatives of a cardiac catheterization were discussed in detail with Mr. Bushong and he is willing to proceed.      Signed, Newman JINNY Lawrence, MD  03/06/2024 1:45 PM    [facility]

## 2024-03-06 NOTE — Interval H&P Note (Signed)
 History and Physical Interval Note:  03/06/2024 1:53 PM  Jeremy [name]  has presented today for surgery, with the diagnosis of abnormal stress test.  The various methods of treatment have been discussed with the patient and family. After consideration of risks, benefits and other options for treatment, the patient has consented to  Procedures: LEFT HEART CATH AND CORONARY ANGIOGRAPHY (N/A) as a surgical intervention.  The patient's history has been reviewed, patient examined, no change in status, stable for surgery.  I have reviewed the patient's chart and labs.  Questions were answered to the patient's satisfaction.     Lilit Cinelli J Eluterio Seymour

## 2024-03-06 NOTE — Discharge Instructions (Signed)

## 2024-03-07 ENCOUNTER — Encounter (HOSPITAL_COMMUNITY): Payer: Self-pay | Admitting: Cardiology

## 2024-03-07 LAB — RENAL FUNCTION PANEL
Albumin: 4.4 g/dL (ref 3.5–5.0)
Anion gap: 11 (ref 5–15)
BUN: 12 mg/dL (ref 8–23)
CO2: 25 mmol/L (ref 22–32)
Calcium: 9.4 mg/dL (ref 8.9–10.3)
Chloride: 99 mmol/L (ref 98–111)
Creatinine, Ser: 0.84 mg/dL (ref 0.61–1.24)
GFR, Estimated: >60 mL/min
Glucose, Bld: 139 mg/dL — ABNORMAL HIGH (ref 70–99)
Phosphorus: 2.6 mg/dL (ref 2.5–4.6)
Potassium: 4.3 mmol/L (ref 3.5–5.1)
Sodium: 136 mmol/L (ref 135–145)

## 2024-03-07 LAB — CBC
HCT: 46 % (ref 39.0–52.0)
Hemoglobin: 15.6 g/dL (ref 13.0–17.0)
MCH: 31 pg (ref 26.0–34.0)
MCHC: 33.9 g/dL (ref 30.0–36.0)
MCV: 91.3 fL (ref 80.0–100.0)
Platelets: 212 10*3/uL (ref 150–400)
RBC: 5.04 MIL/uL (ref 4.22–5.81)
RDW: 13.7 % (ref 11.5–15.5)
WBC: 6.4 10*3/uL (ref 4.0–10.5)
nRBC: 0 % (ref 0.0–0.2)

## 2024-03-07 LAB — HEPARIN LEVEL (UNFRACTIONATED): Heparin Unfractionated: 0.15 [IU]/mL — ABNORMAL LOW (ref 0.30–0.70)

## 2024-03-07 LAB — MAGNESIUM: Magnesium: 2.2 mg/dL (ref 1.7–2.4)

## 2024-03-07 MED ORDER — ISOSORBIDE MONONITRATE ER 30 MG PO TB24
30.0000 mg | ORAL_TABLET | Freq: Every day | ORAL | Status: AC
  Administered 2024-03-07 – 2024-03-08 (×2): 30 mg via ORAL
  Filled 2024-03-07 (×2): qty 1

## 2024-03-07 MED ORDER — ASPIRIN 81 MG PO TBEC
81.0000 mg | DELAYED_RELEASE_TABLET | Freq: Every day | ORAL | Status: AC
  Administered 2024-03-07 – 2024-03-08 (×2): 81 mg via ORAL
  Filled 2024-03-07 (×2): qty 1

## 2024-03-07 MED ORDER — ROSUVASTATIN CALCIUM 20 MG PO TABS
40.0000 mg | ORAL_TABLET | Freq: Every day | ORAL | Status: AC
  Administered 2024-03-07 – 2024-03-08 (×2): 40 mg via ORAL
  Filled 2024-03-07 (×2): qty 2

## 2024-03-07 MED ORDER — EZETIMIBE 10 MG PO TABS
10.0000 mg | ORAL_TABLET | Freq: Every day | ORAL | Status: AC
  Administered 2024-03-07 – 2024-03-08 (×2): 10 mg via ORAL
  Filled 2024-03-07 (×2): qty 1

## 2024-03-07 MED ORDER — AMLODIPINE BESYLATE 5 MG PO TABS
5.0000 mg | ORAL_TABLET | Freq: Every day | ORAL | Status: AC
  Administered 2024-03-07 – 2024-03-08 (×2): 5 mg via ORAL
  Filled 2024-03-07 (×2): qty 1

## 2024-03-07 MED ORDER — HEPARIN (PORCINE) 25000 UT/250ML-% IV SOLN
1200.0000 [IU]/h | INTRAVENOUS | Status: AC
  Administered 2024-03-07: 1000 [IU]/h via INTRAVENOUS
  Administered 2024-03-08: 1200 [IU]/h via INTRAVENOUS
  Filled 2024-03-07 (×2): qty 250

## 2024-03-07 NOTE — Progress Notes (Signed)
 ANTICOAGULATION CONSULT NOTE  Pharmacy Consult for heparin  Indication: chest pain/ACS  Allergies[1]  Patient Measurements: Height: 5' 10 (177.8 cm) Weight: 80.9 kg (178 lb 5.6 oz) IBW/kg (Calculated) : 73 Heparin  Dosing Weight: 81 kg   Vital Signs: Temp: 98.4 F (36.9 C) (02/05 0755) Temp Source: Oral (02/05 0755) BP: 166/75 (02/05 0755) Pulse Rate: 55 (02/05 0755)  Labs: No results for input(s): HGB, HCT, PLT, APTT, LABPROT, INR, HEPARINUNFRC, HEPRLOWMOCWT, CREATININE, CKTOTAL, CKMB, TROPONINIHS in the last 72 hours.  Estimated Creatinine Clearance: 71.2 mL/min (by C-G formula based on SCr of 0.94 mg/dL).  Medical History: Past Medical History:  Diagnosis Date   Colon polyp 07/05/2007   TUBULAR ADENOMA   Coronary artery disease    Cardiac catheterization 8/07: Proximal LAD 50-75%, mid LAD 60%, ostial septal perforator 80%, proximal RCA 50% and then 80-85%, distal RCA 80-90%, EF 55-60%.  PCI was performed at that time colon distal RCA treated with a Taxus drug-eluting stent.  The proximal RCA treated with a Taxus drug eluting stent.  The LAD was studied with IVUS and treated with a Taxus drug eluding stent.     Hyperlipidemia    Ill-defined closed fractures of upper limb    Sinus bradycardia    sees Dr. Fernande    Syncope     Medications:  See MAR  Assessment: 1 yoM presents for elective LHC found to have mvCAD now planning CABG 2/11 pending plavix  washout (last dose 2/4 AM per patient).  Not on anticoagulation prior to admission.  Pharmacy consulted for heparin  dosing.  Baseline CBC stable - Hgb 15.6, Plt 212.   Goal of Therapy:  Heparin  level 0.3-0.7 units/ml Monitor platelets by anticoagulation protocol: Yes   Plan:  Heparin  infusion 1000 units/hr 8 hour anti-Xa level Daily CBC, anti-Xa level Monitor for s/sx of bleeding  Maurilio Fila, PharmD Clinical Pharmacist 03/07/2024  8:06 AM     [1] No Known Allergies

## 2024-03-07 NOTE — Progress Notes (Signed)
 ANTICOAGULATION CONSULT NOTE  Pharmacy Consult for heparin  Indication: chest pain/ACS  Allergies[1]  Patient Measurements: Height: 5' 10 (177.8 cm) Weight: 80.9 kg (178 lb 5.6 oz) IBW/kg (Calculated) : 73 Heparin  Dosing Weight: 81 kg   Vital Signs: Temp: 97.7 F (36.5 C) (02/05 1630) Temp Source: Oral (02/05 1630) BP: 137/68 (02/05 1630) Pulse Rate: 55 (02/05 1630)  Labs: Recent Labs    03/07/24 0837 03/07/24 1634  HGB 15.6  --   HCT 46.0  --   PLT 212  --   HEPARINUNFRC  --  0.15*  CREATININE 0.84  --     Estimated Creatinine Clearance: 79.7 mL/min (by C-G formula based on SCr of 0.84 mg/dL).  Assessment: Jeremy [name] presents for elective LHC found to have mvCAD now planning CABG 2/11 pending plavix  washout (last dose 2/4 AM per patient).  Not on anticoagulation prior to admission.  Pharmacy consulted for heparin  dosing.  Heparin  level 0.15 (subtherapeutic) on infusion at 1000 units/hr. No issues with line or bleeding reported per RN.  Goal of Therapy:  Heparin  level 0.3-0.7 units/ml Monitor platelets by anticoagulation protocol: Yes   Plan:  Increase heparin  infusion to 1200 units/hr 8 hour anti-Xa level  Vito Ralph, PharmD, BCPS Please see amion for complete clinical pharmacist phone list 03/07/2024  7:01 PM      [1] No Known Allergies

## 2024-03-07 NOTE — Progress Notes (Addendum)
 "  Progress Note  Patient Name: Jeremy [name] Date of Encounter: 03/07/2024 [facility] Cardiologist: Joelle VEAR Ren Donley, MD   Interval Summary    Feels well this morning, no issues over night.  Just notes feeling a little bit weak.    Vital Signs Vitals:   03/06/24 2040 03/06/24 2336 03/07/24 0340 03/07/24 0755  BP: (!) 140/63 (!) 144/62 (!) 143/71 (!) 166/75  Pulse: (!) 56 (!) 52 (!) 49 (!) 55  Resp: 19 20 19 20   Temp: 98.6 F (37 C) 97.9 F (36.6 C) 97.7 F (36.5 C) 98.4 F (36.9 C)  TempSrc: Oral Oral Oral Oral  SpO2: 97% 94% 95% 99%  Weight:      Height:        Intake/Output Summary (Last 24 hours) at 03/07/2024 1204 Last data filed at 03/07/2024 0756 Gross per 24 hour  Intake 358 ml  Output --  Net 358 ml      03/06/2024    8:14 PM 03/06/2024   11:25 AM 03/01/2024    9:53 AM  Last 3 Weights  Weight (lbs) 178 lb 5.6 oz 180 lb 182 lb 6.4 oz  Weight (kg) 80.9 kg 81.647 kg 82.736 kg     Cardiology Studies  Echocardiogram 02/23/2024: EF 60 to 65%.  No RWMA.  Mild AoV calcification.  Mean AVG 9 mm.  Normal RV and RVSP. Myoview  02/23/2024: HIGH RISK.  4 mm downsloping ST depressions during exercise EKG.  Medium size moderate perfusion defect in the apical anterior inferior and septal wall reversible.  EF estimated to be 33% out of proportion to the extent of expected disease. Cardiac cath 03/06/2024: Distal LM 95%, ostial LAD 95%.  Mid LAD 70% focal and 60% tandem lesions.  Proximal LAD stent widely patent.  Proximal LCx 60%.  Dominant RCA with 30% ISR in the proximal and mid stent and mid to distal stent 10% ISR.  Right lower collaterals from RPL to LAD septal.  (Images personally reviewed.)    Telemetry/ECG   Sinus bradycardia 50s - Personally Reviewed  Physical Exam  GEN: No acute distress.   Neck: No JVD Cardiac: RRR, no murmurs, rubs, or gallops.  Respiratory: Clear to auscultation bilaterally.  Nonlabored, good air movement. GI: Soft, nontender,  non-distended  MS: No edema VAS: right radial cath site stable-Allen's test normal.  Assessment & Plan   74 yo male with PMH of CAD PCI of LAD/RCA, HTN and bradycardia who presented for outpatient cardiac cath in the setting of abnormal stress test.   CAD (Left Main/Multivessel) Unstable Angina -- cardiac cath 2/4 with 95% LM--> 95%oLAD, 60% pLCx with right to left collaterals from the RPLA to LAD/septal. Seen by Dr. Daniel with recommendations for CABG. Needs plavix  wash out. CABG scheduled for 2/11.  -- continue IV heparin , ASA, statin, Imdur    HLD -- LDL 55, HDL 42 -- continue statin (increased rosuvastatin  to 40 mg), zetia  10 mg  HTN -- home hydrochlorothiazide  held, Bp elevated this morning. will add amlodipine  5mg  daily      Signed, Manuelita Rummer, NP    ATTENDING ATTESTATION  I have seen, examined and evaluated the patient this morning on rounds along with Manuelita Rummer, NP.  We conducted interviewed together along with examination.  The above documentation reflects our combined interview and examination.  After reviewing all the available data and chart, we discussed the patients laboratory, study & physical findings as well as symptoms in detail.  I agree with her findings,  examination as well as impression recommendations as per our discussion.    Attending adjustments noted in italics.   Highly significant distal LM into LAD disease as well as moderate to severe proximal LAD and moderate proximal LCx disease.  The severity left main is significant to the point where there is collateralization already from the right marginal branches to the left coronary artery.  He was really having progressive exertional dyspnea with some chest tightness mostly exertional but not at rest that led to a stress test that was read as abnormal and now referred for cath showing severe left main disease.  Plan is for CABG next week.  Based on the severity of left main disease, it was felt  prudent for the patient to remain in the hospital.  He has been started on IV heparin  as he is no longer on Plavix  (being held for CABG)  Hopefully he will remain stable.  The fact that he was not actively having chest pain was hemodynamically stable, balloon pump not placed. Okay to walk in the hallway but no vigorous activity.  Consensus decision between cardiac surgery, primary cardiologist and interventional team is at the patient says [facility] pending his CABG based on the severity of disease.    Alm MICAEL Clay, MD, MS Alm Clay, M.D., M.S. Interventional Cardiologist  [facility] Pager # [phone]     "

## 2024-03-07 NOTE — Plan of Care (Signed)
" °  Problem: Activity: Goal: Ability to return to baseline activity level will improve Outcome: Progressing   Problem: Education: Goal: Knowledge of General Education information will improve Description: Including pain rating scale, medication(s)/side effects and non-pharmacologic comfort measures Outcome: Progressing   Problem: Activity: Goal: Risk for activity intolerance will decrease Outcome: Progressing   Problem: Safety: Goal: Ability to remain free from injury will improve Outcome: Progressing   "

## 2024-03-08 LAB — RENAL FUNCTION PANEL
Albumin: 4.5 g/dL (ref 3.5–5.0)
Anion gap: 9 (ref 5–15)
BUN: 13 mg/dL (ref 8–23)
CO2: 28 mmol/L (ref 22–32)
Calcium: 9.2 mg/dL (ref 8.9–10.3)
Chloride: 100 mmol/L (ref 98–111)
Creatinine, Ser: 0.87 mg/dL (ref 0.61–1.24)
GFR, Estimated: >60 mL/min
Glucose, Bld: 88 mg/dL (ref 70–99)
Phosphorus: 3.2 mg/dL (ref 2.5–4.6)
Potassium: 4.1 mmol/L (ref 3.5–5.1)
Sodium: 137 mmol/L (ref 135–145)

## 2024-03-08 LAB — HEPARIN LEVEL (UNFRACTIONATED): Heparin Unfractionated: 0.41 [IU]/mL (ref 0.30–0.70)

## 2024-03-08 LAB — CBC
HCT: 44.1 % (ref 39.0–52.0)
Hemoglobin: 14.6 g/dL (ref 13.0–17.0)
MCH: 30.4 pg (ref 26.0–34.0)
MCHC: 33.1 g/dL (ref 30.0–36.0)
MCV: 91.9 fL (ref 80.0–100.0)
Platelets: 242 10*3/uL (ref 150–400)
RBC: 4.8 MIL/uL (ref 4.22–5.81)
RDW: 13.6 % (ref 11.5–15.5)
WBC: 7 10*3/uL (ref 4.0–10.5)
nRBC: 0 % (ref 0.0–0.2)

## 2024-03-08 LAB — VAS US DOPPLER PRE CABG

## 2024-03-08 NOTE — Progress Notes (Addendum)
"  °  Progress Note  Patient Name: Jeremy [name] Date of Encounter: 03/08/2024 [facility] Cardiologist: Joelle VEAR Ren Donley, MD   Interval Summary    Feels well this morning, no issues overnight.   Vital Signs Vitals:   03/07/24 1301 03/07/24 1630 03/07/24 2305 03/08/24 0810  BP: (!) 124/58 137/68 (!) 151/75 132/66  Pulse: (!) 55 (!) 55  (!) 52  Resp: 18 20 20 17   Temp: 97.6 F (36.4 C) 97.7 F (36.5 C) 97.6 F (36.4 C) 98.1 F (36.7 C)  TempSrc: Oral Oral Oral Oral  SpO2: 97% 97% 99% 97%  Weight:      Height:        Intake/Output Summary (Last 24 hours) at 03/08/2024 0839 Last data filed at 03/08/2024 0300 Gross per 24 hour  Intake 915.05 ml  Output --  Net 915.05 ml      03/06/2024    8:14 PM 03/06/2024   11:25 AM 03/01/2024    9:53 AM  Last 3 Weights  Weight (lbs) 178 lb 5.6 oz 180 lb 182 lb 6.4 oz  Weight (kg) 80.9 kg 81.647 kg 82.736 kg      Telemetry/ECG   Sinus bradycardia, 50s - Personally Reviewed  Physical Exam  GEN: No acute distress.   Neck: No JVD Cardiac: RRR, no murmurs, rubs, or gallops.  Respiratory: Clear to auscultation bilaterally. GI: Soft, nontender, non-distended  MS: No edema  Assessment & Plan   74 yo male with PMH of CAD PCI of LAD/RCA, HTN and bradycardia who presented for outpatient cardiac cath in the setting of abnormal stress test.    CAD (Left Main/Multivessel) Unstable Angina -- cardiac cath 2/4 with 95% LM--> 95%oLAD, 60% pLCx with right to left collaterals from the RPLA to LAD/septal. Seen by Dr. Daniel with recommendations for CABG. Needs plavix  wash out. CABG scheduled for 2/11.  -- continue IV heparin , ASA, statin, Imdur     HLD -- LDL 55, HDL 42 -- continue statin (increased rosuvastatin  to 40 mg), zetia  10 mg   HTN -- controlled, continue norvasc  5mg  daily       Signed, Manuelita Rummer, NP   ATTENDING ATTESTATION  I have seen, examined and evaluated the patient this morning on rounds along with Manuelita Rummer, NP.  After reviewing all the available data and chart, we discussed the patients laboratory, study & physical findings as well as symptoms in detail.  I agree with her findings, examination as well as impression recommendations as per our discussion.    Attending adjustments noted in italics.   Doing well.  No major complaints.  Anxiously awaiting CABG.  No further chest pain.  On stable medications.    Alm MICAEL Clay, MD, MS Alm Clay, M.D., M.S. Interventional Cardiologist  [facility] Pager # [phone]      For questions or updates, please contact  [facility] Please consult [email or web address] for contact info under  "

## 2024-03-08 NOTE — Progress Notes (Signed)
 ANTICOAGULATION CONSULT NOTE  Pharmacy Consult for heparin  Indication: chest pain/ACS  Allergies[1]  Patient Measurements: Height: 5' 10 (177.8 cm) Weight: 80.9 kg (178 lb 5.6 oz) IBW/kg (Calculated) : 73 Heparin  Dosing Weight: 81 kg   Vital Signs: Temp: 98.1 F (36.7 C) (02/06 0810) Temp Source: Oral (02/06 0810) BP: 132/66 (02/06 0810) Pulse Rate: 52 (02/06 0810)  Labs: Recent Labs    03/07/24 0837 03/07/24 1634 03/08/24 0245  HGB 15.6  --  14.6  HCT 46.0  --  44.1  PLT 212  --  242  HEPARINUNFRC  --  0.15* 0.41  CREATININE 0.84  --  0.87    Estimated Creatinine Clearance: 76.9 mL/min (by C-G formula based on SCr of 0.87 mg/dL).  Medical History: Past Medical History:  Diagnosis Date   Colon polyp 07/05/2007   TUBULAR ADENOMA   Coronary artery disease    Cardiac catheterization 8/07: Proximal LAD 50-75%, mid LAD 60%, ostial septal perforator 80%, proximal RCA 50% and then 80-85%, distal RCA 80-90%, EF 55-60%.  PCI was performed at that time colon distal RCA treated with a Taxus drug-eluting stent.  The proximal RCA treated with a Taxus drug eluting stent.  The LAD was studied with IVUS and treated with a Taxus drug eluding stent.     Hyperlipidemia    Ill-defined closed fractures of upper limb    Sinus bradycardia    sees Dr. Fernande    Syncope     Medications:  See MAR  Assessment: 69 yoM presents for elective LHC found to have mvCAD now planning CABG 2/11 pending plavix  washout (last dose 2/4 AM per patient).  Not on anticoagulation prior to admission.  Pharmacy consulted for heparin  dosing.  Baseline CBC stable - Hgb 14.6, Plt 242.   Goal of Therapy:  Heparin  level 0.3-0.7 units/ml Monitor platelets by anticoagulation protocol: Yes   Plan:  Heparin  infusion 1200 units/hr Daily CBC, anti-Xa level Monitor for s/sx of bleeding  Harlene Denna Berdine JONETTA, BCPS, [facility] Clinical Pharmacist  03/08/2024 10:05 AM   [facility] pharmacy phone numbers are listed on  [email or web address]       [1] No Known Allergies

## 2024-03-08 NOTE — Plan of Care (Signed)
 Patient walks laps in the hallway during his stay, wife at bedside .     Problem: Skin Integrity: Goal: Risk for impaired skin integrity will decrease Outcome: Completed/Met   Problem: Activity: Goal: Risk for activity intolerance will decrease Outcome: Progressing

## 2024-03-13 ENCOUNTER — Encounter (HOSPITAL_COMMUNITY): Admission: RE | Payer: Self-pay | Source: Home / Self Care

## 2024-03-18 ENCOUNTER — Ambulatory Visit

## 2024-06-03 ENCOUNTER — Ambulatory Visit
